# Patient Record
Sex: Female | Born: 1935 | Race: Black or African American | Hispanic: No | Smoking: Never smoker
Health system: Southern US, Community
[De-identification: ages and names within clinical notes are randomized; demographics above are authoritative.]

## PROBLEM LIST (undated history)

## (undated) DIAGNOSIS — I2510 Atherosclerotic heart disease of native coronary artery without angina pectoris: Secondary | ICD-10-CM

## (undated) DIAGNOSIS — I517 Cardiomegaly: Secondary | ICD-10-CM

## (undated) DIAGNOSIS — T4145XA Adverse effect of unspecified anesthetic, initial encounter: Secondary | ICD-10-CM

## (undated) DIAGNOSIS — T8859XA Other complications of anesthesia, initial encounter: Secondary | ICD-10-CM

## (undated) DIAGNOSIS — J45909 Unspecified asthma, uncomplicated: Secondary | ICD-10-CM

## (undated) DIAGNOSIS — R0902 Hypoxemia: Secondary | ICD-10-CM

## (undated) DIAGNOSIS — Z8679 Personal history of other diseases of the circulatory system: Secondary | ICD-10-CM

## (undated) DIAGNOSIS — R011 Cardiac murmur, unspecified: Secondary | ICD-10-CM

## (undated) DIAGNOSIS — M1990 Unspecified osteoarthritis, unspecified site: Secondary | ICD-10-CM

## (undated) DIAGNOSIS — D649 Anemia, unspecified: Secondary | ICD-10-CM

## (undated) DIAGNOSIS — I509 Heart failure, unspecified: Secondary | ICD-10-CM

## (undated) DIAGNOSIS — E039 Hypothyroidism, unspecified: Secondary | ICD-10-CM

## (undated) DIAGNOSIS — Z9289 Personal history of other medical treatment: Secondary | ICD-10-CM

## (undated) DIAGNOSIS — J811 Chronic pulmonary edema: Secondary | ICD-10-CM

## (undated) DIAGNOSIS — K219 Gastro-esophageal reflux disease without esophagitis: Secondary | ICD-10-CM

## (undated) DIAGNOSIS — R0600 Dyspnea, unspecified: Secondary | ICD-10-CM

## (undated) DIAGNOSIS — N189 Chronic kidney disease, unspecified: Secondary | ICD-10-CM

## (undated) DIAGNOSIS — I10 Essential (primary) hypertension: Secondary | ICD-10-CM

## (undated) DIAGNOSIS — C140 Malignant neoplasm of pharynx, unspecified: Secondary | ICD-10-CM

## (undated) DIAGNOSIS — R0602 Shortness of breath: Secondary | ICD-10-CM

## (undated) DIAGNOSIS — I272 Pulmonary hypertension, unspecified: Secondary | ICD-10-CM

## (undated) DIAGNOSIS — I351 Nonrheumatic aortic (valve) insufficiency: Secondary | ICD-10-CM

## (undated) DIAGNOSIS — I639 Cerebral infarction, unspecified: Secondary | ICD-10-CM

## (undated) DIAGNOSIS — J189 Pneumonia, unspecified organism: Secondary | ICD-10-CM

## (undated) DIAGNOSIS — R6510 Systemic inflammatory response syndrome (SIRS) of non-infectious origin without acute organ dysfunction: Secondary | ICD-10-CM

## (undated) HISTORY — DX: Atherosclerotic heart disease of native coronary artery without angina pectoris: I25.10

## (undated) HISTORY — DX: Personal history of other diseases of the circulatory system: Z86.79

## (undated) HISTORY — PX: JOINT REPLACEMENT: SHX530

## (undated) HISTORY — DX: Pulmonary hypertension, unspecified: I27.20

## (undated) HISTORY — DX: Chronic pulmonary edema: J81.1

## (undated) HISTORY — DX: Dyspnea, unspecified: R06.00

## (undated) HISTORY — DX: Shortness of breath: R06.02

## (undated) HISTORY — DX: Cardiomegaly: I51.7

## (undated) HISTORY — DX: Nonrheumatic aortic (valve) insufficiency: I35.1

## (undated) HISTORY — DX: Hypoxemia: R09.02

## (undated) HISTORY — PX: REPLACEMENT TOTAL KNEE: SUR1224

## (undated) HISTORY — DX: Essential (primary) hypertension: I10

## (undated) HISTORY — DX: Heart failure, unspecified: I50.9

## (undated) HISTORY — DX: Hypothyroidism, unspecified: E03.9

## (undated) HISTORY — DX: Cerebral infarction, unspecified: I63.9

---
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
## 1978-10-17 DIAGNOSIS — I639 Cerebral infarction, unspecified: Secondary | ICD-10-CM

## 1978-10-17 HISTORY — PX: KNEE ARTHROSCOPY: SHX127

## 1978-10-17 HISTORY — PX: HAMMER TOE SURGERY: SHX385

## 1978-10-17 HISTORY — DX: Cerebral infarction, unspecified: I63.9

## 1988-10-16 HISTORY — PX: CORONARY ANGIOPLASTY: SHX604

## 1996-02-16 HISTORY — PX: THYROIDECTOMY, PARTIAL: SHX18

## 1998-02-15 DIAGNOSIS — C140 Malignant neoplasm of pharynx, unspecified: Secondary | ICD-10-CM

## 1998-02-15 HISTORY — DX: Malignant neoplasm of pharynx, unspecified: C14.0

## 1998-05-15 ENCOUNTER — Other Ambulatory Visit: Admission: RE | Admit: 1998-05-15 | Discharge: 1998-05-15 | Payer: Self-pay | Admitting: Otolaryngology

## 1998-05-26 ENCOUNTER — Encounter: Admission: RE | Admit: 1998-05-26 | Discharge: 1998-08-24 | Payer: Self-pay | Admitting: Radiation Oncology

## 1998-05-26 ENCOUNTER — Encounter: Payer: Self-pay | Admitting: Radiation Oncology

## 1998-05-27 ENCOUNTER — Encounter (HOSPITAL_COMMUNITY): Admission: RE | Admit: 1998-05-27 | Discharge: 1998-08-25 | Payer: Self-pay | Admitting: Dentistry

## 1998-06-17 ENCOUNTER — Inpatient Hospital Stay (HOSPITAL_COMMUNITY): Admission: EM | Admit: 1998-06-17 | Discharge: 1998-06-20 | Payer: Self-pay | Admitting: Emergency Medicine

## 1998-06-17 ENCOUNTER — Encounter: Payer: Self-pay | Admitting: Emergency Medicine

## 1998-06-19 ENCOUNTER — Encounter (HOSPITAL_BASED_OUTPATIENT_CLINIC_OR_DEPARTMENT_OTHER): Payer: Self-pay | Admitting: Internal Medicine

## 1998-07-13 ENCOUNTER — Emergency Department (HOSPITAL_COMMUNITY): Admission: EM | Admit: 1998-07-13 | Discharge: 1998-07-13 | Payer: Self-pay | Admitting: Emergency Medicine

## 1998-07-13 ENCOUNTER — Encounter: Payer: Self-pay | Admitting: Emergency Medicine

## 1999-01-17 ENCOUNTER — Emergency Department (HOSPITAL_COMMUNITY): Admission: EM | Admit: 1999-01-17 | Discharge: 1999-01-17 | Payer: Self-pay | Admitting: Emergency Medicine

## 1999-01-17 ENCOUNTER — Encounter: Payer: Self-pay | Admitting: Emergency Medicine

## 1999-01-27 ENCOUNTER — Encounter: Admission: RE | Admit: 1999-01-27 | Discharge: 1999-04-27 | Payer: Self-pay | Admitting: Radiation Oncology

## 1999-05-26 ENCOUNTER — Encounter: Admission: RE | Admit: 1999-05-26 | Discharge: 1999-08-24 | Payer: Self-pay | Admitting: Radiation Oncology

## 1999-06-01 ENCOUNTER — Encounter: Payer: Self-pay | Admitting: Endocrinology

## 1999-06-01 ENCOUNTER — Encounter: Admission: RE | Admit: 1999-06-01 | Discharge: 1999-06-01 | Payer: Self-pay | Admitting: Endocrinology

## 2000-12-13 ENCOUNTER — Ambulatory Visit: Admission: RE | Admit: 2000-12-13 | Discharge: 2001-03-13 | Payer: Self-pay | Admitting: Radiation Oncology

## 2001-05-09 ENCOUNTER — Ambulatory Visit: Admission: RE | Admit: 2001-05-09 | Discharge: 2001-08-07 | Payer: Self-pay | Admitting: Radiation Oncology

## 2002-05-08 ENCOUNTER — Ambulatory Visit: Admission: RE | Admit: 2002-05-08 | Discharge: 2002-05-08 | Payer: Self-pay | Admitting: Radiation Oncology

## 2002-09-12 ENCOUNTER — Encounter: Admission: RE | Admit: 2002-09-12 | Discharge: 2002-09-12 | Payer: Self-pay | Admitting: Dentistry

## 2002-11-13 ENCOUNTER — Ambulatory Visit (HOSPITAL_COMMUNITY): Admission: RE | Admit: 2002-11-13 | Discharge: 2002-11-13 | Payer: Self-pay | Admitting: Radiation Oncology

## 2002-11-20 ENCOUNTER — Encounter: Payer: Self-pay | Admitting: Emergency Medicine

## 2002-11-20 ENCOUNTER — Emergency Department (HOSPITAL_COMMUNITY): Admission: EM | Admit: 2002-11-20 | Discharge: 2002-11-20 | Payer: Self-pay | Admitting: Emergency Medicine

## 2003-02-16 HISTORY — PX: CATARACT EXTRACTION W/ INTRAOCULAR LENS IMPLANT: SHX1309

## 2003-05-14 ENCOUNTER — Ambulatory Visit: Admission: RE | Admit: 2003-05-14 | Discharge: 2003-05-14 | Payer: Self-pay | Admitting: Radiation Oncology

## 2003-11-12 ENCOUNTER — Ambulatory Visit: Admission: RE | Admit: 2003-11-12 | Discharge: 2003-11-12 | Payer: Self-pay | Admitting: Radiation Oncology

## 2003-11-19 ENCOUNTER — Ambulatory Visit: Admission: RE | Admit: 2003-11-19 | Discharge: 2003-11-19 | Payer: Self-pay | Admitting: Radiation Oncology

## 2004-03-16 ENCOUNTER — Emergency Department (HOSPITAL_COMMUNITY): Admission: EM | Admit: 2004-03-16 | Discharge: 2004-03-16 | Payer: Self-pay | Admitting: Emergency Medicine

## 2004-11-18 ENCOUNTER — Encounter: Admission: RE | Admit: 2004-11-18 | Discharge: 2004-11-18 | Payer: Self-pay | Admitting: Family Medicine

## 2005-02-25 ENCOUNTER — Encounter: Admission: RE | Admit: 2005-02-25 | Discharge: 2005-02-25 | Payer: Self-pay | Admitting: *Deleted

## 2005-08-20 ENCOUNTER — Emergency Department (HOSPITAL_COMMUNITY): Admission: EM | Admit: 2005-08-20 | Discharge: 2005-08-20 | Payer: Self-pay | Admitting: Emergency Medicine

## 2005-09-05 ENCOUNTER — Emergency Department (HOSPITAL_COMMUNITY): Admission: EM | Admit: 2005-09-05 | Discharge: 2005-09-05 | Payer: Self-pay | Admitting: Emergency Medicine

## 2005-09-07 ENCOUNTER — Emergency Department (HOSPITAL_COMMUNITY): Admission: EM | Admit: 2005-09-07 | Discharge: 2005-09-07 | Payer: Self-pay | Admitting: Emergency Medicine

## 2005-09-10 ENCOUNTER — Ambulatory Visit (HOSPITAL_BASED_OUTPATIENT_CLINIC_OR_DEPARTMENT_OTHER): Admission: RE | Admit: 2005-09-10 | Discharge: 2005-09-10 | Payer: Self-pay | Admitting: Otolaryngology

## 2005-12-07 ENCOUNTER — Inpatient Hospital Stay (HOSPITAL_COMMUNITY): Admission: EM | Admit: 2005-12-07 | Discharge: 2005-12-09 | Payer: Self-pay | Admitting: Emergency Medicine

## 2005-12-07 ENCOUNTER — Encounter (INDEPENDENT_AMBULATORY_CARE_PROVIDER_SITE_OTHER): Payer: Self-pay | Admitting: Cardiology

## 2005-12-07 ENCOUNTER — Encounter: Payer: Self-pay | Admitting: Vascular Surgery

## 2006-09-14 ENCOUNTER — Observation Stay (HOSPITAL_COMMUNITY): Admission: EM | Admit: 2006-09-14 | Discharge: 2006-09-15 | Payer: Self-pay | Admitting: Emergency Medicine

## 2006-11-11 ENCOUNTER — Ambulatory Visit: Payer: Self-pay | Admitting: Pulmonary Disease

## 2006-11-16 DIAGNOSIS — Z8679 Personal history of other diseases of the circulatory system: Secondary | ICD-10-CM | POA: Insufficient documentation

## 2006-11-16 DIAGNOSIS — I10 Essential (primary) hypertension: Secondary | ICD-10-CM | POA: Insufficient documentation

## 2006-11-16 DIAGNOSIS — J309 Allergic rhinitis, unspecified: Secondary | ICD-10-CM | POA: Insufficient documentation

## 2006-11-16 DIAGNOSIS — I499 Cardiac arrhythmia, unspecified: Secondary | ICD-10-CM | POA: Insufficient documentation

## 2006-11-16 DIAGNOSIS — C801 Malignant (primary) neoplasm, unspecified: Secondary | ICD-10-CM

## 2006-12-02 ENCOUNTER — Ambulatory Visit: Payer: Self-pay | Admitting: Pulmonary Disease

## 2009-09-09 ENCOUNTER — Inpatient Hospital Stay (HOSPITAL_COMMUNITY): Admission: EM | Admit: 2009-09-09 | Discharge: 2009-09-12 | Payer: Self-pay | Admitting: Emergency Medicine

## 2009-09-11 ENCOUNTER — Encounter (INDEPENDENT_AMBULATORY_CARE_PROVIDER_SITE_OTHER): Payer: Self-pay | Admitting: Internal Medicine

## 2009-09-11 ENCOUNTER — Ambulatory Visit: Payer: Self-pay | Admitting: Internal Medicine

## 2009-09-15 ENCOUNTER — Ambulatory Visit: Payer: Self-pay | Admitting: Internal Medicine

## 2009-09-15 DIAGNOSIS — K219 Gastro-esophageal reflux disease without esophagitis: Secondary | ICD-10-CM

## 2009-09-15 DIAGNOSIS — K222 Esophageal obstruction: Secondary | ICD-10-CM

## 2009-09-15 DIAGNOSIS — I6350 Cerebral infarction due to unspecified occlusion or stenosis of unspecified cerebral artery: Secondary | ICD-10-CM | POA: Insufficient documentation

## 2009-09-15 DIAGNOSIS — E039 Hypothyroidism, unspecified: Secondary | ICD-10-CM | POA: Insufficient documentation

## 2009-09-15 DIAGNOSIS — I509 Heart failure, unspecified: Secondary | ICD-10-CM | POA: Insufficient documentation

## 2009-09-15 DIAGNOSIS — R1319 Other dysphagia: Secondary | ICD-10-CM

## 2009-09-15 DIAGNOSIS — I2510 Atherosclerotic heart disease of native coronary artery without angina pectoris: Secondary | ICD-10-CM | POA: Insufficient documentation

## 2009-09-15 DIAGNOSIS — K5730 Diverticulosis of large intestine without perforation or abscess without bleeding: Secondary | ICD-10-CM | POA: Insufficient documentation

## 2009-09-29 ENCOUNTER — Ambulatory Visit: Payer: Self-pay | Admitting: Cardiovascular Disease

## 2009-12-12 ENCOUNTER — Encounter (INDEPENDENT_AMBULATORY_CARE_PROVIDER_SITE_OTHER): Payer: Self-pay | Admitting: *Deleted

## 2009-12-12 ENCOUNTER — Ambulatory Visit: Payer: Self-pay | Admitting: Cardiovascular Disease

## 2010-01-29 ENCOUNTER — Emergency Department (HOSPITAL_COMMUNITY)
Admission: EM | Admit: 2010-01-29 | Discharge: 2010-01-29 | Payer: Self-pay | Source: Home / Self Care | Admitting: Emergency Medicine

## 2010-02-17 ENCOUNTER — Inpatient Hospital Stay (HOSPITAL_COMMUNITY)
Admission: EM | Admit: 2010-02-17 | Discharge: 2010-02-21 | Payer: Self-pay | Source: Home / Self Care | Attending: Internal Medicine | Admitting: Internal Medicine

## 2010-02-18 ENCOUNTER — Ambulatory Visit: Payer: Self-pay | Admitting: Cardiology

## 2010-02-18 LAB — BASIC METABOLIC PANEL
BUN: 10 mg/dL (ref 6–23)
BUN: 15 mg/dL (ref 6–23)
CO2: 35 mEq/L — ABNORMAL HIGH (ref 19–32)
CO2: 33 mEq/L — ABNORMAL HIGH (ref 19–32)
Calcium: 9.4 mg/dL (ref 8.4–10.5)
Calcium: 9.4 mg/dL (ref 8.4–10.5)
Chloride: 99 mEq/L (ref 96–112)
Chloride: 94 mEq/L — ABNORMAL LOW (ref 96–112)
Creatinine, Ser: 0.96 mg/dL (ref 0.4–1.2)
Creatinine, Ser: 1.29 mg/dL — ABNORMAL HIGH (ref 0.4–1.2)
GFR calc Af Amer: >60 mL/min (ref >60)
GFR calc Af Amer: 47 mL/min — ABNORMAL LOW (ref >60)
GFR calc non Af Amer: 54 mL/min — ABNORMAL LOW (ref >60)
GFR calc non Af Amer: 39 mL/min — ABNORMAL LOW (ref >60)
Glucose, Bld: 83 mg/dL (ref 70–99)
Glucose, Bld: 111 mg/dL — ABNORMAL HIGH (ref 70–99)
Potassium: 3.8 mEq/L (ref 3.5–5.1)
Potassium: 3.7 mEq/L (ref 3.5–5.1)
Sodium: 142 mEq/L (ref 135–145)
Sodium: 138 mEq/L (ref 135–145)

## 2010-02-18 LAB — CARDIAC PANEL(CRET KIN+CKTOT+MB+TROPI)
CK, MB: 1.4 ng/mL (ref 0.3–4.0)
CK, MB: 1.1 ng/mL (ref 0.3–4.0)
Relative Index: INVALID (ref 0.0–2.5)
Relative Index: INVALID (ref 0.0–2.5)
Total CK: 84 U/L (ref 7–177)
Total CK: 76 U/L (ref 7–177)
Troponin I: 0.04 ng/mL (ref 0.00–0.06)
Troponin I: 0.03 ng/mL (ref 0.00–0.06)

## 2010-02-18 LAB — PHOSPHORUS: Phosphorus: 3 mg/dL (ref 2.3–4.6)

## 2010-02-18 LAB — MAGNESIUM: Magnesium: 2 mg/dL (ref 1.5–2.5)

## 2010-02-19 LAB — BASIC METABOLIC PANEL
BUN: 16 mg/dL (ref 6–23)
CO2: 35 mEq/L — ABNORMAL HIGH (ref 19–32)
Calcium: 9.1 mg/dL (ref 8.4–10.5)
Chloride: 98 mEq/L (ref 96–112)
Creatinine, Ser: 1.42 mg/dL — ABNORMAL HIGH (ref 0.4–1.2)
GFR calc Af Amer: 42 mL/min — ABNORMAL LOW (ref >60)
GFR calc non Af Amer: 35 mL/min — ABNORMAL LOW (ref >60)
Glucose, Bld: 86 mg/dL (ref 70–99)
Potassium: 3.6 mEq/L (ref 3.5–5.1)
Sodium: 140 mEq/L (ref 135–145)

## 2010-02-19 LAB — BRAIN NATRIURETIC PEPTIDE: Pro B Natriuretic peptide (BNP): 541 pg/mL — ABNORMAL HIGH (ref 0.0–100.0)

## 2010-02-19 LAB — CBC
HCT: 27.5 % — ABNORMAL LOW (ref 36.0–46.0)
Hemoglobin: 8.6 g/dL — ABNORMAL LOW (ref 12.0–15.0)
MCH: 27.7 pg (ref 26.0–34.0)
MCHC: 31.3 g/dL (ref 30.0–36.0)
MCV: 88.7 fL (ref 78.0–100.0)
Platelets: 239 10*3/uL (ref 150–400)
RBC: 3.1 MIL/uL — ABNORMAL LOW (ref 3.87–5.11)
RDW: 14 % (ref 11.5–15.5)
WBC: 6.7 10*3/uL (ref 4.0–10.5)

## 2010-02-20 LAB — BASIC METABOLIC PANEL
BUN: 17 mg/dL (ref 6–23)
CO2: 35 mEq/L — ABNORMAL HIGH (ref 19–32)
Calcium: 9.4 mg/dL (ref 8.4–10.5)
Chloride: 97 mEq/L (ref 96–112)
Creatinine, Ser: 1.22 mg/dL — ABNORMAL HIGH (ref 0.4–1.2)
GFR calc Af Amer: 50 mL/min — ABNORMAL LOW (ref >60)
GFR calc non Af Amer: 41 mL/min — ABNORMAL LOW (ref >60)
Glucose, Bld: 113 mg/dL — ABNORMAL HIGH (ref 70–99)
Potassium: 3.8 mEq/L (ref 3.5–5.1)
Sodium: 141 mEq/L (ref 135–145)

## 2010-03-02 LAB — BASIC METABOLIC PANEL
BUN: 21 mg/dL (ref 6–23)
CO2: 32 mEq/L (ref 19–32)
Calcium: 8.9 mg/dL (ref 8.4–10.5)
Chloride: 97 mEq/L (ref 96–112)
Creatinine, Ser: 1.2 mg/dL (ref 0.4–1.2)
GFR calc Af Amer: 51 mL/min — ABNORMAL LOW (ref >60)
GFR calc non Af Amer: 42 mL/min — ABNORMAL LOW (ref >60)
Glucose, Bld: 74 mg/dL (ref 70–99)
Potassium: 3.6 mEq/L (ref 3.5–5.1)
Sodium: 136 mEq/L (ref 135–145)

## 2010-03-17 ENCOUNTER — Ambulatory Visit: Payer: Self-pay | Admitting: Cardiovascular Disease

## 2010-03-19 NOTE — Miscellaneous (Signed)
Summary: dx correction  Clinical Lists Changes  Problems: Changed problem from CONGESTIVE HEART FAILURE (ICD-428.00) to CONGESTIVE HEART FAILURE (ICD-428.0) changed the incorrect dx code to correct dx code Genella Mech  December 12, 2009 1:47 PM

## 2010-03-19 NOTE — Assessment & Plan Note (Signed)
Summary: post. hosp/ poss. dil.   History of Present Illness Visit Type: new patient  Primary GI MD: Yancey Flemings MD Primary Provider: Lupe Carney, MD  Requesting Provider: na Chief Complaint: Dysphagia, GERD, belching, loss of appetite, nausea, change in bowel habits, and diarrhea  History of Present Illness:   77 Y.O FEMALE WHO IS NEW TO GI. SHE WAS SEEN IN CONSULTATION WHILE HOSPITALIZED LAST WEEK AT [facility]. SHE WAS ADMITTED WITH DYSPNEA AND TREATED FOR CHF,PNEUMONIA, AND HAS NEW DX OF PULMONARY HTN.. SHE HAS A REMOTE HX OF NASOPHARNGEAL CANCER 12 YEARS AGO FOR WHICH SHE HAD RADIATION TO HER FACE AND NECK. SHE HAS HAD DYSPHAGIA SXS FOR SEVERAL YEARS,PROGRESSIVE OVER THE PAST COUPLE YEARS. SXS ARE NOTICEABLE WITH PILLS  AND MEATS PARTICULARLY. NO DIFFICULTY WITH LIQUIDS UNLESS SHE DRINKS LIQUDS TO TRY TO PUSH SOME SOLIDS DOWN-WILL GURGLE BACK UP. INFREQUENT HEARTBURN INDIGESTION. NO C/O ABDOMINAL PAIN,APPETITE OK.  SHE HAS DAILY SXS,NOT EVERY MEAL. SHE HAD A BARIUM SWALLOW DONE 7/28 WHICH SHOWED A DISTAL ESOPHAGEAL STRICTURE,BARIUM TABLET DID NOT SPONTANEOUSLY PASS.  SHE WAS ANTICOAGULATED DURING HER STAY AND ON O2 SO ELECTED NOT TO PURSUE DILATION LAST WEEK. SHE WAS DISCHARGED 09/12/09. SHE IS STILL ON 2L/O2 ,AND FINISHING HER ANTIBIOTICS. NOT TAKING CLINDAMYCIN  REGULARLY AS IT GETS STUCK IN ESOPHAGUS.   GI Review of Systems    Reports acid reflux and  dysphagia with solids.      Denies abdominal pain, belching, bloating, chest pain, dysphagia with liquids, heartburn, loss of appetite, nausea, vomiting, vomiting blood, and  weight loss.        Denies anal fissure, black tarry stools, change in bowel habit, constipation, diarrhea, diverticulosis, fecal incontinence, heme positive stool, hemorrhoids, irritable bowel syndrome, jaundice, light color stool, liver problems, rectal bleeding, and  rectal pain.    Current Medications (verified): 1)  Synthroid 175 Mcg Tabs (Levothyroxine Sodium) ....  One Tablet By Mouth Once Daily 2)  Temazepam 30 Mg  Caps (Temazepam) .... Take 1 Capsule By Mouth Once A Day 3)  Avelox 400 Mg Tabs (Moxifloxacin Hcl) .... One Tablet By Mouth Once Daily For Five Days 4)  Aspirin 81 Mg Chew (Aspirin) .... One By Mouth Once Daily 5)  Clindamycin Hcl 300 Mg Caps (Clindamycin Hcl) .... One Tablet By Mouth Every Eight Hours For Five Days 6)  Furosemide 40 Mg Tabs (Furosemide) .... One Tablet By Mouth Once Daily 7)  Lisinopril 5 Mg Tabs (Lisinopril) .... One Tablet By Mouth Once Daily For One Month 8)  Aleve 220 Mg Tabs (Naproxen Sodium) .... Two Tablets By Mouth Once Daily in The Morning 9)  Afrin Nasal Spray 0.05 % Soln (Oxymetazoline Hcl) .... Two Times A Day As Needed 10)  Metoprolol Tartrate 25 Mg Tabs (Metoprolol Tartrate) .... 1/2 Tablet By Mouth Two Times A Day 11)  Alprazolam 0.5 Mg Tabs (Alprazolam) .... 1/2 To 1 Tablet By Mouth Three Times A Day 12)  Nifedical Xl 60 Mg Xr24h-Tab (Nifedipine) .... One Tablet By Mouth Once Daily 13)  Multivitamins   Tabs (Multiple Vitamin) .... One Tablet By Mouth Once Daily  Allergies (verified): 1)  ! Percocet 2)  * Lovenox 3)  Percocet 4)  * Anesthesia 5)  * Red Dye  Past History:  Past Medical History: Allergic rhinitis Hypertension Cerebrovascular accident, hx of CAD Anemia Congestive Heart Failure/PULM HTN Colon Polyps Diverticulosis Esophageal Stricture DX 7/11 Thyroid Disease  Pneumonia Seizures HX OF NASOPHARYNGEAL CANCER 2000- S/P RADIATION  Past Surgical History: Angioplasty/stent Knee Surgery  Foot Surgery   Family History: Family History of Colon Cancer:Brother, Maternal Nephew  Family History of Heart Disease: Mother   Social History: Retired Widowed 10 Childern  Patient has never smoked.  Alcohol Use - no Illicit Drug Use - no Drug Use:  no  Review of Systems       The patient complains of anxiety-new, arthritis/joint pain, cough, fainting, fatigue, headaches-new, hearing  problems, heart rhythm changes, itching, shortness of breath, skin rash, sleeping problems, swelling of feet/legs, urination - excessive, urine leakage, and voice change.  The patient denies allergy/sinus, anemia, back pain, blood in urine, breast changes/lumps, change in vision, confusion, coughing up blood, depression-new, fever, heart murmur, menstrual pain, muscle pains/cramps, night sweats, nosebleeds, pregnancy symptoms, sore throat, swollen lymph glands, thirst - excessive, urination changes/pain, and vision changes.         ROS OTHERWISE AS IN HPI  Vital Signs:  Patient profile:   77 year old female Height:      62 inches Weight:      143 pounds BMI:     26.25 BSA:     1.66 Pulse rate:   80 / minute Pulse rhythm:   regular BP sitting:   132 / 80  (left arm) Cuff size:   regular  Vitals Entered By: Ok Anis CMA (September 15, 2009 2:29 PM)  Physical Exam  General:  Well developed, well nourished, no acute distress.,ELDERLY Head:  Normocephalic and atraumatic. Eyes:  PERRLA, no icterus. Neck:  Supple; no masses or thyromegaly. Lungs:  Clear throughout to auscultation. Heart:  Regular rate and rhythm; no murmurs, rubs,  or bruits.heart murmur systolic:.   Abdomen:  SOFT, NONTENDER, NO MASS OR HSM,BS+ Rectal:  NOT DONE Extremities:  No clubbing, cyanosis, edema or deformities noted. Neurologic:  Alert and  oriented x4;  grossly normal neurologically. Psych:  Alert and cooperative. Normal mood and affect.   Impression & Recommendations:  Problem # 1:  ESOPHAGEAL STRICTURE (ICD-530.3) Assessment New 77 Y.O FEMALE  WITH SOLID FOOD DYSPHAGIA, AND NEWLY DX  DISTAL ESOPHAGEAL STRICTURE.   SHE WILL NEED EGD WITH SAVARY DILATION OF ESOPHAGUS WITH DR. PERRY-HOWEVER SHE WAS JUST DISCHARGED FROM THE HOSPITAL 09/12/09 AND IS RECOVERING FROM PNEUMONIA AND CHF (STILL ON 02).WILL REASSESS IN 3-4 WEEKS BY PHONE AND IF IMPROVED AT THAT TIME THEN SCHEDULE FOR PROCEDURE.   ADD TRIAL OF  ACIPHEX 20 MG DAILY FOR REFLUX SXS,MAY HELP IF ANY COMPONENT OF ESOPHAGITIS CONTRIBUTING TO HER SXS. SAMPLES TODAY.  Problem # 2:  CVA-STROKE (ICD-434.91) Assessment: Comment Only  Problem # 3:  CONGESTIVE HEART FAILURE (ICD-428.00) Assessment: Comment Only  Problem # 4:  CORONARY ARTERY DISEASE (ICD-414.00) Assessment: Comment Only  Problem # 5:  DIVERTICULOSIS-COLON (ICD-562.10) Assessment: Comment Only  Problem # 6:  CARCINOMA (ICD-199.1) Assessment: Comment Only HX OF NASOPHARYNGEAL CANCER-2000'-  S/P RADIATION  Patient Instructions: 1)  We will call you when we have the Endoscopy scheduled at  the hospital. 2)  Take 1 tab of Aciphex in the morning. We will call you when we have more samples.  3)  Copy sent to : Dr. Lupe Carney 4)  The medication list was reviewed and reconciled.  All changed / newly prescribed medications were explained.  A complete medication list was provided to the patient / caregiver.

## 2010-04-27 LAB — DIFFERENTIAL
Basophils Absolute: 0 10*3/uL (ref 0.0–0.1)
Basophils Relative: 1 % (ref 0–1)
Eosinophils Absolute: 0.2 10*3/uL (ref 0.0–0.7)
Eosinophils Relative: 3 % (ref 0–5)
Lymphocytes Relative: 12 % (ref 12–46)
Lymphs Abs: 0.8 10*3/uL (ref 0.7–4.0)
Monocytes Absolute: 0.4 10*3/uL (ref 0.1–1.0)
Monocytes Relative: 7 % (ref 3–12)
Neutro Abs: 5.1 10*3/uL (ref 1.7–7.7)
Neutrophils Relative %: 78 % — ABNORMAL HIGH (ref 43–77)

## 2010-04-27 LAB — CBC
HCT: 29.5 % — ABNORMAL LOW (ref 36.0–46.0)
Hemoglobin: 9 g/dL — ABNORMAL LOW (ref 12.0–15.0)
MCH: 27.7 pg (ref 26.0–34.0)
MCHC: 30.5 g/dL (ref 30.0–36.0)
MCV: 90.8 fL (ref 78.0–100.0)
Platelets: 249 10*3/uL (ref 150–400)
Platelets: 264 10*3/uL (ref 150–400)
RBC: 3.25 MIL/uL — ABNORMAL LOW (ref 3.87–5.11)
RBC: 3.66 MIL/uL — ABNORMAL LOW (ref 3.87–5.11)
RDW: 13.7 % (ref 11.5–15.5)
RDW: 13.8 % (ref 11.5–15.5)
WBC: 6.6 10*3/uL (ref 4.0–10.5)
WBC: 6.7 10*3/uL (ref 4.0–10.5)

## 2010-04-27 LAB — URINALYSIS, ROUTINE W REFLEX MICROSCOPIC
Bilirubin Urine: NEGATIVE
Glucose, UA: NEGATIVE mg/dL
Hgb urine dipstick: NEGATIVE
Hgb urine dipstick: NEGATIVE
Ketones, ur: NEGATIVE mg/dL
Nitrite: NEGATIVE
Nitrite: NEGATIVE
Protein, ur: NEGATIVE mg/dL
Specific Gravity, Urine: 1.008 (ref 1.005–1.030)
Specific Gravity, Urine: 1.01 (ref 1.005–1.030)
Urobilinogen, UA: 0.2 mg/dL (ref 0.0–1.0)
Urobilinogen, UA: 0.2 mg/dL (ref 0.0–1.0)
pH: 7 (ref 5.0–8.0)

## 2010-04-27 LAB — COMPREHENSIVE METABOLIC PANEL
ALT: 17 U/L (ref 0–35)
AST: 24 U/L (ref 0–37)
AST: 25 U/L (ref 0–37)
Albumin: 3.5 g/dL (ref 3.5–5.2)
Albumin: 3.6 g/dL (ref 3.5–5.2)
Alkaline Phosphatase: 86 U/L (ref 39–117)
Alkaline Phosphatase: 91 U/L (ref 39–117)
BUN: 12 mg/dL (ref 6–23)
CO2: 28 mEq/L (ref 19–32)
Calcium: 9.5 mg/dL (ref 8.4–10.5)
Chloride: 103 mEq/L (ref 96–112)
Chloride: 104 mEq/L (ref 96–112)
Creatinine, Ser: 0.9 mg/dL (ref 0.4–1.2)
GFR calc Af Amer: 54 mL/min — ABNORMAL LOW (ref >60)
GFR calc Af Amer: >60 mL/min (ref >60)
GFR calc non Af Amer: 59 mL/min — ABNORMAL LOW (ref >60)
Glucose, Bld: 105 mg/dL — ABNORMAL HIGH (ref 70–99)
Potassium: 3.9 mEq/L (ref 3.5–5.1)
Potassium: 4.1 mEq/L (ref 3.5–5.1)
Sodium: 140 mEq/L (ref 135–145)
Total Bilirubin: 0.3 mg/dL (ref 0.3–1.2)
Total Bilirubin: 0.6 mg/dL (ref 0.3–1.2)
Total Protein: 7.8 g/dL (ref 6.0–8.3)
Total Protein: 7.8 g/dL (ref 6.0–8.3)

## 2010-04-27 LAB — TYPE AND SCREEN

## 2010-04-27 LAB — POCT CARDIAC MARKERS
CKMB, poc: 1.5 ng/mL (ref 1.0–8.0)
Myoglobin, poc: 130 ng/mL (ref 12–200)
Troponin i, poc: <0.05 ng/mL (ref 0.00–0.09)

## 2010-04-27 LAB — MRSA PCR SCREENING: MRSA by PCR: POSITIVE — AB

## 2010-04-27 LAB — TROPONIN I: Troponin I: 0.03 ng/mL (ref 0.00–0.06)

## 2010-04-27 LAB — CK TOTAL AND CKMB (NOT AT ARMC)
CK, MB: 1.5 ng/mL (ref 0.3–4.0)
Relative Index: INVALID (ref 0.0–2.5)
Total CK: 92 U/L (ref 7–177)

## 2010-04-27 LAB — PROTIME-INR
INR: 0.97 (ref 0.00–1.49)
Prothrombin Time: 13.1 seconds (ref 11.6–15.2)

## 2010-04-27 LAB — BRAIN NATRIURETIC PEPTIDE: Pro B Natriuretic peptide (BNP): 1110 pg/mL — ABNORMAL HIGH (ref 0.0–100.0)

## 2010-05-02 LAB — BASIC METABOLIC PANEL
CO2: 31 mEq/L (ref 19–32)
Calcium: 9 mg/dL (ref 8.4–10.5)
Chloride: 103 mEq/L (ref 96–112)
Creatinine, Ser: 1.12 mg/dL (ref 0.4–1.2)
Glucose, Bld: 99 mg/dL (ref 70–99)

## 2010-05-02 LAB — URINE CULTURE: Culture: NO GROWTH

## 2010-05-02 LAB — COMPREHENSIVE METABOLIC PANEL
ALT: 10 U/L (ref 0–35)
ALT: 11 U/L (ref 0–35)
Alkaline Phosphatase: 67 U/L (ref 39–117)
Alkaline Phosphatase: 73 U/L (ref 39–117)
BUN: 11 mg/dL (ref 6–23)
BUN: 13 mg/dL (ref 6–23)
CO2: 26 mEq/L (ref 19–32)
CO2: 30 mEq/L (ref 19–32)
Chloride: 100 mEq/L (ref 96–112)
Chloride: 108 mEq/L (ref 96–112)
GFR calc non Af Amer: 47 mL/min — ABNORMAL LOW (ref >60)
GFR calc non Af Amer: 54 mL/min — ABNORMAL LOW (ref >60)
Glucose, Bld: 78 mg/dL (ref 70–99)
Glucose, Bld: 92 mg/dL (ref 70–99)
Potassium: 3.4 mEq/L — ABNORMAL LOW (ref 3.5–5.1)
Potassium: 3.9 mEq/L (ref 3.5–5.1)
Sodium: 141 mEq/L (ref 135–145)
Total Bilirubin: 0.5 mg/dL (ref 0.3–1.2)
Total Bilirubin: 0.6 mg/dL (ref 0.3–1.2)
Total Protein: 7 g/dL (ref 6.0–8.3)

## 2010-05-02 LAB — CARDIAC PANEL(CRET KIN+CKTOT+MB+TROPI)
CK, MB: 2.1 ng/mL (ref 0.3–4.0)
Relative Index: 2.3 (ref 0.0–2.5)
Relative Index: INVALID (ref 0.0–2.5)
Relative Index: INVALID (ref 0.0–2.5)
Troponin I: 0.1 ng/mL — ABNORMAL HIGH (ref 0.00–0.06)
Troponin I: 0.13 ng/mL — ABNORMAL HIGH (ref 0.00–0.06)
Troponin I: 0.14 ng/mL — ABNORMAL HIGH (ref 0.00–0.06)

## 2010-05-02 LAB — CBC
HCT: 30.6 % — ABNORMAL LOW (ref 36.0–46.0)
HCT: 31.1 % — ABNORMAL LOW (ref 36.0–46.0)
Hemoglobin: 10.1 g/dL — ABNORMAL LOW (ref 12.0–15.0)
Hemoglobin: 10.3 g/dL — ABNORMAL LOW (ref 12.0–15.0)
MCV: 91.5 fL (ref 78.0–100.0)
MCV: 91.7 fL (ref 78.0–100.0)
RDW: 14.4 % (ref 11.5–15.5)
RDW: 14.7 % (ref 11.5–15.5)
WBC: 5.7 10*3/uL (ref 4.0–10.5)
WBC: 6.1 10*3/uL (ref 4.0–10.5)

## 2010-05-02 LAB — URINALYSIS, ROUTINE W REFLEX MICROSCOPIC
Bilirubin Urine: NEGATIVE
Ketones, ur: NEGATIVE mg/dL
Leukocytes, UA: NEGATIVE
Nitrite: NEGATIVE
Specific Gravity, Urine: 1.012 (ref 1.005–1.030)
Urobilinogen, UA: 0.2 mg/dL (ref 0.0–1.0)
pH: 5.5 (ref 5.0–8.0)

## 2010-05-02 LAB — POCT CARDIAC MARKERS
CKMB, poc: 1.2 ng/mL (ref 1.0–8.0)
Myoglobin, poc: 127 ng/mL (ref 12–200)

## 2010-05-02 LAB — BLOOD GAS, ARTERIAL
Acid-Base Excess: 5.3 mmol/L — ABNORMAL HIGH (ref 0.0–2.0)
Drawn by: 299251
O2 Content: 3 L/min
O2 Saturation: 99.4 %
TCO2: 30.7 mmol/L (ref 0–100)
pO2, Arterial: 92.8 mmHg (ref 80.0–100.0)

## 2010-05-02 LAB — LIPID PANEL
HDL: 69 mg/dL (ref >39)
Total CHOL/HDL Ratio: 2.2 RATIO
VLDL: 12 mg/dL (ref 0–40)

## 2010-05-02 LAB — DIFFERENTIAL
Basophils Absolute: 0 10*3/uL (ref 0.0–0.1)
Basophils Relative: 0 % (ref 0–1)
Eosinophils Absolute: 0 10*3/uL (ref 0.0–0.7)
Monocytes Relative: 9 % (ref 3–12)
Neutro Abs: 4.6 10*3/uL (ref 1.7–7.7)
Neutrophils Relative %: 81 % — ABNORMAL HIGH (ref 43–77)

## 2010-05-02 LAB — BRAIN NATRIURETIC PEPTIDE
Pro B Natriuretic peptide (BNP): 1807 pg/mL — ABNORMAL HIGH (ref 0.0–100.0)
Pro B Natriuretic peptide (BNP): 716 pg/mL — ABNORMAL HIGH (ref 0.0–100.0)

## 2010-05-02 LAB — D-DIMER, QUANTITATIVE: D-Dimer, Quant: 1.22 ug/mL-FEU — ABNORMAL HIGH (ref 0.00–0.48)

## 2010-05-02 LAB — URINE MICROSCOPIC-ADD ON

## 2010-05-02 LAB — C-REACTIVE PROTEIN: CRP: 14.7 mg/dL — ABNORMAL HIGH (ref <0.6)

## 2010-06-05 ENCOUNTER — Encounter: Payer: Self-pay | Admitting: Cardiovascular Disease

## 2010-06-30 NOTE — Discharge Summary (Signed)
[name], [name]                ACCOUNT NO.:  [id number]   MEDICAL RECORD NO.:  [id number]          PATIENT TYPE:  INP   LOCATION:  4742                         FACILITY:  [facility]   PHYSICIAN:  [name], MDDATE OF BIRTH:  [birth year]   DATE OF ADMISSION:  09/14/2006  DATE OF DISCHARGE:  09/15/2006                               DISCHARGE SUMMARY   The primary care [name] is Dr. Lupe [name].   DISCHARGE DIAGNOSES INCLUDES:  1. Dizziness secondary to significant bradycardia.  2. Bradycardia.  3. History of hypertension.   DISCHARGE MEDICATIONS INCLUDE:  Imdur 30 mg p.o. once daily, Cardizem,  Restoril. (Please hold atenolol for now).   CONSULTATIONS DONE:  None, nil.   BRIEF HISTORY AND HOSPITAL COURSE:  Please refer to the H&P done on September 14, 2006.  The patient is an 77 year old female with past medical  history significant for hypertension, cancer of the oropharynx, CVA,  status post knee replacement.  The patient presented to the hospital  with dizziness.  On presentation, the EKG done revealed significant  sinus bradycardia.  The patient's heart rate was in the 30s.  On further  questioning, it was found that the patient was on Cardizem and atenolol.  The patient missed a dose of usual medications and took a doubled dose  on next occasion.   The patient was admitted to telemetry floor.  Atenolol was held.  The  patient's heart rate has gone up to 60s.  The patient was started on  Imdur 30 mg p.o. once daily.  The blood pressures were controlled.  Dizziness has also resolved.  The patient has done well and is back to  her baseline.  She will be discharged home on Imdur 30 mg p.o. once  daily as well as above meds.  Atenolol will be on hold until the patient  is seen by the primary care [name] in a week.   DISCHARGE PLANS:  1. Discharge the patient on above meds.  2. Hold atenolol for now.  3. Follow up with the primary care [name], Dr. Lupe [name], in  a      week.  4. Cardiac diet.  5. Activity as tolerated.      Kelly Chapel, MD  Electronically Signed     SIO/MEDQ  D:  09/15/2006  T:  09/15/2006  Job:  [id number]   cc:   L. [name], M.D.

## 2010-06-30 NOTE — Assessment & Plan Note (Signed)
[facility]                             PULMONARY OFFICE NOTE   SAIA, DEROSSETT                       MRN:          [id number]  DATE:12/02/2006                            DOB:          December 13, 1915    SUBJECTIVE:  [name] comes in for follow up of her upper airway  congestion and cough. At her last visit, this was felt mostly due to  more upper airway dysfunction related to post-nasal drip and reflux  disease. The patient was started on Benadryl at bedtime, as well as  b.i.d. Prilosec. She comes in today where the cough has significantly  improved. It has not totally resolved, but it is much better. Her only  complaint today is swelling under her right mandible near the bottom of  her ear. It is not overly painful.   PHYSICAL EXAMINATION:  GENERAL:  She is a well developed white female in  no acute distress.  VITAL SIGNS:  Blood pressure 184/120 in the right arm, pulse elevated at  124 probably from walking in the waiting room, but it is down to 96  after she has rested once I came into the room. O2 saturation on room  air is 97%. There is a knot in her right submandibular area at the angle  of the jaw.   IMPRESSION:  1. Cough secondary to upper airway dysfunction related to post-nasal      drip and reflux. It is much improved since being on medications.  2. Probable sialolithiasis. The patient has a history of stones in the      past that were removed by her dentist. I have her asked to try      eating very sour things such as lemon balls, pickles, etcetera to      see if contraction of the salivary gland will extrude the stone. If      it does not, she will need to see ENT for further treatment. She      has seen Dr. Narda Bonds in the past.   PLAN:  1. Continue on b.i.d. Prilosec and Benadryl nightly for the next 2      weeks. If she continues to do well, I would change the Benadryl to      p.r.n. and decrease the Prilosec to 1 daily.  2. The patient will follow up on a p.r.n. basis.     Barbaraann Share, MD,FCCP  Electronically Signed    [facility]/MedQ  DD: 12/02/2006  DT: 12/04/2006  Job #: [id number]   cc:   L. Lupe Carney, M.D.

## 2010-06-30 NOTE — Assessment & Plan Note (Signed)
[facility]                             PULMONARY OFFICE NOTE   [name], [name]                       MRN:          [id number]  DATE:11/11/2006                            DOB:          1915/04/07    REFERRING PHYSICIAN:  self   HISTORY OF PRESENT ILLNESS:  The patient is a very pleasant 77 year old  black female who I have been asked to see for dyspnea and cough.  The  patient states that she has been having a lot of coughing and wheezing  at night as well as some hoarseness.  The cough is dry and hacking in  nature and it is clearly worse upon lying down.  She has even heard  audible wheezing and thinks she may have asthma.  The patient does have  significant postnasal drip as well as reflux symptoms.  She also  complains of some dysphagia with her medications.  She has had chronic  dyspnea on exertion but it has been much worse of late.  She has also  had difficulties with dizziness and bradycardia and is having medication  adjustments.  It is unclear whether she had a cardiac workup of any  kind.   PAST MEDICAL HISTORY:  1. Hypertension.  2. History of angina with coronary disease and percutaneous      intervention in 1997.  3. History of heart rhythm problems of unknown type.  4. History of stroke.  5. History of nasopharyngeal cancer with unknown type of therapy.  6. History of allergic rhinitis.  7. Status post 2 knee replacements.  8. History of hypothyroidism for which she is on replacement.   CURRENT MEDICATIONS:  1. Synthroid 0.05 mg daily.  2. Bystolic 5 mg daily.  3. Temazepam 30 mg q.h.s.  4. Hydrochlorothiazide 25 mg daily.  5. Tums p.r.n.   The patient has intolerance to:  1. PERCOCET.  2. RED DYES.  3. VARIOUS ANESTHETICS.  4. ASPIRIN.  5. LOVENOX.   SOCIAL HISTORY:  She is widowed and has children.  She has never smoked.   FAMILY HISTORY:  Remarkable for her mother having asthma.  She also had  a brother with  stomach cancer.   REVIEW OF SYSTEMS:  As per history of present illness.  Also see the  patient intake form documented in the chart.   PHYSICAL EXAMINATION:  GENERAL:  She is an overweight female in no acute  distress.  VITAL SIGNS:  Blood pressure 144/106, pulse 110, O2 saturation on room  air is 97%.  HEENT:  Pupils equal, round, reactive to light and accommodation.  Extraocular muscles are intact.  Nares are patent without discharge.  Oropharynx is clear.  NECK:  Supple without JVD or lymphadenopathy.  There is no palpable  thyromegaly.  CHEST:  Actually totally clear to auscultation with upper airway noise.  HEART:  Reveals a regular rate and rhythm.  ABDOMEN:  Soft, nontender with good bowel sounds.  GENITAL/RECTAL/BREASTS:  Not done, not indicated.  LOWER EXTREMITIES:  Show trace edema.  Pulses are intact distally.  NEUROLOGIC:  Alert and oriented with  no obvious motor deficits.   LABORATORY DATA:  Spirometry was done today in the office and it shows  no air flow obstruction by FEV1, FVC, or FEV1%.  The flow volume loop  did have subtle coving which may be associated with very minimal air  flow obstruction.   IMPRESSION:  Cough and congestion as well as upper airway wheezing that  I suspect is due to upper airway dysfunction.  The patient is having  post nasal drip and does have reflux symptoms.  I really think most of  her symptoms are due to upper airway sources rather than lower.  She has  no significant airflow obstruction by spirometry to suggest asthma.  At  this point in time, I would like to treat her aggressively for her upper  airway symptoms and see how things go.  The patient is agreeable to this  approach.   PLAN:  1. Benadryl 50 mg q.h.s.  2. Protonix 40 mg b.i.d.  3. The patient will follow up in 3 weeks or sooner if there are      problems.     Barbaraann Share, MD,FCCP  Electronically Signed    [facility]/MedQ  DD: 11/11/2006  DT: 11/11/2006  Job #:  [id number]   cc:   L. Lupe Carney, M.D.

## 2010-07-03 NOTE — Op Note (Signed)
NAMEJERMYA, [name]                ACCOUNT NO.:  [id number]   MEDICAL RECORD NO.:  [id number]          PATIENT TYPE:  AMB   LOCATION:  DSC                          FACILITY:  [facility]   PHYSICIAN:  Christopher E. Ezzard Standing, M.D.DATE OF BIRTH:  1915-02-26   DATE OF PROCEDURE:  09/10/2005  DATE OF DISCHARGE:                                 OPERATIVE REPORT   PREOPERATIVE DIAGNOSIS:  Naso pharyngeal bleeding.   POSTOPERATIVE DIAGNOSIS:  Naso pharyngeal bleeding.   PROCEDURE PERFORMED:  Nasal endoscopy with cauterization of naso pharyngeal  bleeding.   SURGEON:  Kristine Garbe. Ezzard Standing, M.D.   ANESTHESIA:  General endotracheal.   COMPLICATIONS:  None.   BRIEF CLINICAL NOTE:  The patient is an 77 year old female who has had a  history of naso pharyngeal carcinomas, status post treatment with radiation  therapy.  She has done well with no evidence of recurrent disease.  More  recently she has had episodes of recurrent bleeding from the back of her  nose; and, on exam in the office, appears to have some prominent vessels in  the naso pharynx that have been bleeding.  She has also had significant  elevation of her blood pressure with high systolic pressures which I think  are contributing to the recurrent bleeding.  She has been to the emergency  room a couple of times because of the bleeding.  The bleeding in the past  has stopped spontaneously.  On evaluation in the office I could identify  some clots in the back of the nose in the naso pharynx area, but no obvious  mass or abnormality noted.  Because of recurrent bleeding, she is taken to  the operating room for nasal endoscopy and cauterization of naso pharyngeal  vessels.   DESCRIPTION OF PROCEDURE:  After adequate endotracheal anesthesia, the nose  was prepped with Afrin-soaked pledgets for decongestant.  Then, using the 0  endoscope, nasal endoscopy was performed.  The nasal cavity was clear.  She  did have a large spur and slight  deviation of the septum to the right.  There was a little bit of bleeding from the spur area on passing the  endoscope and this area was cauterized.  In the naso pharynx, there was no  mass and no significant abnormality or mucosal abnormality noted.  No  evidence of recurrent carcinoma.  There were several prominent vessels,  especially on the right side of the naso pharynx, and these were cauterized  with the suction cautery.  This completed the procedure.  The patient was  awakened from anesthesia and transferred to the recovery room  postoperatively doing well.   DISPOSITION:  The patient was discharged home later this morning on Tylenol  and Darvocet p.r.n. pain.  Recommended follow-up with her medical physician  concerning adequate control of her hypertension.           ______________________________  Kristine Garbe Ezzard Standing, M.D.     CEN/MEDQ  D:  09/10/2005  T:  09/11/2005  Job:  [id number]   cc:   L. Lupe Carney, M.D.  Fax: [phone]

## 2010-07-03 NOTE — H&P (Signed)
[name], [name]                ACCOUNT NO.:  [id number]   MEDICAL RECORD NO.:  [id number]          PATIENT TYPE:  INP   LOCATION:  6523                         FACILITY:  [facility]   PHYSICIAN:  Melissa L. Ladona Ridgel, MD  DATE OF BIRTH:  [birth year]   DATE OF ADMISSION:  12/07/2005  DATE OF DISCHARGE:                                HISTORY & PHYSICAL   CHIEF COMPLAINT:  Syncope.   PRIMARY CARE PHYSICIAN:  Dr. Lupe Carney.   CARDIOLOGIST:  Dr. Peter Swaziland.   NEUROLOGIST:  Dr. Avie Echevaria.   HISTORY OF PRESENT ILLNESS:  The patient is an 77 year old African-American  female who is status post eye surgery at [facility] earlier in the day of  admission. According to the patient's daughter, her blood pressure was quite  elevated earlier in the day prior to surgery and therefore anesthesia was  required to give her medication during the procedure. The patient tolerated  the procedure well and was discharged to home. The patient returned to home  and only complained of mild neck tiredness. The patient states that her  granddaughter rubbed her neck and then her daughter proceeded to put in her  prednisone and Vigamox drops. When the patient leaned backwards in order for  her daughter to place the prednisone drops, she started to [facility], her head  dropped and she started to make funny sounds in her throat and mouth. She  slumped over and the family continued to try to stimulate her while calling  911. The patient's family moved her to the floor. She subsequently lost  continence of urine. Overall the patient's daughter states she felt cool to  touch but continued to breath shallowly and make some sounds. The patient  awoke several minutes later and had no recall for the event. The patient did  not recall any chest pain or shortness of breath. She did recall some  dizziness and spinning but she has had that sensation most of her life. She  had earlier in the evening described this feeling of  swooning while she  was walking, however, this resolved.   REVIEW OF SYSTEMS:  She denies fever, chills, nausea, vomiting. She had some  decreased appetite status post radiation a few years ago. She has dry mouth  and she has decreased hearing in her left ear. She often gets dizzy spells  and she had a mild cough recently with clear sputum.   PAST MEDICAL HISTORY:  She had a stroke in 1993. 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 She had eustachian cautery, DJD of her neck in 2007,  angioplasty in 1997, parathyroid surgery as well as the shingles in 2004.  She also had a heart murmur since she was in her teens.   PAST SURGICAL HISTORY:  In 1984 she had her left knee replaced, in '06 she  had her right knee replaced. She had a partial thyroidectomy and a partial  parathyroidectomy. She had a hammer toe repair.   SOCIAL HISTORY:  The patient had 16  pregnancies and 10 children. She does  not smoke, she does not drink. She acted as a Engineer, structural.   FAMILY HISTORY:  Mom deceased at the age of 70 with a stroke. Dad is  deceased from a stroke.   PHYSICAL EXAMINATION:  VITAL SIGNS:  Temperature 96.9, blood pressure  initially on arrival to the emergency room was 213/99, pulse was 68,  respirations 22, saturation 94%.  GENERAL:  She is a well-developed, well-nourished, African-American female  in no acute distress.  HEENT:  She is normocephalic, atraumatic with a right eye patch in place.  Her pupil in the left is equal, round and reactive, fundi appeared to be  intact on the left. Her right eye was not examined.  CHEST:  Clear to auscultation with mild end-expiratory wheeze which is  intermittent.  CARDIOVASCULAR:  Regular rate and rhythm, positive S1, S2. No S3, S4. No  murmurs, rubs or gallops could be appreciated.  ABDOMEN:  Soft, nontender, nondistended with positive bowel sounds.  EXTREMITIES:  Show no  cyanosis, clubbing or edema.  NEUROLOGIC:  She is awake, alert, oriented. Cranial nerves II-XII are  intact. Power was 5/5, DTRs were 2+, plantars were downgoing.   LABORATORY DATA:  White count was 6.2, hemoglobin 13.6, hematocrit 41.0,  platelets are 218. Her sodium was 136, potassium 3.4, chloride 97, CO2 30,  BUN was 20, creatinine was 1.1. Her glucose was 95, her BNP was 301. Point  of care enzymes were negative x1. Ethanol was negative, her LFTs were within  normal limits and her urinalysis was negative.   She has allergies to [facility] and DARVOCET.   MEDICATIONS:  Her medications were reviewed and are composed of:   1. Vigamox 0.5% solution 1 drop to the right eye 4 times daily.  2. Econopred 1 drop of 1% solution to the right eye 4 times daily.  3. Atenolol 50 mg p.o. daily.  4. Multivitamin.  5. Advair 250/50 inhaled b.i.d.  6. Clonidine 0.1 mg p.o. b.i.d.   ASSESSMENT/PLAN:  This is an 77 year old African-American female status post  lens replacement at [facility] in the right eye. She had hypertension surrounding  her procedure and went home and had a syncopal episode which sounded more  like seizure activity with associated loss of bladder continence.   1. Cardiovascular, hypertensive urgency:  Ultimately over the course of      the emergency room visit, the patient's blood pressure came down into      the 150s systolic. She was not given any urgent treatment for her blood      pressure secondary to its improvement. The question becomes whether      this precipitated the seizure at home. Will continue to attain slow      blood pressure control using p.r.n. labetalol and nitro if needed. She      will placed under seizure precautions. Will continue her Clonidine and      atenolol. Will put her in for serial cardiac markers and a carotid      ultrasound as well as an EEG.  2. Neurology:  Possible seizure activity. I would recommend that my     colleagues consult neurology in  the a.m. She has seen Dr. Sandria Manly in the      past. Will ask for an EEG. Will control her blood pressure and do an      ultrasound and 2-D echo as stated.  3. GI:  She has no current complaints or issues.  4.  GU:  Will check a urinalysis, culture and sensitivity which was done in      the emergency room and was negative.  5. Eye surgery:  I did speak with the [facility] on-call fellow who states that      there is no contraindication to CT or MRI. We will continue her drops      and speak with [facility] in the morning if necessary.  6. Anticoagulation:  At this time will use SCDs if she is in need of      anticoagulation because of a stroke and we need to speak with the      physicians at [facility] with regard to how this will impact on her current      eye surgery.  7. Endocrine:  She uses an unknown dose of Synthroid which will need to be      checked with her daughter in the a.m. and restarted.  8. DVT prophylaxis:  For now will use SCDs.   ADDENDUM:  Please note that I did speak with the on-call physician as stated  and she can have an MRI from an ophthalmology perspective. She did however  have reconstruction of her jaw after radiation for her nasopharyngeal  carcinoma and therefore it may be contraindicated because of the post that  she has, this will need to be investigated.      Melissa L. Ladona Ridgel, MD  Electronically Signed     MLT/MEDQ  D:  12/08/2005  T:  12/08/2005  Job:  [id number]   cc:   L. Lupe Carney, M.D.  Peter M. Swaziland, M.D.  Genene Churn. Love, M.D.

## 2010-07-03 NOTE — Procedures (Signed)
EEG NUMBER:  [id number].   REQUESTING PHYSICIAN:  Dr. Donnalee Curry.   CURRENT HISTORY:  This is a routine EEG done without photic stimulation or  hyperventilation.  The patient is described as awake.  This is an 77-year-  old woman history of syncopal episode after cataract surgery.   DESCRIPTION:  EEG is performed for evaluation of possible seizure.   DESCRIPTION:  The dominant rhythm in this tracing is a moderate amplitude  alpha rhythm of 10 Hz which predominates posteriorly, appears without  abnormal asymmetry and attenuates with eye opening and closing.  Low  amplitude fast activity is seen frontally and centrally and appears without  abnormal asymmetry.  No focal slowing is noted.  No epileptiform discharges  are seen.  The patient remained in the awake state throughout the recording.  Photic stimulation and hyperventilation were not performed.  Single channel  devoted EKG revealed sinus rhythm throughout with a rate of approximately 72  beats per minute.   CONCLUSION:  Normal study in the awake state.      Michael L. Thad Ranger, M.D.  Electronically Signed     ZOX:WRUE  D:  12/07/2005 22:19:40  T:  12/08/2005 22:22:34  Job #:  [id number]

## 2010-07-29 ENCOUNTER — Encounter: Payer: Self-pay | Admitting: *Deleted

## 2010-07-30 ENCOUNTER — Encounter: Payer: Self-pay | Admitting: *Deleted

## 2010-08-04 ENCOUNTER — Ambulatory Visit (INDEPENDENT_AMBULATORY_CARE_PROVIDER_SITE_OTHER): Payer: Medicare Other | Admitting: Cardiovascular Disease

## 2010-08-04 ENCOUNTER — Encounter: Payer: Self-pay | Admitting: Cardiovascular Disease

## 2010-08-04 VITALS — BP 172/84 | HR 68 | Ht 62.0 in | Wt 137.6 lb

## 2010-08-04 DIAGNOSIS — I10 Essential (primary) hypertension: Secondary | ICD-10-CM

## 2010-08-04 NOTE — Progress Notes (Signed)
[name] Date of Birth  Sep 14, 1915 [facility] [address]     [address] [place], [place]  [address] [phone]  Fax  [phone]  History of Present Illness:  Pt is doing well.  Has not been taking Hydralizine 3 times a day prescribed - only twice a day.  Her blood pressures been chronically elevated. She has multiple intolerances to multiple medications and it has been difficult to keep her on any standard medical regimen.  Current Outpatient Prescriptions on File Prior to Visit  Medication Sig Dispense Refill  . acetaminophen (TYLENOL) 500 MG tablet Take 500 mg by mouth as needed.        . ALPRAZolam (XANAX) 0.5 MG tablet Take 0.25 mg by mouth at bedtime.       Marland Kitchen atenolol (TENORMIN) 50 MG tablet Take 50 mg by mouth daily.        . calcium carbonate (TUMS - DOSED IN MG ELEMENTAL CALCIUM) 500 MG chewable tablet Chew 1 tablet by mouth 3 (three) times daily.        . furosemide (LASIX) 40 MG tablet Take 20 mg by mouth daily.       . hydrALAZINE (APRESOLINE) 25 MG tablet Take 25 mg by mouth 3 (three) times daily.        . isosorbide dinitrate (ISORDIL) 10 MG tablet Take 10 mg by mouth 2 (two) times daily.       Marland Kitchen levothyroxine (SYNTHROID, LEVOTHROID) 75 MCG tablet Take 75 mcg by mouth daily.        Marland Kitchen omeprazole (PRILOSEC) 20 MG capsule Take 20 mg by mouth daily.        Marland Kitchen oxymetazoline (AFRIN) 0.05 % nasal spray Place 2 sprays into the nose as needed.       . temazepam (RESTORIL) 30 MG capsule Take 30 mg by mouth at bedtime.        Marland Kitchen DISCONTD: aspirin 81 MG tablet Take 81 mg by mouth daily.        Marland Kitchen DISCONTD: mupirocin (BACTROBAN) 2 % cream Apply 1 application topically 3 (three) times daily.        Marland Kitchen DISCONTD: STARCH-MALTO DEXTRIN (THICK-IT) POWD Take by mouth 3 (three) times daily.          Allergies  Allergen Reactions  . Enoxaparin Sodium   . Oxycodone-Acetaminophen     Past Medical History  Diagnosis Date  . Hyperlipidemia     . Hypertension   . Chronic kidney disease   . Hypothyroidism   . Cardiomegaly   . CHF (congestive heart failure)     Recent hospitalization for CHF & pneumonia -- EF is 40-45%  ( 03/16/10 by Dr. Peter Swaziland )    . Shortness of breath   . Dyspnea   . Stroke     history of   . History of rheumatic fever   . Head and neck cancer     status post radiation therapy  . Coronary artery disease     EF of 40-45% with history of angioplasty  . Pulmonary hypertension     with PA pressure estimated at 53 mmHg  . Pulmonary edema   . Hypoxia   Aortic insufficiency - moderate - severe   Past Surgical History  Procedure Date  . Tonsillectomy   . Thyroidectomy, partial   . Hammer toe surgery   . Refractive surgery     both eyes  . Coronary angioplasty  with angioplasty    History  Smoking status  . Never Smoker   Smokeless tobacco  . Never Used    History  Alcohol Use No    Family History  Problem Relation Age of Onset  . Stroke Father   . Ulcers Father   . Asthma Mother   . Cancer Brother   . Cancer Brother   . Cancer Brother   . Dementia Sister   . Congenital heart disease Sister   . Hypertension Son   . Hypertension Son   . Hypertension Son   . Hypertension Son   . Hypertension Son   . Hypertension Son   . Hypertension Son   . Hypertension Daughter   . Hypertension Daughter   . Hypertension Daughter     Reviw of Systems:  Reviewed in the HPI.  All other systems are negative.  Physical Exam: BP 172/84  Pulse 68  Ht 5\' 2"  (1.575 m)  Wt 137 lb 9.6 oz (62.415 kg)  BMI 25.17 kg/m2 The patient is alert and oriented x 3.  The mood and affect are normal.   Skin: warm and dry.  Color is normal.    HEENT:   the sclera are nonicteric.  The mucous membranes are moist.  The carotids are 2+ without bruits.  There is no thyromegaly.  There is no JVD.    Lungs: clear.  The chest wall is non tender.    Heart: regular rate with a normal S1 and S2.  There is a 3/6  systolic murmur.  There is soft diastolic The PMI is not displaced.     Abdomin: good bowel sounds.  There is no guarding or rebound.  There is no hepatosplenomegaly or tenderness.  There are no masses.   Extremities:  no clubbing, cyanosis, or edema.  The legs are without rashes.  The distal pulses are 1+  Neuro:  Cranial nerves II - XII are intact.  Motor and sensory functions are intact.    The gait is normal.  Assessment / Plan:

## 2010-08-04 NOTE — Assessment & Plan Note (Signed)
We will increase her hydralazine and isosorbide to the  prescribed dose that was given to her in the hospital. I've encouraged her to watch her salt. At age 77 I think she's overall doing fairly well.

## 2010-09-30 ENCOUNTER — Emergency Department (HOSPITAL_COMMUNITY)
Admission: EM | Admit: 2010-09-30 | Discharge: 2010-10-01 | Disposition: A | Discharge status: Home / Self Care | Payer: Medicare Other | Attending: Emergency Medicine | Admitting: Emergency Medicine

## 2010-09-30 DIAGNOSIS — Z79899 Other long term (current) drug therapy: Secondary | ICD-10-CM | POA: Insufficient documentation

## 2010-09-30 DIAGNOSIS — I10 Essential (primary) hypertension: Secondary | ICD-10-CM | POA: Insufficient documentation

## 2010-09-30 DIAGNOSIS — W1811XA Fall from or off toilet without subsequent striking against object, initial encounter: Secondary | ICD-10-CM | POA: Insufficient documentation

## 2010-09-30 DIAGNOSIS — Y92009 Unspecified place in unspecified non-institutional (private) residence as the place of occurrence of the external cause: Secondary | ICD-10-CM | POA: Insufficient documentation

## 2010-09-30 DIAGNOSIS — M25559 Pain in unspecified hip: Secondary | ICD-10-CM | POA: Insufficient documentation

## 2010-09-30 DIAGNOSIS — IMO0002 Reserved for concepts with insufficient information to code with codable children: Secondary | ICD-10-CM | POA: Insufficient documentation

## 2010-09-30 DIAGNOSIS — S0003XA Contusion of scalp, initial encounter: Secondary | ICD-10-CM | POA: Insufficient documentation

## 2010-09-30 DIAGNOSIS — G8929 Other chronic pain: Secondary | ICD-10-CM | POA: Insufficient documentation

## 2010-09-30 DIAGNOSIS — Z8673 Personal history of transient ischemic attack (TIA), and cerebral infarction without residual deficits: Secondary | ICD-10-CM | POA: Insufficient documentation

## 2010-09-30 DIAGNOSIS — I2510 Atherosclerotic heart disease of native coronary artery without angina pectoris: Secondary | ICD-10-CM | POA: Insufficient documentation

## 2010-09-30 DIAGNOSIS — M542 Cervicalgia: Secondary | ICD-10-CM | POA: Insufficient documentation

## 2010-09-30 DIAGNOSIS — Z85819 Personal history of malignant neoplasm of unspecified site of lip, oral cavity, and pharynx: Secondary | ICD-10-CM | POA: Insufficient documentation

## 2010-09-30 DIAGNOSIS — S1093XA Contusion of unspecified part of neck, initial encounter: Secondary | ICD-10-CM | POA: Insufficient documentation

## 2010-09-30 DIAGNOSIS — M1990 Unspecified osteoarthritis, unspecified site: Secondary | ICD-10-CM | POA: Insufficient documentation

## 2010-10-01 ENCOUNTER — Emergency Department (HOSPITAL_COMMUNITY): Payer: Medicare Other

## 2010-11-28 ENCOUNTER — Emergency Department (HOSPITAL_COMMUNITY)
Admission: EM | Admit: 2010-11-28 | Discharge: 2010-11-28 | Disposition: A | Discharge status: Home / Self Care | Payer: Medicare Other | Attending: Emergency Medicine | Admitting: Emergency Medicine

## 2010-11-28 DIAGNOSIS — R040 Epistaxis: Secondary | ICD-10-CM | POA: Insufficient documentation

## 2010-11-28 DIAGNOSIS — R05 Cough: Secondary | ICD-10-CM | POA: Insufficient documentation

## 2010-11-28 DIAGNOSIS — M1990 Unspecified osteoarthritis, unspecified site: Secondary | ICD-10-CM | POA: Insufficient documentation

## 2010-11-28 DIAGNOSIS — Z8673 Personal history of transient ischemic attack (TIA), and cerebral infarction without residual deficits: Secondary | ICD-10-CM | POA: Insufficient documentation

## 2010-11-28 DIAGNOSIS — I2510 Atherosclerotic heart disease of native coronary artery without angina pectoris: Secondary | ICD-10-CM | POA: Insufficient documentation

## 2010-11-28 DIAGNOSIS — Z9889 Other specified postprocedural states: Secondary | ICD-10-CM | POA: Insufficient documentation

## 2010-11-28 DIAGNOSIS — Z85819 Personal history of malignant neoplasm of unspecified site of lip, oral cavity, and pharynx: Secondary | ICD-10-CM | POA: Insufficient documentation

## 2010-11-28 DIAGNOSIS — Z79899 Other long term (current) drug therapy: Secondary | ICD-10-CM | POA: Insufficient documentation

## 2010-11-28 DIAGNOSIS — R234 Changes in skin texture: Secondary | ICD-10-CM | POA: Insufficient documentation

## 2010-11-28 DIAGNOSIS — R51 Headache: Secondary | ICD-10-CM | POA: Insufficient documentation

## 2010-11-28 DIAGNOSIS — R059 Cough, unspecified: Secondary | ICD-10-CM | POA: Insufficient documentation

## 2010-11-28 DIAGNOSIS — I10 Essential (primary) hypertension: Secondary | ICD-10-CM | POA: Insufficient documentation

## 2010-11-28 DIAGNOSIS — D649 Anemia, unspecified: Secondary | ICD-10-CM | POA: Insufficient documentation

## 2010-11-28 LAB — CBC
MCH: 29.9 pg (ref 26.0–34.0)
MCHC: 32.2 g/dL (ref 30.0–36.0)
Platelets: 169 10*3/uL (ref 150–400)
RDW: 14.9 % (ref 11.5–15.5)

## 2010-11-28 LAB — DIFFERENTIAL
Basophils Absolute: 0 10*3/uL (ref 0.0–0.1)
Basophils Relative: 0 % (ref 0–1)
Eosinophils Absolute: 0.1 10*3/uL (ref 0.0–0.7)
Monocytes Absolute: 0.4 10*3/uL (ref 0.1–1.0)
Monocytes Relative: 7 % (ref 3–12)

## 2010-11-28 LAB — BASIC METABOLIC PANEL
BUN: 34 mg/dL — ABNORMAL HIGH (ref 6–23)
Calcium: 9.7 mg/dL (ref 8.4–10.5)
Creatinine, Ser: 1.38 mg/dL — ABNORMAL HIGH (ref 0.50–1.10)
GFR calc Af Amer: 37 mL/min — ABNORMAL LOW (ref >90)
GFR calc non Af Amer: 32 mL/min — ABNORMAL LOW (ref >90)

## 2010-11-28 LAB — PROTIME-INR: Prothrombin Time: 13.2 seconds (ref 11.6–15.2)

## 2010-11-30 LAB — I-STAT 8, (EC8 V) (CONVERTED LAB)
BUN: 27 — ABNORMAL HIGH
Bicarbonate: 28.9 — ABNORMAL HIGH
Glucose, Bld: 116 — ABNORMAL HIGH
Hemoglobin: 14.6
Potassium: 4.1
Sodium: 137

## 2010-11-30 LAB — BASIC METABOLIC PANEL
GFR calc non Af Amer: 44 — ABNORMAL LOW
Potassium: 3.8
Sodium: 138

## 2010-11-30 LAB — DIFFERENTIAL
Basophils Absolute: 0
Eosinophils Relative: 5
Lymphocytes Relative: 21
Neutro Abs: 2.8
Neutrophils Relative %: 65

## 2010-11-30 LAB — POCT CARDIAC MARKERS
Operator id: 277751
Troponin i, poc: <0.05

## 2010-11-30 LAB — CBC
HCT: 39.6
Platelets: 197
RDW: 14.1 — ABNORMAL HIGH

## 2010-12-09 NOTE — Consult Note (Signed)
  NAMECHARIE, PINKUS                ACCOUNT NO.:  [id number]  MEDICAL RECORD NO.:  [id number]  LOCATION:  [facility]                         FACILITY:  [facility]  PHYSICIAN:  Kristine Garbe. Ezzard Standing, M.D.DATE OF BIRTH:  Nov 26, 1915  DATE OF CONSULTATION:  11/28/2010 DATE OF DISCHARGE:                                CONSULTATION   REASON FOR CONSULTATION:  The patient with hemoptysis.  BRIEF HISTORY:  [name] is a 77 year old female with previous history of nasopharyngeal carcinoma status post radiation therapy 7 years ago.  She has had recent bouts of hemoptysis.  At one point, she was admitted to [facility] by little over a month ago with hemoptysis and hemoglobin dropping down to between 6 and 8 according to daughter, and she received 2 units of packed red blood cells.  They really never identified the site or origin of hemoptysis.  When she had some bleeding, she generally coughs blood up from her throat, but sometimes blow some out of her nose.  Question whether the etiology of the hemoptysis is the posterior nasal passage way versus hypopharynx or upper esophagus.  She developed some hemoptysis earlier this morning and presented to the emergency room.  While in the emergency room, the bleeding stopped.  Hemoglobin was checked and was 10.5.  Presented to the ER to evaluate for etiology of hemoptysis as she has had this previously seen in my office and never really could not determine the site or origin of the hemoptysis because it generally stops on its own. On exam in the ER, nasal exam revealed a little bit dry blood in the anterior nasal packs on the left side, but no active bleeding noted. Nasal endoscopy was performed bilaterally and nasal endoscopy revealed posterior nasal passageway was clear.  There was no blood clot and no active bleeding noted.  Nasopharynx on either side was clear with no active bleeding throughout the evaluation.  On fiberoptic laryngoscopy, she had a little bit  of bright red mucousy blood around the arytenoids and piriform area.  After swallowing some water, this cleared and no active bleeding noted.  Epiglottis and AE folds, false and true cords were clear.  Piriform sinuses were clear after cleaning the right red mucousy blood with swallowing.  Again, cannot identify any site or origin of this bleeding.  Oral exam was normal.  Normal-appearing tonsil area, palate, and tongue.  IMPRESSION:  Hemoptysis, questionable site of origin, question whether this is related to upper cervical esophagus or upper esophageal sphincter area versus posterior nasal cavity, again cannot identify a definite site or origin of this bleeding.  She has had no further bleeding on evaluation in the emergency room for 30 minutes.  Reviewed with the daughter concerning what to observe time to time whether this bleeding coming from the posterior nasal cavity versus hypopharynx area. We will follow up in my office on a p.r.n. basis if any further hemoptysis.          ______________________________ Kristine Garbe Ezzard Standing, M.D.     CEN/MEDQ  D:  11/28/2010  T:  11/28/2010  Job:  [id number]  Electronically Signed by Dillard Cannon M.D. on 12/09/2010 02:03:22 PM

## 2010-12-29 ENCOUNTER — Encounter (HOSPITAL_COMMUNITY)
Admission: RE | Disposition: A | Discharge status: Other Acute Inpatient Hospital | Payer: Self-pay | Source: Ambulatory Visit | Attending: Gastroenterology

## 2010-12-29 ENCOUNTER — Emergency Department (HOSPITAL_COMMUNITY)
Admission: EM | Admit: 2010-12-29 | Discharge: 2010-12-29 | Disposition: A | Discharge status: Home / Self Care | Payer: Medicare Other | Source: Home / Self Care | Attending: Emergency Medicine | Admitting: Emergency Medicine

## 2010-12-29 ENCOUNTER — Ambulatory Visit (HOSPITAL_COMMUNITY)
Admission: RE | Admit: 2010-12-29 | Discharge: 2010-12-29 | Payer: Medicare Other | Source: Ambulatory Visit | Attending: Gastroenterology | Admitting: Gastroenterology

## 2010-12-29 ENCOUNTER — Encounter (HOSPITAL_COMMUNITY): Payer: Self-pay | Admitting: *Deleted

## 2010-12-29 ENCOUNTER — Encounter (HOSPITAL_COMMUNITY): Payer: Self-pay | Admitting: Emergency Medicine

## 2010-12-29 DIAGNOSIS — Z5309 Procedure and treatment not carried out because of other contraindication: Secondary | ICD-10-CM | POA: Insufficient documentation

## 2010-12-29 DIAGNOSIS — K922 Gastrointestinal hemorrhage, unspecified: Secondary | ICD-10-CM | POA: Insufficient documentation

## 2010-12-29 DIAGNOSIS — E039 Hypothyroidism, unspecified: Secondary | ICD-10-CM | POA: Insufficient documentation

## 2010-12-29 DIAGNOSIS — I10 Essential (primary) hypertension: Secondary | ICD-10-CM | POA: Insufficient documentation

## 2010-12-29 DIAGNOSIS — E785 Hyperlipidemia, unspecified: Secondary | ICD-10-CM | POA: Insufficient documentation

## 2010-12-29 DIAGNOSIS — R42 Dizziness and giddiness: Secondary | ICD-10-CM | POA: Insufficient documentation

## 2010-12-29 DIAGNOSIS — I509 Heart failure, unspecified: Secondary | ICD-10-CM | POA: Insufficient documentation

## 2010-12-29 HISTORY — DX: Pneumonia, unspecified organism: J18.9

## 2010-12-29 HISTORY — DX: Anemia, unspecified: D64.9

## 2010-12-29 HISTORY — DX: Unspecified osteoarthritis, unspecified site: M19.90

## 2010-12-29 SURGERY — Surgical Case

## 2010-12-29 NOTE — ED Provider Notes (Signed)
Medical screening examination/treatment/procedure(s) were performed by non-physician practitioner and as supervising physician I was immediately available for consultation/collaboration.   Nelia Shi, MD 12/29/10 [phone]

## 2010-12-29 NOTE — ED Provider Notes (Signed)
History     CSN: [id number] Arrival date & time: 12/29/2010  2:22 PM   First MD Initiated Contact with Patient 12/29/10 1430      Chief Complaint  Patient presents with  . Hypertension    (Consider location/radiation/quality/duration/timing/severity/associated sxs/prior treatment) HPI  She is sent to emergency room from endoscopy lab where she was scheduled for an outpatient upper endoscopy but in preop was found to have a blood pressure of 236/125 and was sent to emergency department for further evaluation. Patient has a daughter at bedside and states that she has felt per her normal except for the fact that she was sneezing last night and therefore took a Benadryl but denies Sudafed or decongestant use stating that she remained n.p.o. as instructed but took all of her blood pressure medicine as told that she was allowed to this morning except for her Lasix which she skipped. Patient states "I did not want to have to pee during the procedure and therefore I do not take my Lasix." Again patient denies fevers, chills, headache, dizziness, chest pain, shortness of breath, abdominal pain, nausea, vomiting, diarrhea, visual changes. Patient states I feel fine.  Dr. Kathy Breach called down to ER and states that he would like to have the patient's blood pressure stabilized for a few days before reconsideration of endoscopy and for patient to followup with her primary care doctor for recheck of blood pressure in the next few days and then endoscopy can be rescheduled per PCP.  Past Medical History  Diagnosis Date  . Hyperlipidemia   . Hypertension   . Chronic kidney disease   . Hypothyroidism   . Cardiomegaly   . CHF (congestive heart failure)     Recent hospitalization for CHF & pneumonia -- EF is 40-45%  ( 03/16/10 by Dr. Peter Swaziland )    . Shortness of breath   . Dyspnea   . Stroke     history of   . History of rheumatic fever   . Head and neck cancer     status post radiation therapy  .  Coronary artery disease     EF of 40-45% with history of angioplasty  . Pulmonary hypertension     with PA pressure estimated at 53 mmHg  . Pulmonary edema   . Hypoxia   . Aortic insufficiency     moderate-severe  . Arthritis   . Pneumonia     hx of  . Anemia     Past Surgical History  Procedure Date  . Tonsillectomy   . Thyroidectomy, partial   . Hammer toe surgery   . Refractive surgery     both eyes  . Coronary angioplasty     with angioplasty  . Joint replacement     bilat    Family History  Problem Relation Age of Onset  . Stroke Father   . Ulcers Father   . Asthma Mother   . Cancer Brother   . Cancer Brother   . Cancer Brother   . Dementia Sister   . Congenital heart disease Sister   . Hypertension Son   . Hypertension Son   . Hypertension Son   . Hypertension Son   . Hypertension Son   . Hypertension Son   . Hypertension Son   . Hypertension Daughter   . Hypertension Daughter   . Hypertension Daughter     History  Substance Use Topics  . Smoking status: Never Smoker   . Smokeless tobacco: Never Used  .  Alcohol Use: No    OB History    Grav Para Term Preterm Abortions TAB SAB Ect Mult Living                  Review of Systems  All other systems reviewed and are negative.    Allergies  Enoxaparin sodium; Hydrocodone-acetaminophen; and Oxycodone-acetaminophen  Home Medications   Current Outpatient Rx  Name Route Sig Dispense Refill  . ACETAMINOPHEN 500 MG PO TABS Oral Take 500 mg by mouth as needed.      . ALPRAZOLAM 0.5 MG PO TABS Oral Take 0.25 mg by mouth at bedtime.     Marland Kitchen VITAMIN C PO Oral Take by mouth.      . ATENOLOL 50 MG PO TABS Oral Take 50 mg by mouth daily.      Marland Kitchen CALCIUM CARBONATE ANTACID 500 MG PO CHEW Oral Chew 1 tablet by mouth 3 (three) times daily.      . FUROSEMIDE 40 MG PO TABS Oral Take 20 mg by mouth daily.     Marland Kitchen HYDRALAZINE HCL 25 MG PO TABS Oral Take 25 mg by mouth 3 (three) times daily.      . IRON PO Oral  Take by mouth daily.      . ISOSORBIDE DINITRATE 10 MG PO TABS Oral Take 10 mg by mouth 2 (two) times daily.     Marland Kitchen LEVOTHYROXINE SODIUM 75 MCG PO TABS Oral Take 75 mcg by mouth daily.      Marland Kitchen OMEPRAZOLE 20 MG PO CPDR Oral Take 20 mg by mouth daily.      Marland Kitchen OXYMETAZOLINE HCL 0.05 % NA SOLN Nasal Place 2 sprays into the nose as needed.     Marland Kitchen TEMAZEPAM 30 MG PO CAPS Oral Take 30 mg by mouth at bedtime.        BP 171/90  Pulse 101  Temp(Src) 98.2 F (36.8 C) (Oral)  Resp 17  SpO2 98%  Physical Exam  Vitals reviewed. Constitutional: She is oriented to person, place, and time. She appears well-developed and well-nourished. No distress.  HENT:  Head: Normocephalic and atraumatic.  Right Ear: External ear normal.  Left Ear: External ear normal.  Nose: Nose normal.  Mouth/Throat: No oropharyngeal exudate.       Mild erythema of posterior pharynx and tonsils no tonsillar exudate or enlargement. Patent airway. Swallowing secretions well  Eyes: Conjunctivae and EOM are normal. Pupils are equal, round, and reactive to light.  Neck: Normal range of motion. Neck supple. No JVD present.  Cardiovascular: Normal rate, regular rhythm, normal heart sounds and intact distal pulses.  Exam reveals no gallop and no friction rub.   No murmur heard. Pulmonary/Chest: Effort normal and breath sounds normal. No respiratory distress. She has no wheezes. She has no rales. She exhibits no tenderness.  Abdominal: Soft. She exhibits no distension and no mass. There is no tenderness. There is no rebound and no guarding.  Musculoskeletal: She exhibits no edema and no tenderness.  Lymphadenopathy:    She has no cervical adenopathy.  Neurological: She is alert and oriented to person, place, and time. She has normal reflexes.  Skin: Skin is warm and dry. No rash noted. She is not diaphoretic.  Psychiatric: She has a normal mood and affect.    ED Course  Procedures (including critical care time)  Patient is lying in  bed comfortably without complaint.  Labs Reviewed - No data to display No results found.   1. Hypertension  MDM  Is afebrile, alert and oriented, no acute distress, lying in bed without complaint, with blood pressure on multiple reads in ER approximately 172/93. Patient and daughter agreeable to following up with primary care doctor for recheck of blood pressure and for reconsideration and endoscopy. Question whether or not Benadryl and skipping Lasix and give patient a temporary hypertension with current blood pressure warranting no further work for a patient with no complaint.        Jenness Corner, [place] 12/29/10 [phone]

## 2010-12-29 NOTE — ED Notes (Signed)
Pt here from endo for hypertension; pt to have procedure done and found to be hypertensive; pt denies complaint

## 2010-12-29 NOTE — Progress Notes (Signed)
Pt in hypertensive crisis. Pt transferred to ED per Dr Ewing Schlein.

## 2010-12-30 NOTE — H&P (Signed)
See scanned note--pt presented to endo and was found to have increased bp weak and dizzy and was sent to er for further eval

## 2011-08-24 ENCOUNTER — Encounter: Payer: Self-pay | Admitting: Cardiovascular Disease

## 2011-08-24 ENCOUNTER — Ambulatory Visit (INDEPENDENT_AMBULATORY_CARE_PROVIDER_SITE_OTHER): Payer: Medicare Other | Admitting: Cardiovascular Disease

## 2011-08-24 VITALS — BP 146/88 | HR 86 | Ht 61.0 in | Wt 138.8 lb

## 2011-08-24 DIAGNOSIS — I509 Heart failure, unspecified: Secondary | ICD-10-CM

## 2011-08-24 DIAGNOSIS — R0602 Shortness of breath: Secondary | ICD-10-CM

## 2011-08-24 DIAGNOSIS — R7989 Other specified abnormal findings of blood chemistry: Secondary | ICD-10-CM

## 2011-08-24 DIAGNOSIS — R799 Abnormal finding of blood chemistry, unspecified: Secondary | ICD-10-CM

## 2011-08-24 LAB — BASIC METABOLIC PANEL
CO2: 32 mEq/L (ref 19–32)
Chloride: 101 mEq/L (ref 96–112)
Potassium: 4.7 mEq/L (ref 3.5–5.1)
Sodium: 141 mEq/L (ref 135–145)

## 2011-08-24 MED ORDER — FUROSEMIDE 40 MG PO TABS: 40.0000 mg | ORAL_TABLET | Freq: Every day | ORAL | Status: DC

## 2011-08-24 MED ORDER — POTASSIUM CHLORIDE CRYS ER 10 MEQ PO TBCR: 10.0000 meq | EXTENDED_RELEASE_TABLET | Freq: Every day | ORAL | Status: DC

## 2011-08-24 NOTE — Assessment & Plan Note (Addendum)
Mrs. Dhaliwal presents with some increased shortness of breath. She also recently had a chest x-ray that reveals mild congestive heart failure. She has been eating lots of salt. She likes the cream of chicken soup.  We will increase her Lasix to 40 mg a day. We'll add potassium chloride 10 mEq a day. I've asked her to cut back on her salt intake.  We'll get labs today including a basic metabolic profile and a BNP because of her shortness of breath.  She has a history of mild congestive heart failure and also has moderate to severe aortic insufficiency and some degree of aortic stenosis. We will repeat her echocardiogram.  She has home oxygen that she received in the  hospital. She also has the diagnosis of pulmonary hypertension. We will be able to  further evaluate her pulmonary hypertension with the echocardiogram. I would like her to continue her home oxygen for the time being. We will probably need to have the home health agency check an O2 saturation during the visit.

## 2011-08-24 NOTE — Progress Notes (Signed)
[name] Date of Birth  [birth year]       [facility]    [place] [address]  [address] [place], [place]  [address]   [place], [place]  [address] [phone]     [phone]   Fax  [phone]    Fax [phone]  Problem List: 1. Hypertension 2. CHF 3. Hypothyroidism 4. Aortic insufficiency 5. Pulmonary Hypertension  History of Present Illness:  [name] is a 78 year old female with a history of congestive heart failure, aortic stenosis, hypothyroidism, and hypertension. She presents today after being diagnosed with mild congestive heart failure after she went to her general medical doctors office.  She has been eating lots of cream of  chicken soup. She has problems with her esophagus and has bleeding of her esophagus that she eats food that is too rough. She finds that the chicken soup goes on fairly well. She's also had some mild shortness of breath recently. She also describes some back pain and some left arm discomfort.  Her daughter brought her blood pressure log.  A lot of her readings are fairly elevated although some are in normal range.  Current Outpatient Prescriptions on File Prior to Visit  Medication Sig Dispense Refill  . acetaminophen (TYLENOL) 500 MG tablet Take 500 mg by mouth as needed. Pain/fever      . calcium carbonate (TUMS - DOSED IN MG ELEMENTAL CALCIUM) 500 MG chewable tablet Chew 1 tablet by mouth 3 (three) times daily.        . carvedilol (COREG) 6.25 MG tablet Take 12.5 mg by mouth 2 (two) times daily with a meal.       . furosemide (LASIX) 40 MG tablet Take 20 mg by mouth daily.       . hydrALAZINE (APRESOLINE) 25 MG tablet Take 25 mg by mouth 3 (three) times daily.        . isosorbide dinitrate (ISORDIL) 10 MG tablet Take 10 mg by mouth 2 (two) times daily.       Marland Kitchen levothyroxine (SYNTHROID, LEVOTHROID) 75 MCG tablet Take 75 mcg by mouth daily.        . Melatonin 3 MG TABS Take 2.5 mg by  mouth at bedtime.       . pantoprazole (PROTONIX) 40 MG tablet Take 40 mg by mouth daily.      . temazepam (RESTORIL) 30 MG capsule Take 30 mg by mouth at bedtime.        . IRON PO Take by mouth daily.       Marland Kitchen omeprazole (PRILOSEC) 20 MG capsule Take 20 mg by mouth daily.        Marland Kitchen oxymetazoline (AFRIN) 0.05 % nasal spray Place 2 sprays into the nose at bedtime.         Allergies  Allergen Reactions  . Enoxaparin Sodium   . Hydrocodone-Acetaminophen     syncope  . Oxycodone-Acetaminophen     Past Medical History  Diagnosis Date  . Hyperlipidemia   . Hypertension   . Chronic kidney disease   . Hypothyroidism   . Cardiomegaly   . CHF (congestive heart failure)     Recent hospitalization for CHF & pneumonia -- EF is 40-45%  ( 03/16/10 by Dr. Peter [place] )    . Shortness of breath   . Dyspnea   . Stroke     history of   . History of rheumatic fever   . Head and  neck cancer     status post radiation therapy  . Coronary artery disease     EF of 40-45% with history of angioplasty  . Pulmonary hypertension     with PA pressure estimated at 53 mmHg  . Pulmonary edema   . Hypoxia   . Aortic insufficiency     moderate-severe  . Arthritis   . Pneumonia     hx of  . Anemia     Past Surgical History  Procedure Date  . Tonsillectomy   . Thyroidectomy, partial   . Hammer toe surgery   . Refractive surgery     both eyes  . Coronary angioplasty     with angioplasty  . Joint replacement     bilat    History  Smoking status  . Never Smoker   Smokeless tobacco  . Never Used    History  Alcohol Use No    Family History  Problem Relation Age of Onset  . Stroke Father   . Ulcers Father   . Asthma Mother   . Cancer Brother   . Cancer Brother   . Cancer Brother   . Dementia Sister   . Congenital heart disease Sister   . Hypertension Son   . Hypertension Son   . Hypertension Son   . Hypertension Son   . Hypertension Son   . Hypertension Son   . Hypertension  Son   . Hypertension Daughter   . Hypertension Daughter   . Hypertension Daughter     Reviw of Systems:  Reviewed in the HPI.  All other systems are negative.  Physical Exam: Blood pressure 146/88, pulse 86, height 5\' 1"  (1.549 m), weight 138 lb 12.8 oz (62.959 kg). General: Well developed, well nourished, in no acute distress.  Head: Normocephalic, atraumatic, sclera non-icteric, mucus membranes are moist,   Neck: Supple. Carotids are 2 + without bruits. No JVD  Lungs: few wheezes and rhonchi bilaterally  Heart: regular rate.  normal  S1 S2. 2-3 / 6 systolic murmur.  There is a 1-2/6 diastolic murmur. Abdomen: Soft, non-tender, non-distended with normal bowel sounds. No hepatomegaly. No rebound/guarding. No masses.  Msk:  Strength and tone are normal  Extremities: No clubbing or cyanosis. No edema.  Distal pedal pulses are 2+ and equal bilaterally.  Neuro: Alert and oriented X 3. Moves all extremities spontaneously.  Psych:  Responds to questions appropriately with a normal affect.  ECG:  Assessment / Plan:

## 2011-08-24 NOTE — Patient Instructions (Signed)
Your physician recommends that you return for lab work in: today bnp bmet  Your physician has requested that you have an echocardiogram. Echocardiography is a painless test that uses sound waves to create images of your heart. It provides your doctor with information about the size and shape of your heart and how well your heart's chambers and valves are working. This procedure takes approximately one hour. There are no restrictions for this procedure.   Your physician has recommended you make the following change in your medication:   INCREASE LASIX FROM 20 MG TO 40 MG A DAY START POTASSIUM/ KDUR 10 MEQ DAILY WITH LASIX   Your physician recommends that you schedule a follow-up appointment in: 3 MONTHS WITH LAB DRAW BMET

## 2011-08-26 ENCOUNTER — Ambulatory Visit (HOSPITAL_COMMUNITY): Payer: Medicare Other | Attending: Internal Medicine

## 2011-08-26 DIAGNOSIS — I129 Hypertensive chronic kidney disease with stage 1 through stage 4 chronic kidney disease, or unspecified chronic kidney disease: Secondary | ICD-10-CM | POA: Insufficient documentation

## 2011-08-26 DIAGNOSIS — E785 Hyperlipidemia, unspecified: Secondary | ICD-10-CM | POA: Insufficient documentation

## 2011-08-26 DIAGNOSIS — I2510 Atherosclerotic heart disease of native coronary artery without angina pectoris: Secondary | ICD-10-CM | POA: Insufficient documentation

## 2011-08-26 DIAGNOSIS — R609 Edema, unspecified: Secondary | ICD-10-CM | POA: Insufficient documentation

## 2011-08-26 DIAGNOSIS — I059 Rheumatic mitral valve disease, unspecified: Secondary | ICD-10-CM

## 2011-08-26 DIAGNOSIS — I359 Nonrheumatic aortic valve disorder, unspecified: Secondary | ICD-10-CM | POA: Insufficient documentation

## 2011-08-26 DIAGNOSIS — R7989 Other specified abnormal findings of blood chemistry: Secondary | ICD-10-CM

## 2011-08-26 DIAGNOSIS — N189 Chronic kidney disease, unspecified: Secondary | ICD-10-CM | POA: Insufficient documentation

## 2011-08-26 DIAGNOSIS — I517 Cardiomegaly: Secondary | ICD-10-CM | POA: Insufficient documentation

## 2011-08-26 DIAGNOSIS — I00 Rheumatic fever without heart involvement: Secondary | ICD-10-CM | POA: Insufficient documentation

## 2011-08-26 DIAGNOSIS — Z8673 Personal history of transient ischemic attack (TIA), and cerebral infarction without residual deficits: Secondary | ICD-10-CM | POA: Insufficient documentation

## 2011-08-26 DIAGNOSIS — R0602 Shortness of breath: Secondary | ICD-10-CM

## 2011-08-26 DIAGNOSIS — R0989 Other specified symptoms and signs involving the circulatory and respiratory systems: Secondary | ICD-10-CM | POA: Insufficient documentation

## 2011-08-26 DIAGNOSIS — R0609 Other forms of dyspnea: Secondary | ICD-10-CM

## 2011-08-26 DIAGNOSIS — I509 Heart failure, unspecified: Secondary | ICD-10-CM

## 2011-08-26 DIAGNOSIS — I079 Rheumatic tricuspid valve disease, unspecified: Secondary | ICD-10-CM | POA: Insufficient documentation

## 2011-08-26 NOTE — Progress Notes (Signed)
Echocardiogram performed.  

## 2011-09-19 ENCOUNTER — Emergency Department (HOSPITAL_COMMUNITY): Payer: Medicare Other

## 2011-09-19 ENCOUNTER — Encounter (HOSPITAL_COMMUNITY): Payer: Self-pay | Admitting: Emergency Medicine

## 2011-09-19 ENCOUNTER — Inpatient Hospital Stay (HOSPITAL_COMMUNITY)
Admission: EM | Admit: 2011-09-19 | Discharge: 2011-09-24 | DRG: 194 | Disposition: A | Discharge status: Home / Self Care | Payer: Medicare Other | Attending: Internal Medicine | Admitting: Internal Medicine

## 2011-09-19 DIAGNOSIS — I2510 Atherosclerotic heart disease of native coronary artery without angina pectoris: Secondary | ICD-10-CM | POA: Diagnosis present

## 2011-09-19 DIAGNOSIS — J209 Acute bronchitis, unspecified: Secondary | ICD-10-CM | POA: Diagnosis present

## 2011-09-19 DIAGNOSIS — R079 Chest pain, unspecified: Secondary | ICD-10-CM

## 2011-09-19 DIAGNOSIS — Z7982 Long term (current) use of aspirin: Secondary | ICD-10-CM

## 2011-09-19 DIAGNOSIS — Z8249 Family history of ischemic heart disease and other diseases of the circulatory system: Secondary | ICD-10-CM

## 2011-09-19 DIAGNOSIS — Z79899 Other long term (current) drug therapy: Secondary | ICD-10-CM

## 2011-09-19 DIAGNOSIS — Z823 Family history of stroke: Secondary | ICD-10-CM

## 2011-09-19 DIAGNOSIS — J189 Pneumonia, unspecified organism: Principal | ICD-10-CM | POA: Diagnosis present

## 2011-09-19 DIAGNOSIS — R1319 Other dysphagia: Secondary | ICD-10-CM

## 2011-09-19 DIAGNOSIS — I5042 Chronic combined systolic (congestive) and diastolic (congestive) heart failure: Secondary | ICD-10-CM | POA: Diagnosis present

## 2011-09-19 DIAGNOSIS — K219 Gastro-esophageal reflux disease without esophagitis: Secondary | ICD-10-CM

## 2011-09-19 DIAGNOSIS — I2789 Other specified pulmonary heart diseases: Secondary | ICD-10-CM | POA: Diagnosis present

## 2011-09-19 DIAGNOSIS — I499 Cardiac arrhythmia, unspecified: Secondary | ICD-10-CM

## 2011-09-19 DIAGNOSIS — E785 Hyperlipidemia, unspecified: Secondary | ICD-10-CM | POA: Diagnosis present

## 2011-09-19 DIAGNOSIS — I509 Heart failure, unspecified: Secondary | ICD-10-CM | POA: Diagnosis present

## 2011-09-19 DIAGNOSIS — Z9861 Coronary angioplasty status: Secondary | ICD-10-CM

## 2011-09-19 DIAGNOSIS — E039 Hypothyroidism, unspecified: Secondary | ICD-10-CM | POA: Diagnosis present

## 2011-09-19 DIAGNOSIS — Z8679 Personal history of other diseases of the circulatory system: Secondary | ICD-10-CM

## 2011-09-19 DIAGNOSIS — J40 Bronchitis, not specified as acute or chronic: Secondary | ICD-10-CM

## 2011-09-19 DIAGNOSIS — Z85819 Personal history of malignant neoplasm of unspecified site of lip, oral cavity, and pharynx: Secondary | ICD-10-CM

## 2011-09-19 DIAGNOSIS — Z825 Family history of asthma and other chronic lower respiratory diseases: Secondary | ICD-10-CM

## 2011-09-19 DIAGNOSIS — R269 Unspecified abnormalities of gait and mobility: Secondary | ICD-10-CM | POA: Diagnosis present

## 2011-09-19 DIAGNOSIS — Z888 Allergy status to other drugs, medicaments and biological substances status: Secondary | ICD-10-CM

## 2011-09-19 DIAGNOSIS — N179 Acute kidney failure, unspecified: Secondary | ICD-10-CM | POA: Diagnosis present

## 2011-09-19 DIAGNOSIS — J309 Allergic rhinitis, unspecified: Secondary | ICD-10-CM

## 2011-09-19 DIAGNOSIS — I10 Essential (primary) hypertension: Secondary | ICD-10-CM

## 2011-09-19 DIAGNOSIS — K222 Esophageal obstruction: Secondary | ICD-10-CM

## 2011-09-19 DIAGNOSIS — I6350 Cerebral infarction due to unspecified occlusion or stenosis of unspecified cerebral artery: Secondary | ICD-10-CM

## 2011-09-19 DIAGNOSIS — I129 Hypertensive chronic kidney disease with stage 1 through stage 4 chronic kidney disease, or unspecified chronic kidney disease: Secondary | ICD-10-CM | POA: Diagnosis present

## 2011-09-19 DIAGNOSIS — Z8673 Personal history of transient ischemic attack (TIA), and cerebral infarction without residual deficits: Secondary | ICD-10-CM

## 2011-09-19 DIAGNOSIS — R1310 Dysphagia, unspecified: Secondary | ICD-10-CM | POA: Diagnosis present

## 2011-09-19 DIAGNOSIS — Z923 Personal history of irradiation: Secondary | ICD-10-CM

## 2011-09-19 DIAGNOSIS — K5730 Diverticulosis of large intestine without perforation or abscess without bleeding: Secondary | ICD-10-CM

## 2011-09-19 DIAGNOSIS — N189 Chronic kidney disease, unspecified: Secondary | ICD-10-CM | POA: Diagnosis present

## 2011-09-19 DIAGNOSIS — C801 Malignant (primary) neoplasm, unspecified: Secondary | ICD-10-CM

## 2011-09-19 LAB — URINALYSIS, ROUTINE W REFLEX MICROSCOPIC
Bilirubin Urine: NEGATIVE
Ketones, ur: NEGATIVE mg/dL
Leukocytes, UA: NEGATIVE
Nitrite: NEGATIVE
Protein, ur: NEGATIVE mg/dL
Urobilinogen, UA: 0.2 mg/dL (ref 0.0–1.0)

## 2011-09-19 LAB — CBC
MCH: 28.4 pg (ref 26.0–34.0)
MCV: 91.3 fL (ref 78.0–100.0)
Platelets: 150 10*3/uL (ref 150–400)
RBC: 4.01 MIL/uL (ref 3.87–5.11)
RDW: 14.4 % (ref 11.5–15.5)
WBC: 4.9 10*3/uL (ref 4.0–10.5)

## 2011-09-19 LAB — BASIC METABOLIC PANEL
BUN: 66 mg/dL — ABNORMAL HIGH (ref 6–23)
CO2: 31 mEq/L (ref 19–32)
Calcium: 10 mg/dL (ref 8.4–10.5)
Creatinine, Ser: 1.91 mg/dL — ABNORMAL HIGH (ref 0.50–1.10)
GFR calc non Af Amer: 21 mL/min — ABNORMAL LOW (ref >90)
Glucose, Bld: 79 mg/dL (ref 70–99)

## 2011-09-19 LAB — POCT I-STAT TROPONIN I

## 2011-09-19 MED ORDER — SODIUM CHLORIDE 0.9 % IV SOLN
20.0000 mL | INTRAVENOUS | Status: DC
  Administered 2011-09-19: 20 mL via INTRAVENOUS

## 2011-09-19 MED ORDER — HYDRALAZINE HCL 25 MG PO TABS
25.0000 mg | ORAL_TABLET | Freq: Once | ORAL | Status: AC
  Administered 2011-09-19: 25 mg via ORAL
  Filled 2011-09-19: qty 1

## 2011-09-19 MED ORDER — NITROGLYCERIN 0.4 MG SL SUBL
0.4000 mg | SUBLINGUAL_TABLET | SUBLINGUAL | Status: DC | PRN
  Administered 2011-09-19: 0.4 mg via SUBLINGUAL
  Filled 2011-09-19: qty 25

## 2011-09-19 MED ORDER — ASPIRIN 325 MG PO TABS
325.0000 mg | ORAL_TABLET | ORAL | Status: DC
  Filled 2011-09-19: qty 1

## 2011-09-19 MED ORDER — ISOSORBIDE DINITRATE 10 MG PO TABS
10.0000 mg | ORAL_TABLET | Freq: Two times a day (BID) | ORAL | Status: DC
  Administered 2011-09-19: 10 mg via ORAL
  Filled 2011-09-19 (×2): qty 1

## 2011-09-19 MED ORDER — ASPIRIN 81 MG PO CHEW
324.0000 mg | CHEWABLE_TABLET | Freq: Once | ORAL | Status: DC
  Filled 2011-09-19: qty 4

## 2011-09-19 NOTE — ED Notes (Signed)
Per family pt c/o chest pain and back pain on and off for 4 days. Pt BP in triage 202/76.

## 2011-09-19 NOTE — ED Provider Notes (Signed)
History     CSN: [id number]  Arrival date & time 09/19/11  1719   First MD Initiated Contact with Patient 09/19/11 1737      Chief Complaint  Patient presents with  . Chest Pain  . Back Pain    (Consider location/radiation/quality/duration/timing/severity/associated sxs/prior treatment) Patient is a 78 y.o. female presenting with chest pain and back pain. The history is provided by the patient, a caregiver and a relative.  Chest Pain The chest pain began 3 - 5 days ago. Duration of episode(s) is 5 minutes.    Back Pain  Associated symptoms include chest pain.   78 y/o famale INAD accompanied by her daughter c/o productive cough, chills,  and chest pain thoracic back pain worsening over the last 4 days. Back pain and SOB starting on  7/22. Chest pain is intermttent last for several minutes, non-exertional , left sided x4 days. Denies nausea and vomiting.   Stent placement in 1989  Past Medical History  Diagnosis Date  . Hyperlipidemia   . Hypertension   . Chronic kidney disease   . Hypothyroidism   . Cardiomegaly   . CHF (congestive heart failure)     Recent hospitalization for CHF & pneumonia -- EF is 40-45%  ( 03/16/10 by Dr. Peter [place] )    . Shortness of breath   . Dyspnea   . Stroke     history of   . History of rheumatic fever   . Head and neck cancer     status post radiation therapy  . Coronary artery disease     EF of 40-45% with history of angioplasty  . Pulmonary hypertension     with PA pressure estimated at 53 mmHg  . Pulmonary edema   . Hypoxia   . Aortic insufficiency     moderate-severe  . Arthritis   . Pneumonia     hx of  . Anemia     Past Surgical History  Procedure Date  . Tonsillectomy   . Thyroidectomy, partial   . Hammer toe surgery   . Refractive surgery     both eyes  . Coronary angioplasty     with angioplasty  . Joint replacement     bilat  . Knee surgery     Family History  Problem Relation Age of Onset  . Stroke  Father   . Ulcers Father   . Asthma Mother   . Cancer Brother   . Cancer Brother   . Cancer Brother   . Dementia Sister   . Congenital heart disease Sister   . Hypertension Son   . Hypertension Son   . Hypertension Son   . Hypertension Son   . Hypertension Son   . Hypertension Son   . Hypertension Son   . Hypertension Daughter   . Hypertension Daughter   . Hypertension Daughter     History  Substance Use Topics  . Smoking status: Never Smoker   . Smokeless tobacco: Never Used  . Alcohol Use: No    OB History    Grav Para Term Preterm Abortions TAB SAB Ect Mult Living                  Review of Systems  Cardiovascular: Positive for chest pain.  Musculoskeletal: Positive for back pain.    Allergies  Enoxaparin sodium; Hydrocodone-acetaminophen; and Oxycodone-acetaminophen  Home Medications   Current Outpatient Rx  Name Route Sig Dispense Refill  . ACETAMINOPHEN 500 MG PO TABS Oral  Take 500 mg by mouth as needed. Pain/fever    . CALCIUM CARBONATE ANTACID 500 MG PO CHEW Oral Chew 1 tablet by mouth 3 (three) times daily.      Marland Kitchen CARVEDILOL 6.25 MG PO TABS Oral Take 12.5 mg by mouth 2 (two) times daily with a meal.     . FUROSEMIDE 40 MG PO TABS Oral Take 1 tablet (40 mg total) by mouth daily. 30 tablet 5    Increased dose  . HYDRALAZINE HCL 25 MG PO TABS Oral Take 25 mg by mouth 3 (three) times daily.      . IRON PO Oral Take 1 tablet by mouth daily.     . ISOSORBIDE DINITRATE 10 MG PO TABS Oral Take 10 mg by mouth 2 (two) times daily.     Marland Kitchen LEVOTHYROXINE SODIUM 75 MCG PO TABS Oral Take 75 mcg by mouth daily.      Marland Kitchen MELATONIN 3 MG PO TABS Oral Take 2.5 mg by mouth at bedtime.     Marland Kitchen MUPIROCIN CALCIUM 2 % EX CREA Topical Apply 1 application topically 3 (three) times daily as needed.    Marland Kitchen OMEPRAZOLE 20 MG PO CPDR Oral Take 20 mg by mouth daily.      Marland Kitchen OXYMETAZOLINE HCL 0.05 % NA SOLN Nasal Place 2 sprays into the nose at bedtime.     Marland Kitchen PANTOPRAZOLE SODIUM 40 MG PO  TBEC Oral Take 40 mg by mouth daily.    Marland Kitchen POTASSIUM CHLORIDE CRYS ER 10 MEQ PO TBCR Oral Take 1 tablet (10 mEq total) by mouth daily. 30 tablet 5  . TEMAZEPAM 30 MG PO CAPS Oral Take 30 mg by mouth at bedtime.        BP 199/76  Pulse 82  Temp 98.5 F (36.9 C)  Resp 17  SpO2 98%  Physical Exam  Constitutional: She is oriented to person, place, and time. She appears well-developed and well-nourished. No distress.  HENT:  Head: Normocephalic and atraumatic.  Mouth/Throat: Oropharynx is clear and moist.  Eyes: Conjunctivae and EOM are normal. Pupils are equal, round, and reactive to light.  Neck: Normal range of motion. Neck supple. No JVD present.  Cardiovascular: Normal rate, regular rhythm and intact distal pulses.   Murmur heard.      Pulses 2+ x4, Systolic murmer  Pulmonary/Chest: Effort normal. No stridor. No respiratory distress. She has wheezes. She has no rales. She exhibits no tenderness.       Expiratory wheezes with upper airway rhonchi  Abdominal: Soft. She exhibits no distension and no mass. There is no tenderness. There is no rebound and no guarding.  Musculoskeletal: She exhibits no edema.  Neurological: She is alert and oriented to person, place, and time.  Skin: Skin is warm and dry. She is not diaphoretic.  Psychiatric: She has a normal mood and affect.    ED Course  Procedures (including critical care time)  Labs Reviewed  CBC - Abnormal; Notable for the following:    Hemoglobin 11.4 (*)     All other components within normal limits  URINALYSIS, ROUTINE W REFLEX MICROSCOPIC - Abnormal; Notable for the following:    APPearance CLOUDY (*)     All other components within normal limits  BASIC METABOLIC PANEL - Abnormal; Notable for the following:    BUN 66 (*)     Creatinine, Ser 1.91 (*)     GFR calc non Af Amer 21 (*)     GFR calc Af Amer 25 (*)  All other components within normal limits  D-DIMER, QUANTITATIVE - Abnormal; Notable for the following:     D-Dimer, Quant 1.70 (*)     All other components within normal limits  POCT I-STAT TROPONIN I  POCT I-STAT TROPONIN I   Chest Portable 1 View  09/19/2011  *RADIOLOGY REPORT*  Clinical Data: Chest pain.  Back pain.  PORTABLE CHEST - 1 VIEW  Comparison: 08/18/2011.  Findings: Cardiomegaly and pulmonary vascular congestion is present.  Bilateral basilar atelectasis and / or scarring is present.  No areas of focal consolidation.  Tortuous thoracic aorta.  Patient rotated to the right.  Chronic right rotator cuff tear. Monitoring leads are projected over the chest.  IMPRESSION: Chronic changes of the chest.  Cardiomegaly and pulmonary vascular congestion.  Original Report Authenticated By: Andreas Newport, M.D.     1. Chest pain   2. Bronchitis   3. Congestive heart failure, unspecified   4. Coronary atherosclerosis of unspecified type of vessel, native or graft       MDM  78 y/o female with chronically elevated BP c/o of thoracic back and chest pain with cough.   Date: 09/19/2011  Rate: 75  Rhythm: normal sinus rhythm  QRS Axis: normal  Intervals: normal  ST/T Wave abnormalities: nonspecific ST changes  Conduction Disutrbances:none  Narrative Interpretation:   Old EKG Reviewed: unchanged  EKG and troponin within normal limits. Chest x-ray also shows no infiltrates or pulmonary edema.   D-dimer is elevated like to order a VQ scan as her creatinine is above the level for a CTA. Patient should be admitted for observation based on age and multiple comorbidities           Wynetta Emery, PA-C 09/20/11 [phone]

## 2011-09-20 ENCOUNTER — Encounter (HOSPITAL_COMMUNITY): Payer: Self-pay | Admitting: Internal Medicine

## 2011-09-20 ENCOUNTER — Inpatient Hospital Stay (HOSPITAL_COMMUNITY): Payer: Medicare Other

## 2011-09-20 DIAGNOSIS — R079 Chest pain, unspecified: Secondary | ICD-10-CM | POA: Diagnosis present

## 2011-09-20 DIAGNOSIS — N179 Acute kidney failure, unspecified: Secondary | ICD-10-CM | POA: Diagnosis present

## 2011-09-20 DIAGNOSIS — I509 Heart failure, unspecified: Secondary | ICD-10-CM

## 2011-09-20 DIAGNOSIS — I2510 Atherosclerotic heart disease of native coronary artery without angina pectoris: Secondary | ICD-10-CM

## 2011-09-20 DIAGNOSIS — J40 Bronchitis, not specified as acute or chronic: Secondary | ICD-10-CM

## 2011-09-20 LAB — CBC WITH DIFFERENTIAL/PLATELET
Basophils Relative: 0 % (ref 0–1)
Eosinophils Absolute: 0.2 10*3/uL (ref 0.0–0.7)
HCT: 34.3 % — ABNORMAL LOW (ref 36.0–46.0)
Hemoglobin: 10.7 g/dL — ABNORMAL LOW (ref 12.0–15.0)
MCH: 28.6 pg (ref 26.0–34.0)
MCHC: 31.2 g/dL (ref 30.0–36.0)
MCV: 91.7 fL (ref 78.0–100.0)
Monocytes Absolute: 0.6 10*3/uL (ref 0.1–1.0)
Monocytes Relative: 14 % — ABNORMAL HIGH (ref 3–12)

## 2011-09-20 LAB — CARDIAC PANEL(CRET KIN+CKTOT+MB+TROPI)
CK, MB: 1.8 ng/mL (ref 0.3–4.0)
CK, MB: 1.5 ng/mL (ref 0.3–4.0)
Relative Index: INVALID (ref 0.0–2.5)
Total CK: 55 U/L (ref 7–177)
Total CK: 56 U/L (ref 7–177)
Total CK: 57 U/L (ref 7–177)

## 2011-09-20 LAB — COMPREHENSIVE METABOLIC PANEL
ALT: 10 U/L (ref 0–35)
AST: 19 U/L (ref 0–37)
CO2: 28 mEq/L (ref 19–32)
Calcium: 9.4 mg/dL (ref 8.4–10.5)
Sodium: 140 mEq/L (ref 135–145)
Total Protein: 7.1 g/dL (ref 6.0–8.3)

## 2011-09-20 MED ORDER — MOXIFLOXACIN HCL 400 MG PO TABS
400.0000 mg | ORAL_TABLET | Freq: Every day | ORAL | Status: DC
  Administered 2011-09-20 – 2011-09-23 (×4): 400 mg via ORAL
  Filled 2011-09-20 (×5): qty 1

## 2011-09-20 MED ORDER — SODIUM CHLORIDE 0.9 % IJ SOLN
3.0000 mL | Freq: Two times a day (BID) | INTRAMUSCULAR | Status: DC
  Administered 2011-09-20 – 2011-09-24 (×3): 3 mL via INTRAVENOUS

## 2011-09-20 MED ORDER — XENON XE 133 GAS
20.0000 | GAS_FOR_INHALATION | Freq: Once | RESPIRATORY_TRACT | Status: AC | PRN
  Administered 2011-09-20: 20 via RESPIRATORY_TRACT

## 2011-09-20 MED ORDER — ASPIRIN EC 325 MG PO TBEC
325.0000 mg | DELAYED_RELEASE_TABLET | Freq: Every day | ORAL | Status: DC
  Filled 2011-09-20: qty 1

## 2011-09-20 MED ORDER — ENSURE COMPLETE PO LIQD
237.0000 mL | Freq: Two times a day (BID) | ORAL | Status: DC
  Administered 2011-09-20 – 2011-09-24 (×7): 237 mL via ORAL

## 2011-09-20 MED ORDER — MOXIFLOXACIN HCL IN NACL 400 MG/250ML IV SOLN
400.0000 mg | INTRAVENOUS | Status: DC
  Administered 2011-09-20: 400 mg via INTRAVENOUS
  Filled 2011-09-20 (×2): qty 250

## 2011-09-20 MED ORDER — SODIUM CHLORIDE 0.9 % IJ SOLN
3.0000 mL | Freq: Two times a day (BID) | INTRAMUSCULAR | Status: DC
  Administered 2011-09-20 – 2011-09-24 (×5): 3 mL via INTRAVENOUS

## 2011-09-20 MED ORDER — POTASSIUM CHLORIDE CRYS ER 10 MEQ PO TBCR
10.0000 meq | EXTENDED_RELEASE_TABLET | Freq: Every day | ORAL | Status: DC
  Administered 2011-09-20 – 2011-09-24 (×5): 10 meq via ORAL
  Filled 2011-09-20 (×5): qty 1

## 2011-09-20 MED ORDER — ONDANSETRON HCL 4 MG PO TABS: 4.0000 mg | ORAL_TABLET | Freq: Four times a day (QID) | ORAL | Status: DC | PRN

## 2011-09-20 MED ORDER — CARVEDILOL 12.5 MG PO TABS
12.5000 mg | ORAL_TABLET | Freq: Two times a day (BID) | ORAL | Status: DC
  Administered 2011-09-20 – 2011-09-24 (×9): 12.5 mg via ORAL
  Filled 2011-09-20 (×11): qty 1

## 2011-09-20 MED ORDER — TEMAZEPAM 15 MG PO CAPS
30.0000 mg | ORAL_CAPSULE | Freq: Every evening | ORAL | Status: DC | PRN
  Administered 2011-09-20 – 2011-09-23 (×4): 30 mg via ORAL
  Filled 2011-09-20: qty 1
  Filled 2011-09-20 (×3): qty 2
  Filled 2011-09-20: qty 1

## 2011-09-20 MED ORDER — TECHNETIUM TO 99M ALBUMIN AGGREGATED
3.0000 | Freq: Once | INTRAVENOUS | Status: AC | PRN
  Administered 2011-09-20: 3 via INTRAVENOUS

## 2011-09-20 MED ORDER — ONDANSETRON HCL 4 MG/2ML IJ SOLN: 4.0000 mg | Freq: Four times a day (QID) | INTRAMUSCULAR | Status: DC | PRN

## 2011-09-20 MED ORDER — ACETAMINOPHEN 650 MG RE SUPP: 650.0000 mg | Freq: Four times a day (QID) | RECTAL | Status: DC | PRN

## 2011-09-20 MED ORDER — FUROSEMIDE 40 MG PO TABS
40.0000 mg | ORAL_TABLET | Freq: Every day | ORAL | Status: DC
  Administered 2011-09-20 – 2011-09-21 (×2): 40 mg via ORAL
  Filled 2011-09-20 (×2): qty 1

## 2011-09-20 MED ORDER — ZOLPIDEM TARTRATE 5 MG PO TABS: 5.0000 mg | ORAL_TABLET | Freq: Every day | ORAL | Status: DC

## 2011-09-20 MED ORDER — LEVOTHYROXINE SODIUM 75 MCG PO TABS
75.0000 ug | ORAL_TABLET | Freq: Every day | ORAL | Status: DC
  Administered 2011-09-20 – 2011-09-24 (×5): 75 ug via ORAL
  Filled 2011-09-20 (×6): qty 1

## 2011-09-20 MED ORDER — ISOSORBIDE DINITRATE 10 MG PO TABS
10.0000 mg | ORAL_TABLET | Freq: Two times a day (BID) | ORAL | Status: DC
  Administered 2011-09-20 – 2011-09-24 (×9): 10 mg via ORAL
  Filled 2011-09-20 (×10): qty 1

## 2011-09-20 MED ORDER — PANTOPRAZOLE SODIUM 40 MG PO TBEC
40.0000 mg | DELAYED_RELEASE_TABLET | Freq: Every day | ORAL | Status: DC
  Administered 2011-09-20 – 2011-09-24 (×5): 40 mg via ORAL
  Filled 2011-09-20 (×5): qty 1

## 2011-09-20 MED ORDER — HYDRALAZINE HCL 25 MG PO TABS
25.0000 mg | ORAL_TABLET | Freq: Two times a day (BID) | ORAL | Status: DC
  Administered 2011-09-20 – 2011-09-24 (×8): 25 mg via ORAL
  Filled 2011-09-20 (×10): qty 1

## 2011-09-20 MED ORDER — MELATONIN 3 MG PO TABS: 6.0000 mg | ORAL_TABLET | Freq: Every day | ORAL | Status: DC

## 2011-09-20 MED ORDER — ACETAMINOPHEN 325 MG PO TABS: 650.0000 mg | ORAL_TABLET | Freq: Four times a day (QID) | ORAL | Status: DC | PRN

## 2011-09-20 NOTE — Progress Notes (Signed)
INITIAL ADULT NUTRITION ASSESSMENT Date: 09/20/2011   Time: 11:03 AM Reason for Assessment: Nutrition Risk   INTERVENTION: 1. Ensure Complete po BID, each supplement provides 350 kcal and 13 grams of protein. 2. Downgrade to dysphagia 3, add food preferences  3. Pt may benefit from a swallow evaluation  4. RD will continue to follow    ASSESSMENT: Female 78 y.o.  Dx: Chest pain  Hx:  Past Medical History  Diagnosis Date  . Hyperlipidemia   . Hypertension   . Chronic kidney disease   . Hypothyroidism   . Cardiomegaly   . CHF (congestive heart failure)     Recent hospitalization for CHF & pneumonia -- EF is 40-45%  ( 03/16/10 by Dr. Peter [place] )    . Shortness of breath   . Dyspnea   . Stroke     history of   . History of rheumatic fever   . Head and neck cancer     status post radiation therapy  . Coronary artery disease     EF of 40-45% with history of angioplasty  . Pulmonary hypertension     with PA pressure estimated at 53 mmHg  . Pulmonary edema   . Hypoxia   . Aortic insufficiency     moderate-severe  . Arthritis   . Pneumonia     hx of  . Anemia     Related Meds:     . aspirin  324 mg Oral Once  . carvedilol  12.5 mg Oral BID WC  . furosemide  40 mg Oral Daily  . hydrALAZINE  25 mg Oral Once  . hydrALAZINE  25 mg Oral BID  . isosorbide dinitrate  10 mg Oral BID  . levothyroxine  75 mcg Oral Q0600  . moxifloxacin  400 mg Intravenous Q24H  . pantoprazole  40 mg Oral Q1200  . potassium chloride  10 mEq Oral Daily  . sodium chloride  3 mL Intravenous Q12H  . sodium chloride  3 mL Intravenous Q12H  . zolpidem  5 mg Oral QHS  . DISCONTD: aspirin EC  325 mg Oral Daily  . DISCONTD: aspirin  325 mg Oral STAT  . DISCONTD: isosorbide dinitrate  10 mg Oral BID  . DISCONTD: Melatonin  6 mg Oral QHS     Ht: 5' (152.4 cm)  Wt: 128 lb 12 oz (58.4 kg) (bed scale)  Ideal Wt: 45.5 kg  % Ideal Wt: 128%  Usual Wt:  Wt Readings from Last 10 Encounters:    09/20/11 128 lb 12 oz (58.4 kg)  08/24/11 138 lb 12.8 oz (62.959 kg)  12/29/10 140 lb (63.504 kg)  12/29/10 140 lb (63.504 kg)  08/04/10 137 lb 9.6 oz (62.415 kg)  03/16/10 137 lb (62.143 kg)  09/15/09 143 lb (64.864 kg)    % Usual Wt: 93%  Body mass index is 25.14 kg/(m^2). 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   Labs:  CMP     Component Value Date/Time   NA 140 09/20/2011 0637   K 4.1 09/20/2011 0637   CL 99 09/20/2011 0637   CO2 28 09/20/2011 0637   GLUCOSE 109* 09/20/2011 0637   BUN 72* 09/20/2011 0637   CREATININE 2.05* 09/20/2011 0637   CALCIUM 9.4 09/20/2011 0637   PROT 7.1 09/20/2011 0637   ALBUMIN 3.2* 09/20/2011 0637   AST 19 09/20/2011 0637   ALT  10 09/20/2011 0637   ALKPHOS 68 09/20/2011 0637   BILITOT 0.2* 09/20/2011 0637   GFRNONAA 20* 09/20/2011 0637   GFRAA 23* 09/20/2011 1478     Intake/Output Summary (Last 24 hours) at 09/20/11 1127 Last data filed at 09/20/11 2956  Gross per 24 hour  Intake    600 ml  Output    600 ml  Net      0 ml    Diet Order: Cardiac  Supplements/Tube Feeding: none  IVF:    DISCONTD: sodium chloride Last Rate: 20 mL (09/19/11 1816)    Estimated Nutritional Needs:   Kcal: 1200-1400 Protein:  60-70 gm  Fluid:  >/= 1.5 L   Pt reports that she has problems swallowing tough foods, does not eat tough meats or any beef products.  Per weight hx pt has had 10 lb weight loss over about 1 month (7.2%), severe weight loss. Pt reports that she drinks Ensure daily.   NUTRITION DIAGNOSIS: -Swallowing difficulty (NI-1.1).  Status: Ongoing  RELATED TO: hx of cancer with radiation therapy   AS EVIDENCE BY: pt report of difficulty with tough meats and weight loss   MONITORING/EVALUATION(Goals): Goal: PO intake of meals and supplements to meet >90% of estimated nutrition needs   Monitor: PO intake, weight, labs, I/O's  EDUCATION  NEEDS: -No education needs identified at this time   DOCUMENTATION CODES Per approved criteria  -Not Applicable    Clarene Duke RD, LDN Pager [phone] After Hours pager [phone]

## 2011-09-20 NOTE — H&P (Signed)
[name] is an 78 y.o. female.   Patient was seen and examined on September 20, 2011 at 3:10 AM. PCP - Dr. Lupe Carney. Chief Complaint: Chest pain and back pain. HPI: 78 year old female with history of CAD status post angioplasty, CHF last EF measured July 2013 was 45-50%, hypertension, pulmonary hypertension, chronic kidney disease, history of nasopharyngeal carcinoma status post radiation more than 7 years ago has been experiencing chest pain and right-sided upper back pain over the last 2 weeks. The pain happens suddenly and lasts a few seconds. It is stabbing type increased on deep inspiration. Patient also noticed increased productive cough over the last few days. Denies any fever chills or nausea vomiting or abdominal pain. Her pain became more increasing in duration and decided to come to the ER. EKG and cardiac enzymes are negative. Patient is afebrile. Chest x-ray shows congestion. D-dimer is mildly elevated. Patient has been admitted for further workup including getting a VQ scan given the patient has increased creatinine and unable to do CT chest angiogram protocol. Presently chest pain-free.   Past Medical History  Diagnosis Date  . Hyperlipidemia   . Hypertension   . Chronic kidney disease   . Hypothyroidism   . Cardiomegaly   . CHF (congestive heart failure)     Recent hospitalization for CHF & pneumonia -- EF is 40-45%  ( 03/16/10 by Dr. Peter [place] )    . Shortness of breath   . Dyspnea   . Stroke     history of   . History of rheumatic fever   . Head and neck cancer     status post radiation therapy  . Coronary artery disease     EF of 40-45% with history of angioplasty  . Pulmonary hypertension     with PA pressure estimated at 53 mmHg  . Pulmonary edema   . Hypoxia   . Aortic insufficiency     moderate-severe  . Arthritis   . Pneumonia     hx of  . Anemia     Past Surgical History  Procedure Date  . Tonsillectomy   . Thyroidectomy, partial   . Hammer  toe surgery   . Refractive surgery     both eyes  . Coronary angioplasty     with angioplasty  . Joint replacement     bilat  . Knee surgery     Family History  Problem Relation Age of Onset  . Stroke Father   . Ulcers Father   . Asthma Mother   . Cancer Brother   . Cancer Brother   . Cancer Brother   . Dementia Sister   . Congenital heart disease Sister   . Hypertension Son   . Hypertension Son   . Hypertension Son   . Hypertension Son   . Hypertension Son   . Hypertension Son   . Hypertension Son   . Hypertension Daughter   . Hypertension Daughter   . Hypertension Daughter    Social History:  reports that she has never smoked. She has never used smokeless tobacco. She reports that she does not drink alcohol or use illicit drugs.  Allergies:  Allergies  Allergen Reactions  . Enoxaparin Sodium     Unknown  . Hydrocodone-Acetaminophen     syncope  . Oxycodone-Acetaminophen     Unknown    Medications Prior to Admission  Medication Sig Dispense Refill  . acetaminophen (TYLENOL) 500 MG tablet Take 500 mg by mouth as needed. Pain/fever      .  calcium carbonate (TUMS - DOSED IN MG ELEMENTAL CALCIUM) 500 MG chewable tablet Chew 1 tablet by mouth 2 (two) times daily.       . carvedilol (COREG) 6.25 MG tablet Take 12.5 mg by mouth 2 (two) times daily with a meal.       . furosemide (LASIX) 40 MG tablet Take 1 tablet (40 mg total) by mouth daily.  30 tablet  5  . hydrALAZINE (APRESOLINE) 25 MG tablet Take 25 mg by mouth 2 (two) times daily.       . isosorbide dinitrate (ISORDIL) 10 MG tablet Take 10 mg by mouth 2 (two) times daily.       Marland Kitchen levothyroxine (SYNTHROID, LEVOTHROID) 75 MCG tablet Take 75 mcg by mouth daily.        . Melatonin 3 MG TABS Take 6 mg by mouth at bedtime.       Marland Kitchen oxymetazoline (AFRIN) 0.05 % nasal spray Place 2 sprays into the nose at bedtime.       . pantoprazole (PROTONIX) 40 MG tablet Take 40 mg by mouth daily.      . potassium chloride  (K-DUR,KLOR-CON) 10 MEQ tablet Take 1 tablet (10 mEq total) by mouth daily.  30 tablet  5  . temazepam (RESTORIL) 30 MG capsule Take 30 mg by mouth at bedtime.          Results for orders placed during the hospital encounter of 09/19/11 (from the past 48 hour(s))  CBC     Status: Abnormal   Collection Time   09/19/11  5:53 PM      Component Value Range Comment   WBC 4.9  4.0 - 10.5 K/uL    RBC 4.01  3.87 - 5.11 MIL/uL    Hemoglobin 11.4 (*) 12.0 - 15.0 g/dL    HCT 40.9  81.1 - 91.4 %    MCV 91.3  78.0 - 100.0 fL    MCH 28.4  26.0 - 34.0 pg    MCHC 31.1  30.0 - 36.0 g/dL    RDW 78.2  95.6 - 21.3 %    Platelets 150  150 - 400 K/uL   POCT I-STAT TROPONIN I     Status: Normal   Collection Time   09/19/11  6:19 PM      Component Value Range Comment   Troponin i, poc 0.01  0.00 - 0.08 ng/mL    Comment 3            URINALYSIS, ROUTINE W REFLEX MICROSCOPIC     Status: Abnormal   Collection Time   09/19/11  7:13 PM      Component Value Range Comment   Color, Urine YELLOW  YELLOW    APPearance CLOUDY (*) CLEAR    Specific Gravity, Urine 1.010  1.005 - 1.030    pH 6.0  5.0 - 8.0    Glucose, UA NEGATIVE  NEGATIVE mg/dL    Hgb urine dipstick NEGATIVE  NEGATIVE    Bilirubin Urine NEGATIVE  NEGATIVE    Ketones, ur NEGATIVE  NEGATIVE mg/dL    Protein, ur NEGATIVE  NEGATIVE mg/dL    Urobilinogen, UA 0.2  0.0 - 1.0 mg/dL    Nitrite NEGATIVE  NEGATIVE    Leukocytes, UA NEGATIVE  NEGATIVE MICROSCOPIC NOT DONE ON URINES WITH NEGATIVE PROTEIN, BLOOD, LEUKOCYTES, NITRITE, OR GLUCOSE <1000 mg/dL.  BASIC METABOLIC PANEL     Status: Abnormal   Collection Time   09/19/11  9:34 PM  Component Value Range Comment   Sodium 141  135 - 145 mEq/L    Potassium 3.9  3.5 - 5.1 mEq/L    Chloride 99  96 - 112 mEq/L    CO2 31  19 - 32 mEq/L    Glucose, Bld 79  70 - 99 mg/dL    BUN 66 (*) 6 - 23 mg/dL    Creatinine, Ser 6.64 (*) 0.50 - 1.10 mg/dL    Calcium 40.3  8.4 - 10.5 mg/dL    GFR calc non Af Amer 21  (*) >90 mL/min    GFR calc Af Amer 25 (*) >90 mL/min   POCT I-STAT TROPONIN I     Status: Normal   Collection Time   09/19/11  9:40 PM      Component Value Range Comment   Troponin i, poc 0.01  0.00 - 0.08 ng/mL    Comment 3            D-DIMER, QUANTITATIVE     Status: Abnormal   Collection Time   09/19/11 10:12 PM      Component Value Range Comment   D-Dimer, Quant 1.70 (*) 0.00 - 0.48 ug/mL-FEU    Chest Portable 1 View  09/19/2011  *RADIOLOGY REPORT*  Clinical Data: Chest pain.  Back pain.  PORTABLE CHEST - 1 VIEW  Comparison: 08/18/2011.  Findings: Cardiomegaly and pulmonary vascular congestion is present.  Bilateral basilar atelectasis and / or scarring is present.  No areas of focal consolidation.  Tortuous thoracic aorta.  Patient rotated to the right.  Chronic right rotator cuff tear. Monitoring leads are projected over the chest.  IMPRESSION: Chronic changes of the chest.  Cardiomegaly and pulmonary vascular congestion.  Original Report Authenticated By: Andreas Newport, M.D.    Review of Systems  Constitutional: Negative.   HENT: Negative.   Eyes: Negative.   Respiratory: Positive for cough, sputum production and shortness of breath.   Cardiovascular: Positive for chest pain.  Gastrointestinal: Negative.   Genitourinary: Negative.   Musculoskeletal:       Right upper back pain.  Skin: Negative.   Neurological: Negative.   Endo/Heme/Allergies: Negative.   Psychiatric/Behavioral: Negative.     Blood pressure 160/59, pulse 88, temperature 99.2 F (37.3 C), temperature source Oral, resp. rate 18, height 5' (1.524 m), weight 58.4 kg (128 lb 12 oz), SpO2 100.00%. Physical Exam  Constitutional: She is oriented to person, place, and time. She appears well-developed and well-nourished. No distress.  HENT:  Head: Normocephalic and atraumatic.  Eyes: Conjunctivae are normal. Pupils are equal, round, and reactive to light. Right eye exhibits no discharge. Left eye exhibits no  discharge. No scleral icterus.  Neck: Normal range of motion. Neck supple.  Cardiovascular: Normal rate and regular rhythm.   Respiratory: Effort normal and breath sounds normal. No respiratory distress. She has no wheezes. She has no rales.  GI: Soft. Bowel sounds are normal. She exhibits no distension. There is no tenderness.  Musculoskeletal: Normal range of motion. She exhibits no edema and no tenderness.  Neurological: She is alert and oriented to person, place, and time.       Moves all extremities.  Skin: Skin is warm and dry. She is not diaphoretic.  Psychiatric: Her behavior is normal.     Assessment/Plan #1. Chest pain with history of CAD status post angioplasty  - chest pain looks atypical. Cycle cardiac markers. May be related to possible pneumonic process as patient has productive cough and pain is more located  in the right upper back. Have to rule out PE for which VQ scan has been ordered.  #2. Possible pneumonia versus bronchitis - patient has been started on Avelox. Patient does have significant productive cough even while I was examining. Not in acute distress. #3. Acute renal failure on chronic kidney disease - closely follow intake output and metabolic panel. For now I am continuing patient's regular dose of Lasix. #4. History of CHF last EF measured last month was 45 and 50% - continue present medications. #5. Hypertension - continue present medications. #6. History of nasal pharyngeal carcinoma status post radiation.  CODE STATUS - full code  KAKRAKANDY,ARSHAD N. 09/20/2011, 3:46 AM

## 2011-09-20 NOTE — Progress Notes (Deleted)
Pt A/O x 3, pt independent, pt had 5 beat run of V-tach, pt checked on, no c/o pain, pt  Asymptomatic, pt has had runs of V-tach in the past MD's are aware, no s/s of distress, will continue to monitor Worthy Flank, RN

## 2011-09-20 NOTE — Evaluation (Signed)
Physical Therapy Evaluation Patient Details Name: [name] MRN: [id number] DOB: Aug 22, 1915 Today's Date: 09/20/2011 Time: 7829-5621 PT Time Calculation (min): 35 min  PT Assessment / Plan / Recommendation Clinical Impression  pt presents with Bronchitis and CP.  pt very pleasant and agreeable to OOB despite fatigue from being in ER last night.  pt has good family support and would benefit from HHPT at D/C.      PT Assessment  Patient needs continued PT services    Follow Up Recommendations  Home health PT;Supervision/Assistance - 24 hour    Barriers to Discharge None      Equipment Recommendations  Defer to next venue    Recommendations for Other Services     Frequency Min 3X/week    Precautions / Restrictions Precautions Precautions: Fall Restrictions Weight Bearing Restrictions: No   Pertinent Vitals/Pain Denies pain.        Mobility  Bed Mobility Bed Mobility: Supine to Sit;Sitting - Scoot to Edge of Bed;Sit to Supine Supine to Sit: 5: Supervision;With rails Sitting - Scoot to Edge of Bed: 5: Supervision Sit to Supine: 5: Supervision Details for Bed Mobility Assistance: pt moves slowly, but able to complete without A.   Transfers Transfers: Sit to Stand;Stand to Sit Sit to Stand: 4: Min assist;With upper extremity assist;From bed;From chair/3-in-1 Stand to Sit: 4: Min guard;With upper extremity assist;To chair/3-in-1;To bed Details for Transfer Assistance: pt moves slowly and needs cues to use UEs.   Ambulation/Gait Ambulation/Gait Assistance: 4: Min guard Ambulation Distance (Feet): 20 Feet Assistive device: Rolling walker Ambulation/Gait Assistance Details: cues to stay closer to RW, upright posture Gait Pattern: Step-through pattern;Decreased stride length Stairs: No Wheelchair Mobility Wheelchair Mobility: No    Exercises     PT Diagnosis: Difficulty walking  PT Problem List: Decreased activity tolerance;Decreased balance;Decreased  mobility;Decreased knowledge of use of DME PT Treatment Interventions: DME instruction;Gait training;Functional mobility training;Therapeutic exercise;Therapeutic activities;Balance training;Patient/family education   PT Goals Acute Rehab PT Goals PT Goal Formulation: With patient Time For Goal Achievement: 10/04/11 Potential to Achieve Goals: Good Pt will go Supine/Side to Sit: with modified independence PT Goal: Supine/Side to Sit - Progress: Goal set today Pt will go Sit to Supine/Side: with modified independence PT Goal: Sit to Supine/Side - Progress: Goal set today Pt will go Sit to Stand: with modified independence PT Goal: Sit to Stand - Progress: Goal set today Pt will go Stand to Sit: with modified independence PT Goal: Stand to Sit - Progress: Goal set today Pt will Ambulate: >150 feet;with modified independence;with rolling walker PT Goal: Ambulate - Progress: Goal set today  Visit Information  Last PT Received On: 09/20/11 Assistance Needed: +1    Subjective Data  Subjective: I know that its now my time yet.   Patient Stated Goal: Home   Prior Functioning  Home Living Lives With: Alone Available Help at Discharge: Family (Can be 24/7 if needed.  ) Type of Home: House Home Access: Ramped entrance Home Layout: One level Bathroom Shower/Tub: Health visitor: Standard Bathroom Accessibility: Yes How Accessible: Accessible via walker Home Adaptive Equipment: Bedside commode/3-in-1;Hand-held shower hose;Walker - rolling;Shower chair with back;Wheelchair - manual;Grab bars in shower (Oxygen) Prior Function Level of Independence: Needs assistance Needs Assistance: Light Housekeeping;Gait Light Housekeeping: Maximal Gait Assistance: uses RW.   Able to Take Stairs?: No Driving: No Vocation: Retired Musician: No difficulties    Cognition  Overall Cognitive Status: Appears within functional limits for tasks  assessed/performed Arousal/Alertness: Awake/alert Orientation Level: Oriented  X4 / Intact Behavior During Session: [facility] for tasks performed    Extremity/Trunk Assessment Right Lower Extremity Assessment RLE ROM/Strength/Tone: WFL for tasks assessed RLE Sensation: WFL - Light Touch Left Lower Extremity Assessment LLE ROM/Strength/Tone: WFL for tasks assessed LLE Sensation: WFL - Light Touch Trunk Assessment Trunk Assessment: Kyphotic   Balance Balance Balance Assessed: No  End of Session PT - End of Session Equipment Utilized During Treatment: Gait belt Activity Tolerance: Patient tolerated treatment well Patient left: in bed;with call bell/phone within reach Nurse Communication: Mobility status  GP     Sunny Schlein,  [phone] 09/20/2011, 3:38 PM

## 2011-09-20 NOTE — Progress Notes (Signed)
Patient ID: ANTONELLA UPSON, female   DOB: [birth year], 78 y.o.   MRN: [id number]  Please see detailed H&P note completed earlier today upon admission by Dr. Toniann Fail.  This is an addendum to H&P in an attempt to document pt's clinical progress since admission.  I have seen and examined the pt at bedside and discussed the current diagnosis and plan/management to continue with pt and family members at bedside. Pt is clinical and hemodynamically stable. Denies chest pain or shortness of breath at this time. No abdominal or urinary concerns.  Principal Problem:  *Chest pain - unclear etiology at this time and ? Related to PNA vs CHF - cardiac enzymes x 2 sets negative and no acute events on telemetry noted so far - will continue to monitor pt on telemetry and will monitor vitals per floor protocol - V/Q scan negative for PE  Active Problems:  Bronchitis - clinical symptoms, low grade fever noted in the hospital and productive cough suggestive of an infectious etiology - I agree with continuing Avelox   Acute on chronic renal failure - in the setting of chronic diuretic use - watch closely and readjust the dose of Lasix as indicated - BMP in AM   CORONARY ARTERY DISEASE - continue Aspirin, coreg, isosorbide as per home medication regimen   CONGESTIVE HEART FAILURE - recent 2 D ECHO 08/2011 consistent with combined diastolic and systolic CHF and pt is on Lasix at home - will continue Lasix but monitor BMP daily while diuresing - strict I's and O's, daily weights  DVT prophylaxis: SCD Code: Full  Debbora Presto, MD  [facility] Pager [phone]  If 7PM-7AM, please contact night-coverage [email or web address] Password [email or web address]

## 2011-09-21 DIAGNOSIS — I10 Essential (primary) hypertension: Secondary | ICD-10-CM

## 2011-09-21 DIAGNOSIS — N179 Acute kidney failure, unspecified: Secondary | ICD-10-CM

## 2011-09-21 LAB — CBC
HCT: 32 % — ABNORMAL LOW (ref 36.0–46.0)
MCHC: 31.9 g/dL (ref 30.0–36.0)
MCV: 90.9 fL (ref 78.0–100.0)
RDW: 14.8 % (ref 11.5–15.5)

## 2011-09-21 LAB — BASIC METABOLIC PANEL
BUN: 75 mg/dL — ABNORMAL HIGH (ref 6–23)
Chloride: 100 mEq/L (ref 96–112)
Creatinine, Ser: 2.24 mg/dL — ABNORMAL HIGH (ref 0.50–1.10)
GFR calc Af Amer: 20 mL/min — ABNORMAL LOW (ref >90)
GFR calc non Af Amer: 18 mL/min — ABNORMAL LOW (ref >90)

## 2011-09-21 NOTE — Progress Notes (Signed)
Physical Therapy Treatment Patient Details Name: [name] MRN: [id number] DOB: 07-28-1915 Today's Date: 09/21/2011 Time: 4098-1191 PT Time Calculation (min): 28 min  PT Assessment / Plan / Recommendation Comments on Treatment Session  Pt. progressing well and should continue to improve her mobility with ongoing acute then HHPT.  Pt. is highly motivated and determined to continue on, wanting to get back home to make yeast bread.  Family plans 24 hour assist upon DC.    Follow Up Recommendations  Home health PT;Supervision/Assistance - 24 hour    Barriers to Discharge        Equipment Recommendations  None recommended by PT    Recommendations for Other Services    Frequency Min 3X/week   Plan Discharge plan remains appropriate    Precautions / Restrictions Precautions Precautions: Fall Restrictions Weight Bearing Restrictions: No   Pertinent Vitals/Pain No distress, no pain    Mobility  Bed Mobility Bed Mobility: Sitting - Scoot to Edge of Bed;Supine to Sit Supine to Sit: 5: Supervision;With rails Sitting - Scoot to Edge of Bed: 5: Supervision Details for Bed Mobility Assistance: increased time needed but pt. can complete without assist Transfers Transfers: Sit to Stand;Stand to Sit Sit to Stand: 4: Min assist;With upper extremity assist;From bed Stand to Sit: 4: Min guard;With upper extremity assist;To chair/3-in-1 Details for Transfer Assistance: pt moves slowly and needs cues to use UEs.   Ambulation/Gait Ambulation/Gait Assistance: 4: Min guard Ambulation Distance (Feet): 100 Feet Assistive device: Rolling walker Ambulation/Gait Assistance Details: Pt. with slow pace and decreased stride.  Needed min assist for stability and safety.  No overt LOB noted.   Gait Pattern: Step-through pattern;Decreased stride length Stairs: No Wheelchair Mobility Wheelchair Mobility: No    Exercises     PT Diagnosis:    PT Problem List:   PT Treatment Interventions:     PT  Goals Acute Rehab PT Goals PT Goal: Supine/Side to Sit - Progress: Progressing toward goal PT Goal: Sit to Stand - Progress: Progressing toward goal PT Goal: Stand to Sit - Progress: Progressing toward goal PT Goal: Ambulate - Progress: Progressing toward goal  Visit Information  Last PT Received On: 09/21/11 Assistance Needed: +1    Subjective Data  Subjective: I feel like being lazy today   Cognition  Overall Cognitive Status: Appears within functional limits for tasks assessed/performed Arousal/Alertness: Awake/alert Orientation Level: Oriented X4 / Intact Behavior During Session: [facility] for tasks performed    Balance     End of Session PT - End of Session Equipment Utilized During Treatment: Gait belt Activity Tolerance: Patient tolerated treatment well Patient left: in chair;with call bell/phone within reach;with family/visitor present Nurse Communication: Mobility status   GP     Ferman Hamming 09/21/2011, 11:50 AM Weldon Picking PT Acute Rehab Services [phone] [place] [phone]

## 2011-09-21 NOTE — Progress Notes (Signed)
Subjective: Patient states that she's feeling well and has no complaints. Patient anxious to be discharged home however explain to patient that she's had a worsening of her renal function during this hospitalization. Patient notes that she's had an increase in her Lasix between admissions. A review of her 2-D echocardiogram shows that the patient has pulmonary hypertension as well as tricuspid regurgitation. Objective: Filed Vitals:   09/21/11 0500 09/21/11 0656 09/21/11 1049 09/21/11 1416  BP: 100/52 126/47 92/50 110/62  Pulse: 73 70  74  Temp:  98 F (36.7 C)  98.5 F (36.9 C)  TempSrc:  Oral  Oral  Resp:  18  16  Height:      Weight:  58.06 kg (128 lb)    SpO2:  93%  96%   Weight change: -0.34 kg (-12 oz)  Intake/Output Summary (Last 24 hours) at 09/21/11 1726 Last data filed at 09/21/11 1708  Gross per 24 hour  Intake   1180 ml  Output    925 ml  Net    255 ml    General: Alert, awake, oriented x3, in no acute distress. Patient's daughter is a bedside HEENT: /AT PEERL, EOMI Neck: Trachea midline,  no masses, no thyromegal,y no JVD, no carotid bruit OROPHARYNX:  Moist, No exudate/ erythema/lesions.  Heart: Regular rate and rhythm, without murmurs, rubs, gallops, PMI non-displaced, no heaves or thrills on palpation.  Lungs: Clear to auscultation.  Abdomen: Soft, nontender, nondistended, positive bowel sounds, no masses no hepatosplenomegaly noted..  Neuro: No focal neurological deficits noted cranial nerves II through XII grossly intact. DTRs 2+ bilaterally upper and lower extremities. Strength functional in bilateral upper and lower extremities. Musculoskeletal: No warm swelling or erythema around joints, no spinal tenderness noted.   Lab Results:  [facility] 09/21/11 0545 09/20/11 0637  NA 141 140  K 4.0 4.1  CL 100 99  CO2 32 28  GLUCOSE 101* 109*  BUN 75* 72*  CREATININE 2.24* 2.05*  CALCIUM 9.2 9.4  MG -- --  PHOS -- --    Basename 09/20/11 0637  AST 19    ALT 10  ALKPHOS 68  BILITOT 0.2*  PROT 7.1  ALBUMIN 3.2*   No results found for this basename: LIPASE:2,AMYLASE:2 in the last 72 hours  Basename 09/21/11 0545 09/20/11 0637  WBC 5.4 4.1  NEUTROABS -- 2.8  HGB 10.2* 10.7*  HCT 32.0* 34.3*  MCV 90.9 91.7  PLT 124* 132*    Basename 09/20/11 1853 09/20/11 0951 09/20/11 0637  CKTOTAL 57 56 55  CKMB 1.5 1.7 1.8  CKMBINDEX -- -- --  TROPONINI <0.30 <0.30 <0.30   No components found with this basename: POCBNP:3  Basename 09/19/11 2212  DDIMER 1.70*   No results found for this basename: HGBA1C:2 in the last 72 hours No results found for this basename: CHOL:2,HDL:2,LDLCALC:2,TRIG:2,CHOLHDL:2,LDLDIRECT:2 in the last 72 hours  Basename 09/20/11 0637  TSH 0.755  T4TOTAL --  T3FREE --  THYROIDAB --   No results found for this basename: VITAMINB12:2,FOLATE:2,FERRITIN:2,TIBC:2,IRON:2,RETICCTPCT:2 in the last 72 hours  Micro Results: Recent Results (from the past 240 hour(s))  MRSA PCR SCREENING     Status: Normal   Collection Time   09/20/11  3:12 AM      Component Value Range Status Comment   MRSA by PCR NEGATIVE  NEGATIVE Final     Studies/Results: Nm Pulmonary Per & Vent  09/20/2011  *RADIOLOGY REPORT*  Clinical Data: Back and chest pain, elevated D-dimer, cough, chronic renal disease.  NM PULMONARY  VENTILATION AND PERFUSION SCAN  Radiopharmaceutical: CURIE MAA TECHNETIUM TO 63M ALBUMIN AGGREGATED, CURIE xenon xe 133 gas 20 milli Curie XENON XE 133 GAS  Comparison: Chest radiograph 09/19/2011 showing cardiomegaly and mild vascular congestion, mild elevation of left diaphragmatic leaflet.  Findings:  Ventilation imaging   shows homogeneous distribution throughout both lungs with moderate bibasilar retention on the washout images.  Perfusion imaging   shows a   heterogeneous distribution throughout the lungs with no discrete segmental or subsegmental perfusion defects.  IMPRESSION 1.  Low likelihood ratio for  pulmonary embolism.  Original Report Authenticated By: Osa Craver, M.D.   Chest Portable 1 View  09/19/2011  *RADIOLOGY REPORT*  Clinical Data: Chest pain.  Back pain.  PORTABLE CHEST - 1 VIEW  Comparison: 08/18/2011.  Findings: Cardiomegaly and pulmonary vascular congestion is present.  Bilateral basilar atelectasis and / or scarring is present.  No areas of focal consolidation.  Tortuous thoracic aorta.  Patient rotated to the right.  Chronic right rotator cuff tear. Monitoring leads are projected over the chest.  IMPRESSION: Chronic changes of the chest.  Cardiomegaly and pulmonary vascular congestion.  Original Report Authenticated By: Andreas Newport, M.D.    Medications: I have reviewed the patient's current medications. Scheduled Meds:   . aspirin  324 mg Oral Once  . carvedilol  12.5 mg Oral BID WC  . feeding supplement  237 mL Oral BID BM  . hydrALAZINE  25 mg Oral BID  . isosorbide dinitrate  10 mg Oral BID  . levothyroxine  75 mcg Oral Q0600  . moxifloxacin  400 mg Oral Q2000  . pantoprazole  40 mg Oral Q1200  . potassium chloride  10 mEq Oral Daily  . sodium chloride  3 mL Intravenous Q12H  . sodium chloride  3 mL Intravenous Q12H  . DISCONTD: furosemide  40 mg Oral Daily  . DISCONTD: moxifloxacin  400 mg Intravenous Q24H   Continuous Infusions:  PRN Meds:.acetaminophen, acetaminophen, nitroGLYCERIN, ondansetron (ZOFRAN) IV, ondansetron, temazepam Assessment/Plan: Patient Active Hospital Problem List: Pulmonary HTN (09/15/2009)   Assessment: Patient is on chronic oxygen at home. We'll continue. She appears to be at baseline for her respiratory function    CONGESTIVE HEART FAILURE (09/15/2009)   Assessment: Patient has chronic systolic and diastolic heart failure. She appears to be clinically compensated however she does have a worsening of her renal function which is likely secondary to her Lasix. I will hold her Lasix for now and speak with her primary cardiologist  Dr. Elease Hashimoto about the trade-off between renal function and respiratory status.     Bronchitis (09/20/2011)   Assessment: Patient presently being treated for acute bronchitis and is on day #2 of antibiotics.     Acute on chronic renal failure (09/20/2011)   Assessment: I suspect this is secondary to her diuretics in the setting of pulmonary hypertension and congestive heart failure. As noted above I will speak with her cardiologist about happy medium for this patient with regard to her renal function versus respiratory function.       LOS: 2 days

## 2011-09-22 DIAGNOSIS — E039 Hypothyroidism, unspecified: Secondary | ICD-10-CM

## 2011-09-22 LAB — BASIC METABOLIC PANEL
BUN: 86 mg/dL — ABNORMAL HIGH (ref 6–23)
CO2: 32 mEq/L (ref 19–32)
Creatinine, Ser: 2.56 mg/dL — ABNORMAL HIGH (ref 0.50–1.10)
GFR calc Af Amer: 17 mL/min — ABNORMAL LOW (ref >90)
GFR calc non Af Amer: 15 mL/min — ABNORMAL LOW (ref >90)

## 2011-09-22 MED ORDER — SODIUM CHLORIDE 0.9 % IV SOLN
INTRAVENOUS | Status: DC
  Administered 2011-09-22 – 2011-09-23 (×2): via INTRAVENOUS

## 2011-09-22 NOTE — Progress Notes (Signed)
Physical Therapy Treatment Patient Details Name: [name] MRN: [id number] DOB: 12-22-1915 Today's Date: 09/22/2011 Time: 4098-1191 PT Time Calculation (min): 16 min  PT Assessment / Plan / Recommendation Comments on Treatment Session  Pt. continues to progress forward in her activity tolerance of ambulation. She remains motivated and daughter is supportive and attentive to her mom.  She should do well at home with family. Will benefit from HHPT follow up.    Follow Up Recommendations  Home health PT;Supervision/Assistance - 24 hour    Barriers to Discharge        Equipment Recommendations  None recommended by PT    Recommendations for Other Services    Frequency Min 3X/week   Plan Discharge plan remains appropriate    Precautions / Restrictions Precautions Precautions: Fall Restrictions Weight Bearing Restrictions: No   Pertinent Vitals/Pain No pain, no distress    Mobility  Bed Mobility Bed Mobility: Supine to Sit;Sitting - Scoot to Edge of Bed Supine to Sit: 5: Supervision;With rails Sitting - Scoot to Edge of Bed: 5: Supervision Details for Bed Mobility Assistance: pt. managing well with rails Transfers Transfers: Sit to Stand;Stand to Sit Sit to Stand: 4: Min assist;With upper extremity assist;From bed Stand to Sit: 4: Min guard;With upper extremity assist;To chair/3-in-1 Details for Transfer Assistance: pt. needs occasional postural cue as she has kyphotic posture.  Needs min to min guard assist for safety and stability Ambulation/Gait Ambulation/Gait Assistance: 4: Min guard Ambulation Distance (Feet): 150 Feet Assistive device: Rolling walker Ambulation/Gait Assistance Details: Pt. has been able to improve on her activity tolerance during gait with each session.  She is needing min to min guard assist for safety and stability, but manages the RW on her own power.   Gait Pattern: Step-through pattern;Decreased stride length Wheelchair Mobility Wheelchair  Mobility: No    Exercises     PT Diagnosis:    PT Problem List:   PT Treatment Interventions:     PT Goals Acute Rehab PT Goals PT Goal: Supine/Side to Sit - Progress: Progressing toward goal PT Goal: Sit to Stand - Progress: Progressing toward goal PT Goal: Stand to Sit - Progress: Progressing toward goal PT Goal: Ambulate - Progress: Progressing toward goal  Visit Information  Last PT Received On: 09/22/11 Assistance Needed: +1    Subjective Data  Subjective: No, I don't feel lazy today   Cognition  Overall Cognitive Status: Appears within functional limits for tasks assessed/performed Arousal/Alertness: Awake/alert Orientation Level: Oriented X4 / Intact Behavior During Session: [facility] for tasks performed    Balance     End of Session PT - End of Session Equipment Utilized During Treatment: Gait belt Activity Tolerance: Patient tolerated treatment well Patient left: in chair;with call bell/phone within reach;with family/visitor present Nurse Communication: Mobility status   GP     Ferman Hamming 09/22/2011, 11:46 AM Weldon Picking PT Acute Rehab Services [phone] [place] [phone]

## 2011-09-22 NOTE — ED Provider Notes (Signed)
Medical screening examination/treatment/procedure(s) were conducted as a shared visit with non-physician practitioner(s) and myself.  I personally evaluated the patient during the encounter   Alta Shober, MD 09/22/11 2245 

## 2011-09-22 NOTE — Progress Notes (Signed)
Utilization Review Completed.  

## 2011-09-22 NOTE — Progress Notes (Addendum)
Subjective: Patient states that she's feeling well and has no complaints.   Interval History: The patient's renal function continues to decrease with an increasing creatinine. Patient also has an increase in her BUN. She has a BUN/creatinine ratio of greater than 30.  Objective: Filed Vitals:   09/21/11 1416 09/21/11 2108 09/22/11 0622 09/22/11 1000  BP: 110/62 128/41 152/67 157/71  Pulse: 74 76 74   Temp: 98.5 F (36.9 C) 98.3 F (36.8 C) 97.6 F (36.4 C)   TempSrc: Oral Oral Oral   Resp: 16 16 16    Height:      Weight:   58.832 kg (129 lb 11.2 oz)   SpO2: 96% 100% 100%    Weight change: 0.771 kg (1 lb 11.2 oz)  Intake/Output Summary (Last 24 hours) at 09/22/11 1410 Last data filed at 09/22/11 1404  Gross per 24 hour  Intake    990 ml  Output    351 ml  Net    639 ml    General: Alert, awake, oriented x3, in no acute distress. Sitting up in chair today. HEENT: [place]/AT PEERL, EOMI Heart: Regular rate and rhythm, without murmurs, rubs, gallops.  Lungs: Clear to auscultation.  Abdomen: Soft, nontender, nondistended, positive bowel sounds.  Neuro: No focal neurological deficits noted.  Lab Results:  Basename 09/22/11 0500 09/21/11 0545  NA 140 141  K 4.8 4.0  CL 98 100  CO2 32 32  GLUCOSE 94 101*  BUN 86* 75*  CREATININE 2.56* 2.24*  CALCIUM 9.0 9.2  MG -- --  PHOS -- --    Basename 09/20/11 0637  AST 19  ALT 10  ALKPHOS 68  BILITOT 0.2*  PROT 7.1  ALBUMIN 3.2*   No results found for this basename: LIPASE:2,AMYLASE:2 in the last 72 hours  Basename 09/21/11 0545 09/20/11 0637  WBC 5.4 4.1  NEUTROABS -- 2.8  HGB 10.2* 10.7*  HCT 32.0* 34.3*  MCV 90.9 91.7  PLT 124* 132*    Basename 09/20/11 1853 09/20/11 0951 09/20/11 0637  CKTOTAL 57 56 55  CKMB 1.5 1.7 1.8  CKMBINDEX -- -- --  TROPONINI <0.30 <0.30 <0.30   No components found with this basename: POCBNP:3  Basename 09/19/11 2212  DDIMER 1.70*   No results found for this basename: HGBA1C:2 in  the last 72 hours No results found for this basename: CHOL:2,HDL:2,LDLCALC:2,TRIG:2,CHOLHDL:2,LDLDIRECT:2 in the last 72 hours  Basename 09/20/11 0637  TSH 0.755  T4TOTAL --  T3FREE --  THYROIDAB --   No results found for this basename: VITAMINB12:2,FOLATE:2,FERRITIN:2,TIBC:2,IRON:2,RETICCTPCT:2 in the last 72 hours  Micro Results: Recent Results (from the past 240 hour(s))  MRSA PCR SCREENING     Status: Normal   Collection Time   09/20/11  3:12 AM      Component Value Range Status Comment   MRSA by PCR NEGATIVE  NEGATIVE Final     Studies/Results: Nm Pulmonary Per & Vent  09/20/2011  *RADIOLOGY REPORT*  Clinical Data: Back and chest pain, elevated D-dimer, cough, chronic renal disease.  NM PULMONARY VENTILATION AND PERFUSION SCAN  Radiopharmaceutical: CURIE MAA TECHNETIUM TO 66M ALBUMIN AGGREGATED, CURIE xenon xe 133 gas 20 milli Curie XENON XE 133 GAS  Comparison: Chest radiograph 09/19/2011 showing cardiomegaly and mild vascular congestion, mild elevation of left diaphragmatic leaflet.  Findings:  Ventilation imaging   shows homogeneous distribution throughout both lungs with moderate bibasilar retention on the washout images.  Perfusion imaging   shows a   heterogeneous distribution throughout the lungs with  no discrete segmental or subsegmental perfusion defects.  IMPRESSION 1.  Low likelihood ratio for pulmonary embolism.  Original Report Authenticated By: Osa Craver, M.D.   Chest Portable 1 View  09/19/2011  *RADIOLOGY REPORT*  Clinical Data: Chest pain.  Back pain.  PORTABLE CHEST - 1 VIEW  Comparison: 08/18/2011.  Findings: Cardiomegaly and pulmonary vascular congestion is present.  Bilateral basilar atelectasis and / or scarring is present.  No areas of focal consolidation.  Tortuous thoracic aorta.  Patient rotated to the right.  Chronic right rotator cuff tear. Monitoring leads are projected over the chest.  IMPRESSION: Chronic changes of the chest.   Cardiomegaly and pulmonary vascular congestion.  Original Report Authenticated By: Andreas Newport, M.D.    Medications: I have reviewed the patient's current medications. Scheduled Meds:    . aspirin  324 mg Oral Once  . carvedilol  12.5 mg Oral BID WC  . feeding supplement  237 mL Oral BID BM  . hydrALAZINE  25 mg Oral BID  . isosorbide dinitrate  10 mg Oral BID  . levothyroxine  75 mcg Oral Q0600  . moxifloxacin  400 mg Oral Q2000  . pantoprazole  40 mg Oral Q1200  . potassium chloride  10 mEq Oral Daily  . sodium chloride  3 mL Intravenous Q12H  . sodium chloride  3 mL Intravenous Q12H  . DISCONTD: furosemide  40 mg Oral Daily   Continuous Infusions:    . sodium chloride     PRN Meds:.acetaminophen, acetaminophen, nitroGLYCERIN, ondansetron (ZOFRAN) IV, ondansetron, temazepam Assessment/Plan: Patient Active Hospital Problem List: Acute on chronic renal failure (09/20/2011)   Assessment: I suspect this is secondary to her diuretics in the setting of pulmonary hypertension and congestive heart failure. I will continue to hold her diuretics and add gentle hydration at this point. I reviewed the patient's medications and cannot see any offending agents that would contribute to her worsening renal function. If the patient has no appreciable improvement despite fluids I will obtain a renal consult.  Bronchitis (09/20/2011)   Assessment: Patient presently being treated for acute bronchitis and is on day #3 of antibiotics. Continue antibiotics   Pulmonary HTN (09/15/2009)   Assessment: Patient is on chronic oxygen at home. We'll continue. She appears to be at baseline for her respiratory function    CONGESTIVE HEART FAILURE (09/15/2009)   Assessment: Patient has chronic systolic and diastolic heart failure. She appears to be clinically compensated however she does have a worsening of her renal function which is likely secondary to her Lasix.  Patient's weight today is 58.8 kg which is well  below her dry weight of 62.95 kg which is noted at her cardiologist's office on 08/24/2011.           LOS: 3 days

## 2011-09-22 NOTE — Progress Notes (Signed)
Pt's arms assessed for IV stick unable to find any.  IV team paged.  Amanda Pea, Charity fundraiser.

## 2011-09-23 LAB — CBC WITH DIFFERENTIAL/PLATELET
Basophils Absolute: 0 10*3/uL (ref 0.0–0.1)
Eosinophils Relative: 6 % — ABNORMAL HIGH (ref 0–5)
HCT: 30.4 % — ABNORMAL LOW (ref 36.0–46.0)
Hemoglobin: 9.7 g/dL — ABNORMAL LOW (ref 12.0–15.0)
Lymphocytes Relative: 23 % (ref 12–46)
Lymphs Abs: 1 10*3/uL (ref 0.7–4.0)
MCV: 91.3 fL (ref 78.0–100.0)
Monocytes Absolute: 0.4 10*3/uL (ref 0.1–1.0)
Monocytes Relative: 9 % (ref 3–12)
Neutro Abs: 2.7 10*3/uL (ref 1.7–7.7)
RBC: 3.33 MIL/uL — ABNORMAL LOW (ref 3.87–5.11)
WBC: 4.3 10*3/uL (ref 4.0–10.5)

## 2011-09-23 LAB — BASIC METABOLIC PANEL
CO2: 29 mEq/L (ref 19–32)
Calcium: 8.8 mg/dL (ref 8.4–10.5)
Chloride: 103 mEq/L (ref 96–112)
Glucose, Bld: 95 mg/dL (ref 70–99)
Sodium: 141 mEq/L (ref 135–145)

## 2011-09-23 LAB — MAGNESIUM: Magnesium: 2.4 mg/dL (ref 1.5–2.5)

## 2011-09-23 NOTE — Evaluation (Signed)
Occupational Therapy Evaluation and Discharge Patient Details Name: [name] MRN: [id number] DOB: 1915/03/14 Today's Date: 09/23/2011 Time: 5784-6962 OT Time Calculation (min): 26 min  OT Assessment / Plan / Recommendation Clinical Impression  Pt admitted with bronchitis and chest pain. MD in room and states pt may d/c tomorrow therefore will not continue to see pt acutely. However, recommend HHOT for d/c plan.     OT Assessment  All further OT needs can be met in the next venue of care    Follow Up Recommendations  Home health OT;Supervision/Assistance - 24 hour (initially)    Barriers to Discharge      Equipment Recommendations  None recommended by PT;None recommended by OT    Recommendations for Other Services    Frequency       Precautions / Restrictions Precautions Precautions: Fall Restrictions Weight Bearing Restrictions: No   Pertinent Vitals/Pain Pt denies any pain during eval    ADL  Grooming: Performed;Min guard Where Assessed - Grooming: Unsupported standing Upper Body Bathing: Simulated;Supervision/safety;Set up Where Assessed - Upper Body Bathing: Unsupported sitting Lower Body Bathing: Simulated;Min guard Where Assessed - Lower Body Bathing: Supported sit to stand Lower Body Dressing: Performed;Supervision/safety (don/doff socks) Where Assessed - Lower Body Dressing: Unsupported sitting Toilet Transfer: Simulated;Min guard Toilet Transfer Method: Sit to Barista:  (from bed) Toileting - Architect and Hygiene: Simulated;Min guard Where Assessed - Glass blower/designer Manipulation and Hygiene: Standing Equipment Used: Gait belt;Rolling walker Transfers/Ambulation Related to ADLs: Min guard A with RW around tight spaces in room    OT Diagnosis: Generalized weakness  OT Problem List: Decreased activity tolerance;Impaired balance (sitting and/or standing) OT Treatment Interventions:     OT Goals    Visit  Information  Last OT Received On: 09/23/11 Assistance Needed: +1    Subjective Data  Subjective: I'm ready to go home Patient Stated Goal: Return home with family   Prior Functioning  Vision/Perception  Home Living Lives With: Alone Available Help at Discharge: Family (can be 24/7 if needed) Type of Home: House Home Access: Ramped entrance Home Layout: One level Bathroom Shower/Tub: Health visitor: Standard Bathroom Accessibility: Yes How Accessible: Accessible via walker Home Adaptive Equipment: Bedside commode/3-in-1;Hand-held shower hose;Walker - rolling;Shower chair with back;Wheelchair - manual;Grab bars in shower Prior Function Level of Independence: Needs assistance Needs Assistance: Light Housekeeping;Gait Light Housekeeping: Maximal Gait Assistance: uses RW.   Able to Take Stairs?: No Driving: No Vocation: Retired Musician: No difficulties Dominant Hand: Right      Cognition  Overall Cognitive Status: Appears within functional limits for tasks assessed/performed Arousal/Alertness: Awake/alert Orientation Level: Oriented X4 / Intact Behavior During Session: [facility] for tasks performed    Extremity/Trunk Assessment Right Upper Extremity Assessment RUE ROM/Strength/Tone: WFL for tasks assessed (for age) RUE Sensation: WFL - Light Touch RUE Coordination: WFL - gross/fine motor Left Upper Extremity Assessment LUE ROM/Strength/Tone: WFL for tasks assessed (for age) LUE Sensation: WFL - Light Touch LUE Coordination: WFL - gross/fine motor   Mobility Bed Mobility Bed Mobility: Supine to Sit;Sitting - Scoot to Edge of Bed Supine to Sit: 6: Modified independent (Device/Increase time) Sitting - Scoot to Edge of Bed: 6: Modified independent (Device/Increase time) Sit to Supine: Not Tested (comment) Details for Bed Mobility Assistance: pt. managing well with rails Transfers Sit to Stand: 4: Min guard;With upper extremity assist;From  bed Stand to Sit: 5: Supervision;With armrests;To chair/3-in-1 Details for Transfer Assistance: safe technique and appropriate hand plcaement   Exercise  Balance    End of Session OT - End of Session Equipment Utilized During Treatment: Gait belt Activity Tolerance: Patient tolerated treatment well Patient left: in chair;with call bell/phone within reach;with family/visitor present  GO     Krimson Massmann 09/23/2011, 3:43 PM

## 2011-09-23 NOTE — Progress Notes (Signed)
Subjective: Patient states that she's feeling well and has no complaints.   Interval History: The patient's renal function has improved with fluids and holding diuretics.  Objective: Filed Vitals:   09/22/11 1538 09/22/11 2131 09/23/11 0540 09/23/11 1434  BP: 159/78 141/55 169/50 161/63  Pulse: 76 74 68 77  Temp: 96.9 F (36.1 C) 98.1 F (36.7 C) 97.5 F (36.4 C) 97.4 F (36.3 C)  TempSrc: Oral Oral Oral Oral  Resp: 18 18 16 18   Height:      Weight:   60.374 kg (133 lb 1.6 oz)   SpO2: 100% 100% 98% 100%   Weight change: 1.542 kg (3 lb 6.4 oz)  Intake/Output Summary (Last 24 hours) at 09/23/11 1817 Last data filed at 09/23/11 1500  Gross per 24 hour  Intake 1883.75 ml  Output    900 ml  Net 983.75 ml    General: Alert, awake, oriented x3, in no acute distress. Sitting up in chair today. HEENT: [place]/AT PEERL, EOMI Heart: Regular rate and rhythm, without murmurs, rubs, gallops.  Lungs: Clear to auscultation. No rales or ronchi noted Abdomen: Soft, nontender, nondistended, positive bowel sounds.  Neuro: No focal neurological deficits noted.  Lab Results:  Basename 09/23/11 0638 09/22/11 0500  NA 141 140  K 4.7 4.8  CL 103 98  CO2 29 32  GLUCOSE 95 94  BUN 75* 86*  CREATININE 1.80* 2.56*  CALCIUM 8.8 9.0  MG 2.4 --  PHOS -- --   No results found for this basename: AST:2,ALT:2,ALKPHOS:2,BILITOT:2,PROT:2,ALBUMIN:2 in the last 72 hours No results found for this basename: LIPASE:2,AMYLASE:2 in the last 72 hours  Basename 09/23/11 0638 09/21/11 0545  WBC 4.3 5.4  NEUTROABS 2.7 --  HGB 9.7* 10.2*  HCT 30.4* 32.0*  MCV 91.3 90.9  PLT 124* 124*    Basename 09/20/11 1853  CKTOTAL 57  CKMB 1.5  CKMBINDEX --  TROPONINI <0.30   No components found with this basename: POCBNP:3 No results found for this basename: DDIMER:2 in the last 72 hours No results found for this basename: HGBA1C:2 in the last 72 hours No results found for this basename:  CHOL:2,HDL:2,LDLCALC:2,TRIG:2,CHOLHDL:2,LDLDIRECT:2 in the last 72 hours No results found for this basename: TSH,T4TOTAL,FREET3,T3FREE,THYROIDAB in the last 72 hours No results found for this basename: VITAMINB12:2,FOLATE:2,FERRITIN:2,TIBC:2,IRON:2,RETICCTPCT:2 in the last 72 hours  Micro Results: Recent Results (from the past 240 hour(s))  MRSA PCR SCREENING     Status: Normal   Collection Time   09/20/11  3:12 AM      Component Value Range Status Comment   MRSA by PCR NEGATIVE  NEGATIVE Final     Studies/Results: Nm Pulmonary Per & Vent  09/20/2011  *RADIOLOGY REPORT*  Clinical Data: Back and chest pain, elevated D-dimer, cough, chronic renal disease.  NM PULMONARY VENTILATION AND PERFUSION SCAN  Radiopharmaceutical: CURIE MAA TECHNETIUM TO 44M ALBUMIN AGGREGATED, CURIE xenon xe 133 gas 20 milli Curie XENON XE 133 GAS  Comparison: Chest radiograph 09/19/2011 showing cardiomegaly and mild vascular congestion, mild elevation of left diaphragmatic leaflet.  Findings:  Ventilation imaging   shows homogeneous distribution throughout both lungs with moderate bibasilar retention on the washout images.  Perfusion imaging   shows a   heterogeneous distribution throughout the lungs with no discrete segmental or subsegmental perfusion defects.  IMPRESSION 1.  Low likelihood ratio for pulmonary embolism.  Original Report Authenticated By: Osa Craver, M.D.   Chest Portable 1 View  09/19/2011  *RADIOLOGY REPORT*  Clinical Data: Chest pain.  Back pain.  PORTABLE CHEST - 1 VIEW  Comparison: 08/18/2011.  Findings: Cardiomegaly and pulmonary vascular congestion is present.  Bilateral basilar atelectasis and / or scarring is present.  No areas of focal consolidation.  Tortuous thoracic aorta.  Patient rotated to the right.  Chronic right rotator cuff tear. Monitoring leads are projected over the chest.  IMPRESSION: Chronic changes of the chest.  Cardiomegaly and pulmonary vascular congestion.   Original Report Authenticated By: Andreas Newport, M.D.    Medications: I have reviewed the patient's current medications. Scheduled Meds:    . aspirin  324 mg Oral Once  . carvedilol  12.5 mg Oral BID WC  . feeding supplement  237 mL Oral BID BM  . hydrALAZINE  25 mg Oral BID  . isosorbide dinitrate  10 mg Oral BID  . levothyroxine  75 mcg Oral Q0600  . moxifloxacin  400 mg Oral Q2000  . pantoprazole  40 mg Oral Q1200  . potassium chloride  10 mEq Oral Daily  . sodium chloride  3 mL Intravenous Q12H  . sodium chloride  3 mL Intravenous Q12H   Continuous Infusions:    . DISCONTD: sodium chloride 75 mL/hr at 09/23/11 0634   PRN Meds:.acetaminophen, acetaminophen, nitroGLYCERIN, ondansetron (ZOFRAN) IV, ondansetron, temazepam Assessment/Plan: Patient Active Hospital Problem List: Acute on chronic renal failure (09/20/2011)   Assessment: Renal function has improved with fluids. Will discontinue fluids and re-evaluate renal function tomorrow.  Bronchitis (09/20/2011)   Assessment: Patient presently being treated for acute bronchitis and is on day #4 of antibiotics. Continue antibiotics   Pulmonary HTN (09/15/2009)   Assessment: Patient is on chronic oxygen at home. We'll continue. She appears to be at baseline for her respiratory function    CONGESTIVE HEART FAILURE (09/15/2009)   Assessment: Patient has chronic systolic and diastolic heart failure. She appears to be clinically compensated however she does have a worsening of her renal function which is likely secondary to her Lasix.  Patient's weight today is 60.374 kg which is closer toher dry weight of 62.95 kg which is noted at her cardiologist's office on 08/24/2011.           LOS: 4 days

## 2011-09-23 NOTE — Progress Notes (Signed)
Physical Therapy Treatment Patient Details Name: MODESTY RUDY MRN: [id number] DOB: 1915-12-15 Today's Date: 09/23/2011 Time: 4098-1191 PT Time Calculation (min): 22 min  PT Assessment / Plan / Recommendation Comments on Treatment Session  Sats maintained in the mid 90's on room air during walk.  Progressing her tolerance of activity and her distance.    Follow Up Recommendations  Home health PT;Supervision/Assistance - 24 hour    Barriers to Discharge        Equipment Recommendations  None recommended by PT    Recommendations for Other Services    Frequency Min 3X/week   Plan Discharge plan remains appropriate    Precautions / Restrictions Precautions Precautions: Fall Restrictions Weight Bearing Restrictions: No   Pertinent Vitals/Pain sats remained in the mid 90's throughout her walk, no distress; sounds somewhat more congested today.    Mobility  Bed Mobility Bed Mobility: Supine to Sit;Sitting - Scoot to Edge of Bed Supine to Sit: 6: Modified independent (Device/Increase time) Sitting - Scoot to Edge of Bed: 6: Modified independent (Device/Increase time) Sit to Supine: Not Tested (comment) Details for Bed Mobility Assistance: pt. managing well with rails Transfers Transfers: Sit to Stand;Stand to Sit Sit to Stand: 4: Min guard;With upper extremity assist;From bed Stand to Sit: 5: Supervision;With upper extremity assist;To bed Details for Transfer Assistance: safe technique and apprporiate hand plcaement Ambulation/Gait Ambulation/Gait Assistance: 4: Min guard Ambulation Distance (Feet): 175 Feet Assistive device: Rolling walker Ambulation/Gait Assistance Details: vc's for posture, min guard assist for safety Gait Pattern: Step-through pattern;Decreased stride length Gait velocity: near normal General Gait Details: keeps head flexed forward Stairs: No Wheelchair Mobility Wheelchair Mobility: No    Exercises     PT Diagnosis:    PT Problem List:   PT  Treatment Interventions:     PT Goals Acute Rehab PT Goals PT Goal: Supine/Side to Sit - Progress: Met PT Goal: Sit to Supine/Side - Progress: Progressing toward goal PT Goal: Sit to Stand - Progress: Progressing toward goal PT Goal: Stand to Sit - Progress: Progressing toward goal PT Goal: Ambulate - Progress: Progressing toward goal  Visit Information  Last PT Received On: 09/23/11 Assistance Needed: +1    Subjective Data  Subjective: I didn't think you were coming today   Cognition  Overall Cognitive Status: Appears within functional limits for tasks assessed/performed Arousal/Alertness: Awake/alert Orientation Level: Oriented X4 / Intact Behavior During Session: [facility] for tasks performed    Balance     End of Session PT - End of Session Equipment Utilized During Treatment: Gait belt Activity Tolerance: Patient tolerated treatment well Patient left: in bed;with call bell/phone within reach;with family/visitor present;Other (comment) (Dr. Jerolyn Center in room) Nurse Communication: Mobility status   GP     Ferman Hamming 09/23/2011, 3:27 PM Weldon Picking PT Acute Rehab Services [phone] [place] [phone]

## 2011-09-24 DIAGNOSIS — R269 Unspecified abnormalities of gait and mobility: Secondary | ICD-10-CM | POA: Diagnosis present

## 2011-09-24 LAB — BASIC METABOLIC PANEL
CO2: 26 mEq/L (ref 19–32)
Calcium: 8.8 mg/dL (ref 8.4–10.5)
Chloride: 106 mEq/L (ref 96–112)
Creatinine, Ser: 1.32 mg/dL — ABNORMAL HIGH (ref 0.50–1.10)
Glucose, Bld: 113 mg/dL — ABNORMAL HIGH (ref 70–99)

## 2011-09-24 MED ORDER — MOXIFLOXACIN HCL 400 MG PO TABS: 400.0000 mg | ORAL_TABLET | Freq: Every day | ORAL | Status: AC

## 2011-09-24 MED ORDER — FUROSEMIDE 20 MG PO TABS: 20.0000 mg | ORAL_TABLET | Freq: Two times a day (BID) | ORAL | Status: DC

## 2011-09-24 MED ORDER — ASPIRIN 81 MG PO CHEW: 324.0000 mg | CHEWABLE_TABLET | Freq: Once | ORAL | Status: DC

## 2011-09-24 MED ORDER — ENSURE COMPLETE PO LIQD: 237.0000 mL | Freq: Two times a day (BID) | ORAL | Status: DC

## 2011-09-24 NOTE — Care Management Note (Signed)
    Page 1 of 1   09/24/2011     3:19:20 PM   CARE MANAGEMENT NOTE 09/24/2011  Patient:  [name], [name]   Account Number:  [id number]  Date Initiated:  09/23/2011  Documentation initiated by:  Tera Mater  Subjective/Objective Assessment:   78yo female admitted with Chest pain.  Pt. lives at home with daughter.     Action/Plan:   Discharge planning for [facility] services   Anticipated DC Date:  09/24/2011   Anticipated DC Plan:  HOME W HOME HEALTH SERVICES      DC Planning Services  CM consult      PAC Choice  DURABLE MEDICAL EQUIPMENT  HOME HEALTH   Choice offered to / List presented to:  C-1 Patient        HH arranged  HH - 11 Patient Refused      Status of service:  Completed, signed off Medicare Important Message given?   (If response is "NO", the following Medicare IM given date fields will be blank) Date Medicare IM given:   Date Additional Medicare IM given:    Discharge Disposition:  HOME/SELF CARE  Per UR Regulation:  Reviewed for med. necessity/level of care/duration of stay  If discussed at Long Length of Stay Meetings, dates discussed:    Comments:  09/24/11 1515 Tera Mater, RN, BSN NCM [phone] In to speak with pt. about [facility] services and DME.  Pt. did not want HH services at this time and she stated she had 2 rolling walkers at home. Pt. to discharge home with dtr. today.   09/23/11 1149 Tera Mater, RN, BSN NCM [phone] Creatinine is elevated.  Currently on po diuretics. Anticipate discharge tomorrow with [facility]

## 2011-09-24 NOTE — Discharge Summary (Signed)
RHODESIA STANGER MRN: [id number] DOB/AGE: Aug 25, 1915 78 y.o.  Admit date: 09/19/2011 Discharge date: 09/24/2011  Primary Care Physician:  [name], MD   Discharge Diagnoses:   Patient Active Problem List  Diagnosis  . CARCINOMA  . HYPOTHYROIDISM  . HYPERTENSION  . CORONARY ARTERY DISEASE  . ABNORMAL HEART RHYTHMS  . CONGESTIVE HEART FAILURE  . CVA-STROKE  . ALLERGIC RHINITIS  . ESOPHAGEAL STRICTURE  . GERD  . DIVERTICULOSIS-COLON  . DYSPHAGIA  . CEREBROVASCULAR ACCIDENT, HX OF  . Chest pain  . Bronchitis  . Acute on chronic renal failure  . Acute renal failure  . Gait abnormality    DISCHARGE MEDICATION: Medication List  As of 09/24/2011  2:48 PM   TAKE these medications         aspirin 81 MG chewable tablet   Chew 4 tablets (324 mg total) by mouth once.      calcium carbonate 500 MG chewable tablet   Commonly known as: TUMS - dosed in mg elemental calcium   Chew 1 tablet by mouth 2 (two) times daily.      carvedilol 6.25 MG tablet   Commonly known as: COREG   Take 12.5 mg by mouth 2 (two) times daily with a meal.      feeding supplement Liqd   Take 237 mLs by mouth 2 (two) times daily between meals.      furosemide 20 MG tablet   Commonly known as: LASIX   Take 1/2 tablet (10 mg total) by mouth daily.      hydrALAZINE 25 MG tablet   Commonly known as: APRESOLINE   Take 25 mg by mouth 2 (two) times daily.      isosorbide dinitrate 10 MG tablet   Commonly known as: ISORDIL   Take 10 mg by mouth 2 (two) times daily.      levothyroxine 75 MCG tablet   Commonly known as: SYNTHROID, LEVOTHROID   Take 75 mcg by mouth daily.      Melatonin 3 MG Tabs   Take 6 mg by mouth at bedtime.      moxifloxacin 400 MG tablet   Commonly known as: AVELOX   Take 1 tablet (400 mg total) by mouth daily at 8 pm.      oxymetazoline 0.05 % nasal spray   Commonly known as: AFRIN   Place 2 sprays into the nose at bedtime.      pantoprazole 40 MG tablet   Commonly  known as: PROTONIX   Take 40 mg by mouth daily.      potassium chloride 10 MEQ tablet   Commonly known as: K-DUR,KLOR-CON   Take 1 tablet (10 mEq total) by mouth daily.      temazepam 30 MG capsule   Commonly known as: RESTORIL   Take 30 mg by mouth at bedtime.      TYLENOL 500 MG tablet   Generic drug: acetaminophen   Take 500 mg by mouth as needed. Pain/fever              Consults:     SIGNIFICANT DIAGNOSTIC STUDIES:  Nm Pulmonary Per & Vent  09/20/2011  *RADIOLOGY REPORT*  Clinical Data: Back and chest pain, elevated D-dimer, cough, chronic renal disease.  NM PULMONARY VENTILATION AND PERFUSION SCAN  Radiopharmaceutical: CURIE MAA TECHNETIUM TO 52M ALBUMIN AGGREGATED, CURIE xenon xe 133 gas 20 milli Curie XENON XE 133 GAS  Comparison: Chest radiograph 09/19/2011 showing cardiomegaly and mild vascular congestion, mild elevation of  left diaphragmatic leaflet.  Findings:  Ventilation imaging   shows homogeneous distribution throughout both lungs with moderate bibasilar retention on the washout images.  Perfusion imaging   shows a   heterogeneous distribution throughout the lungs with no discrete segmental or subsegmental perfusion defects.  IMPRESSION 1.  Low likelihood ratio for pulmonary embolism.  Original Report Authenticated By: Osa Craver, M.D.   Chest Portable 1 View  09/19/2011  *RADIOLOGY REPORT*  Clinical Data: Chest pain.  Back pain.  PORTABLE CHEST - 1 VIEW  Comparison: 08/18/2011.  Findings: Cardiomegaly and pulmonary vascular congestion is present.  Bilateral basilar atelectasis and / or scarring is present.  No areas of focal consolidation.  Tortuous thoracic aorta.  Patient rotated to the right.  Chronic right rotator cuff tear. Monitoring leads are projected over the chest.  IMPRESSION: Chronic changes of the chest.  Cardiomegaly and pulmonary vascular congestion.  Original Report Authenticated By: Andreas Newport, M.D.      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 The pain happens suddenly and lasts a few seconds. It is stabbing type increased on deep inspiration. Patient also noticed increased productive cough over the last few days. Denies any fever chills or nausea vomiting or abdominal pain. Her pain became more increasing in duration and decided to come to the ER. EKG and cardiac enzymes are negative. Patient is afebrile. Chest x-ray shows congestion. D-dimer is mildly elevated. Patient has been admitted for further workup including getting a VQ scan given the patient has increased creatinine and unable to do CT chest angiogram protocol. Presently chest pain-free.    Hospital Course:  Present on Admission:  .Chest pain: Was likely related to pneumonia as it resolved as clinical course improved. Her cardiac enzymes were negative and there were no acute events noted on EKG or telemetry.  .Acute on chronic renal failure: On admission patient had a Cr of 1.91 as compared to a baseline Cr of 1.4 on 08/24/2011. Pt reported that her lasix had been doubled by Dr. Melburn Popper at the last office in the interim.  With diuresis Cr. Peaked at 2.24. Diuretics were held and renal function recovered. Pt was discharged on a reduced dose of Lasix - 10 mg daily with parameters to increase dose to 20 mg if she has a 3# weight gain in 2 days or 5# weight gain in 7 days. Recommend checking renal function in one week.  .Bronchitis: Pt was  treated for a bronchitis with Avelox for a total of 7 days.  .CONGESTIVE HEART FAILURE: Pt has chronic systolic and diastolic HF. However she was clinically compensated and at her baseline of respiratory function.  .Pulmonary Hypertension: Pt is chronically on oxygen and was continued on her usual dose.  .Gait abnormality: pt was evaluated by PT who recommended HH PT and OT.  Marland KitchenHYPOTHYROIDISM: presently euthyroid with a TSH of 0.755. She is continued on her pre-hospital dose of Synthroid.  Disposition and Follow-up:  Pt to follow up with her PMD Dr. Allyne Gee in 2 weeks.  Discharge Orders    Future Appointments: Provider: Department: Dept Phone: Center:   11/29/2011 2:45 PM [facility] [facility] [phone] [facility]   11/29/2011 3:00 PM Vesta Mixer, MD [facility] [phone] None     Future Orders Please Complete By Expires   Diet - low sodium heart healthy      Walk with assistance      Walker       Activity as tolerated - No restrictions      Home Health      Questions: Responses:   To provide the following care/treatments PT    OT    RN    Home Health Aide   Face-to-face encounter      Comments:   I Donia Yokum A. certify that this patient is under my care and that I, or a nurse practitioner or physician's assistant working with me, had a face-to-face encounter that meets the physician face-to-face encounter requirements with this patient on 09/24/2011.   Questions: Responses:   The encounter with the patient was in whole, or in part, for the following medical condition, which is the primary reason for home health care Acute renal failure, CHF   I certify that, based on my findings, the following services are medically necessary home health services Nursing    Physical therapy   My clinical findings support the need for the above services Complex treatment plan/patient with lack knowledge disease process and treatment   Further, I certify that  my clinical findings support that this patient is homebound due to: Unsafe ambulation due to balance issues    Shortness of Breath with activity   To provide the following care/treatments PT    OT    RN    Home Health Aide      DISCHARGE EXAM: General: Alert, awake, oriented x3, in no acute distress. Sitting up in chair today.  Vital Signs:Blood pressure 155/58, pulse 73, temperature 97.9 F (36.6 C), temperature source Oral, resp. rate 20, height 5' (1.524 m), weight 60.7 kg (133 lb 13.1 oz), SpO2 100.00%. HEENT: [place]/AT PEERL, EOMI  Heart: Regular rate and rhythm, without murmurs, rubs, gallops.  Lungs: Clear to auscultation. No rales or ronchi noted  Abdomen: Soft, nontender, nondistended, positive bowel sounds.  Neuro: No focal neurological deficits noted.    Basename 09/24/11 0821 09/23/11 0638  NA 140 141  K 4.2 4.7  CL 106 103  CO2 26 29  GLUCOSE 113* 95  BUN 50* 75*  CREATININE 1.32* 1.80*  CALCIUM 8.8 8.8  MG -- 2.4  PHOS -- --   No results found for this basename: AST:2,ALT:2,ALKPHOS:2,BILITOT:2,PROT:2,ALBUMIN:2 in the last 72 hours No results found for this basename: LIPASE:2,AMYLASE:2 in the last 72 hours  Basename 09/23/11 0638  WBC 4.3  NEUTROABS 2.7  HGB 9.7*  HCT 30.4*  MCV 91.3  PLT 124*   Total time for discharge process including face to face time greater than thirty minutes Signed: Teal Bontrager A. 09/24/2011, 2:48 PM

## 2011-11-23 ENCOUNTER — Ambulatory Visit: Payer: Medicare Other | Admitting: Cardiovascular Disease

## 2011-11-23 ENCOUNTER — Other Ambulatory Visit: Payer: Medicare Other

## 2011-11-29 ENCOUNTER — Other Ambulatory Visit (INDEPENDENT_AMBULATORY_CARE_PROVIDER_SITE_OTHER): Payer: Medicare Other

## 2011-11-29 ENCOUNTER — Encounter: Payer: Self-pay | Admitting: Cardiovascular Disease

## 2011-11-29 ENCOUNTER — Ambulatory Visit (INDEPENDENT_AMBULATORY_CARE_PROVIDER_SITE_OTHER): Payer: Medicare Other | Admitting: Cardiovascular Disease

## 2011-11-29 VITALS — BP 158/90 | HR 92 | Ht 60.0 in | Wt 137.8 lb

## 2011-11-29 DIAGNOSIS — I509 Heart failure, unspecified: Secondary | ICD-10-CM

## 2011-11-29 DIAGNOSIS — I25810 Atherosclerosis of coronary artery bypass graft(s) without angina pectoris: Secondary | ICD-10-CM

## 2011-11-29 DIAGNOSIS — R0989 Other specified symptoms and signs involving the circulatory and respiratory systems: Secondary | ICD-10-CM

## 2011-11-29 LAB — BASIC METABOLIC PANEL
BUN: 24 mg/dL — ABNORMAL HIGH (ref 6–23)
CO2: 22 mEq/L (ref 19–32)
Calcium: 9.4 mg/dL (ref 8.4–10.5)
Glucose, Bld: 89 mg/dL (ref 70–99)
Sodium: 140 mEq/L (ref 135–145)

## 2011-11-29 NOTE — Assessment & Plan Note (Signed)
[name] seems to be doing well. She does not have any signs or symptoms of congestive heart failure today. She is 78 years old and seems to be doing fairly well given her age.  We'll continue with her same medications.

## 2011-11-29 NOTE — Patient Instructions (Addendum)
Your physician wants you to follow-up in: 6 months  You will receive a reminder letter in the mail two months in advance. If you don't receive a letter, please call our office to schedule the follow-up appointment.  Your physician recommends that you return for lab work in: bmet today and in 6 months

## 2011-11-29 NOTE — Progress Notes (Signed)
[name] Date of Birth  1915-06-27       [facility]    [place] [address], [address]  [address] [place], [place]  [address]   [place], [place]  [address] [phone]     [phone]   Fax  [phone]    Fax [phone]  Problem List: 1. Hypertension 2. CHF 3. Hypothyroidism 4. Aortic insufficiency 5. Pulmonary Hypertension  History of Present Illness:  [name] is a 78 year old female with a history of congestive heart failure, aortic stenosis, hypothyroidism, and hypertension. She presents today after being diagnosed with mild congestive heart failure after she went to her general medical doctors office.  She has been eating some of cream of  chicken soup. She has problems with her esophagus and has bleeding of her esophagus that she eats food that is too rough. She finds that the chicken soup goes on fairly well. She's also had some mild shortness of breath recently. She also describes some back pain and some left arm discomfort.  She was hospitalized in August with dyspnea.  She had some renal insufficiency and her Lasix dose was lowered.  Her daughter is giving her Lasix 10 mg every other day.  Current Outpatient Prescriptions on File Prior to Visit  Medication Sig Dispense Refill  . acetaminophen (TYLENOL) 500 MG tablet Take 500 mg by mouth as needed. Pain/fever      . calcium carbonate (TUMS - DOSED IN MG ELEMENTAL CALCIUM) 500 MG chewable tablet Chew 1 tablet by mouth 2 (two) times daily.       . carvedilol (COREG) 6.25 MG tablet Take 12.5 mg by mouth 2 (two) times daily with a meal.       . feeding supplement (ENSURE COMPLETE) LIQD Take 237 mLs by mouth 2 (two) times daily between meals.      . isosorbide dinitrate (ISORDIL) 10 MG tablet Take 10 mg by mouth 2 (two) times daily.       Marland Kitchen levothyroxine (SYNTHROID, LEVOTHROID) 75 MCG tablet Take 75 mcg by mouth daily.        . Melatonin 3 MG TABS Take 6 mg by mouth at  bedtime.       Marland Kitchen oxymetazoline (AFRIN) 0.05 % nasal spray Place 2 sprays into the nose as needed.       . pantoprazole (PROTONIX) 40 MG tablet Take 40 mg by mouth daily.      . potassium chloride (K-DUR,KLOR-CON) 10 MEQ tablet Take 1 tablet (10 mEq total) by mouth daily.  30 tablet  5  . temazepam (RESTORIL) 30 MG capsule Take 30 mg by mouth at bedtime.        Marland Kitchen DISCONTD: furosemide (LASIX) 20 MG tablet Take 1 tablet (20 mg total) by mouth 2 (two) times daily.  30 tablet  0    Allergies  Allergen Reactions  . Enoxaparin Sodium     Unknown  . Hydrocodone-Acetaminophen     syncope  . Oxycodone-Acetaminophen     Unknown    Past Medical History  Diagnosis Date  . Hyperlipidemia   . Hypertension   . Chronic kidney disease   . Hypothyroidism   . Cardiomegaly   . CHF (congestive heart failure)     Recent hospitalization for CHF & pneumonia -- EF is 40-45%  ( 03/16/10 by Dr. Peter [place] )    . Shortness of breath   . Dyspnea   . Stroke     history  of   . History of rheumatic fever   . Head and neck cancer     status post radiation therapy  . Coronary artery disease     EF of 40-45% with history of angioplasty  . Pulmonary hypertension     with PA pressure estimated at 53 mmHg  . Pulmonary edema   . Hypoxia   . Aortic insufficiency     moderate-severe  . Arthritis   . Pneumonia     hx of  . Anemia     Past Surgical History  Procedure Date  . Tonsillectomy   . Thyroidectomy, partial   . Hammer toe surgery   . Refractive surgery     both eyes  . Coronary angioplasty     with angioplasty  . Joint replacement     bilat  . Knee surgery     History  Smoking status  . Never Smoker   Smokeless tobacco  . Never Used    History  Alcohol Use No    Family History  Problem Relation Age of Onset  . Stroke Father   . Ulcers Father   . Asthma Mother   . Cancer Brother   . Cancer Brother   . Cancer Brother   . Dementia Sister   . Congenital heart disease Sister    . Hypertension Son   . Hypertension Son   . Hypertension Son   . Hypertension Son   . Hypertension Son   . Hypertension Son   . Hypertension Son   . Hypertension Daughter   . Hypertension Daughter   . Hypertension Daughter     Reviw of Systems:  Reviewed in the HPI.  All other systems are negative.  Physical Exam: Blood pressure 158/90, pulse 92, height 5' (1.524 m), weight 137 lb 12.8 oz (62.506 kg), SpO2 92.00%. General: Well developed, well nourished, in no acute distress.  She was examined in the wheelchair.  Head: Normocephalic, atraumatic, sclera non-icteric, mucus membranes are moist,   Neck: Supple. Carotids are 2 + without bruits. No JVD  Lungs: Mostly clear.  Heart: regular rate.  normal  S1 S2. 2-3 / 6 systolic murmur.  There is a 1-2/6 diastolic murmur. Abdomen: Soft, non-tender, non-distended with normal bowel sounds. No hepatomegaly. No rebound/guarding. No masses.  Msk:  Strength and tone are normal  Extremities: No clubbing or cyanosis. trace edema.  Distal pedal pulses are 2+ and equal bilaterally.  Neuro: Alert and oriented X 3. Moves all extremities spontaneously.  Psych:  Responds to questions appropriately with a normal affect.  ECG:  Assessment / Plan:

## 2011-12-06 ENCOUNTER — Encounter: Payer: Self-pay | Admitting: Cardiovascular Disease

## 2012-01-21 ENCOUNTER — Encounter (HOSPITAL_COMMUNITY): Payer: Self-pay | Admitting: Unknown Physician Specialty

## 2012-01-21 ENCOUNTER — Inpatient Hospital Stay (HOSPITAL_COMMUNITY)
Admission: EM | Admit: 2012-01-21 | Discharge: 2012-01-28 | DRG: 291 | Disposition: A | Discharge status: Home / Self Care | Payer: Medicare Other | Attending: Family Medicine | Admitting: Family Medicine

## 2012-01-21 ENCOUNTER — Emergency Department (HOSPITAL_COMMUNITY): Payer: Medicare Other

## 2012-01-21 DIAGNOSIS — Z79899 Other long term (current) drug therapy: Secondary | ICD-10-CM

## 2012-01-21 DIAGNOSIS — I160 Hypertensive urgency: Secondary | ICD-10-CM

## 2012-01-21 DIAGNOSIS — J309 Allergic rhinitis, unspecified: Secondary | ICD-10-CM

## 2012-01-21 DIAGNOSIS — E440 Moderate protein-calorie malnutrition: Secondary | ICD-10-CM | POA: Diagnosis present

## 2012-01-21 DIAGNOSIS — R7309 Other abnormal glucose: Secondary | ICD-10-CM | POA: Diagnosis present

## 2012-01-21 DIAGNOSIS — I499 Cardiac arrhythmia, unspecified: Secondary | ICD-10-CM

## 2012-01-21 DIAGNOSIS — R197 Diarrhea, unspecified: Secondary | ICD-10-CM | POA: Diagnosis present

## 2012-01-21 DIAGNOSIS — Z8679 Personal history of other diseases of the circulatory system: Secondary | ICD-10-CM

## 2012-01-21 DIAGNOSIS — I359 Nonrheumatic aortic valve disorder, unspecified: Secondary | ICD-10-CM | POA: Diagnosis present

## 2012-01-21 DIAGNOSIS — K5730 Diverticulosis of large intestine without perforation or abscess without bleeding: Secondary | ICD-10-CM

## 2012-01-21 DIAGNOSIS — E785 Hyperlipidemia, unspecified: Secondary | ICD-10-CM | POA: Diagnosis present

## 2012-01-21 DIAGNOSIS — K222 Esophageal obstruction: Secondary | ICD-10-CM

## 2012-01-21 DIAGNOSIS — I10 Essential (primary) hypertension: Secondary | ICD-10-CM | POA: Diagnosis present

## 2012-01-21 DIAGNOSIS — C801 Malignant (primary) neoplasm, unspecified: Secondary | ICD-10-CM

## 2012-01-21 DIAGNOSIS — I2510 Atherosclerotic heart disease of native coronary artery without angina pectoris: Secondary | ICD-10-CM | POA: Diagnosis present

## 2012-01-21 DIAGNOSIS — R0603 Acute respiratory distress: Secondary | ICD-10-CM | POA: Diagnosis present

## 2012-01-21 DIAGNOSIS — R079 Chest pain, unspecified: Secondary | ICD-10-CM

## 2012-01-21 DIAGNOSIS — I5031 Acute diastolic (congestive) heart failure: Principal | ICD-10-CM | POA: Diagnosis present

## 2012-01-21 DIAGNOSIS — E039 Hypothyroidism, unspecified: Secondary | ICD-10-CM | POA: Diagnosis present

## 2012-01-21 DIAGNOSIS — N189 Chronic kidney disease, unspecified: Secondary | ICD-10-CM

## 2012-01-21 DIAGNOSIS — I5043 Acute on chronic combined systolic (congestive) and diastolic (congestive) heart failure: Secondary | ICD-10-CM

## 2012-01-21 DIAGNOSIS — J40 Bronchitis, not specified as acute or chronic: Secondary | ICD-10-CM

## 2012-01-21 DIAGNOSIS — K219 Gastro-esophageal reflux disease without esophagitis: Secondary | ICD-10-CM | POA: Diagnosis present

## 2012-01-21 DIAGNOSIS — I6350 Cerebral infarction due to unspecified occlusion or stenosis of unspecified cerebral artery: Secondary | ICD-10-CM

## 2012-01-21 DIAGNOSIS — I5033 Acute on chronic diastolic (congestive) heart failure: Secondary | ICD-10-CM

## 2012-01-21 DIAGNOSIS — J9600 Acute respiratory failure, unspecified whether with hypoxia or hypercapnia: Secondary | ICD-10-CM | POA: Diagnosis present

## 2012-01-21 DIAGNOSIS — N179 Acute kidney failure, unspecified: Secondary | ICD-10-CM | POA: Diagnosis present

## 2012-01-21 DIAGNOSIS — I129 Hypertensive chronic kidney disease with stage 1 through stage 4 chronic kidney disease, or unspecified chronic kidney disease: Secondary | ICD-10-CM | POA: Diagnosis present

## 2012-01-21 DIAGNOSIS — F411 Generalized anxiety disorder: Secondary | ICD-10-CM | POA: Diagnosis present

## 2012-01-21 DIAGNOSIS — R269 Unspecified abnormalities of gait and mobility: Secondary | ICD-10-CM

## 2012-01-21 DIAGNOSIS — R1319 Other dysphagia: Secondary | ICD-10-CM | POA: Diagnosis present

## 2012-01-21 DIAGNOSIS — J9620 Acute and chronic respiratory failure, unspecified whether with hypoxia or hypercapnia: Secondary | ICD-10-CM

## 2012-01-21 DIAGNOSIS — Z8673 Personal history of transient ischemic attack (TIA), and cerebral infarction without residual deficits: Secondary | ICD-10-CM

## 2012-01-21 DIAGNOSIS — I509 Heart failure, unspecified: Secondary | ICD-10-CM | POA: Diagnosis present

## 2012-01-21 HISTORY — DX: Adverse effect of unspecified anesthetic, initial encounter: T41.45XA

## 2012-01-21 HISTORY — DX: Other complications of anesthesia, initial encounter: T88.59XA

## 2012-01-21 HISTORY — DX: Chronic kidney disease, unspecified: N18.9

## 2012-01-21 LAB — URINE MICROSCOPIC-ADD ON

## 2012-01-21 LAB — PROTIME-INR
INR: 1.16 (ref 0.00–1.49)
Prothrombin Time: 14.6 seconds (ref 11.6–15.2)

## 2012-01-21 LAB — CBC WITH DIFFERENTIAL/PLATELET
Basophils Absolute: 0 10*3/uL (ref 0.0–0.1)
Basophils Relative: 0 % (ref 0–1)
Eosinophils Absolute: 0.1 10*3/uL (ref 0.0–0.7)
HCT: 38 % (ref 36.0–46.0)
Hemoglobin: 11.7 g/dL — ABNORMAL LOW (ref 12.0–15.0)
MCH: 28.8 pg (ref 26.0–34.0)
MCHC: 30.8 g/dL (ref 30.0–36.0)
Monocytes Absolute: 0.3 10*3/uL (ref 0.1–1.0)
Monocytes Relative: 6 % (ref 3–12)
Neutrophils Relative %: 82 % — ABNORMAL HIGH (ref 43–77)
RDW: 15.3 % (ref 11.5–15.5)

## 2012-01-21 LAB — CK TOTAL AND CKMB (NOT AT ARMC)
Total CK: 53 U/L (ref 7–177)
Total CK: 46 U/L (ref 7–177)

## 2012-01-21 LAB — URINALYSIS, ROUTINE W REFLEX MICROSCOPIC
Glucose, UA: NEGATIVE mg/dL
Leukocytes, UA: NEGATIVE
Specific Gravity, Urine: 1.007 (ref 1.005–1.030)
pH: 5 (ref 5.0–8.0)

## 2012-01-21 LAB — MAGNESIUM: Magnesium: 2 mg/dL (ref 1.5–2.5)

## 2012-01-21 LAB — TROPONIN I
Troponin I: <0.3 ng/mL (ref <0.30)
Troponin I: <0.3 ng/mL (ref <0.30)

## 2012-01-21 LAB — MRSA PCR SCREENING: MRSA by PCR: NEGATIVE

## 2012-01-21 LAB — BASIC METABOLIC PANEL
BUN: 31 mg/dL — ABNORMAL HIGH (ref 6–23)
Creatinine, Ser: 1.69 mg/dL — ABNORMAL HIGH (ref 0.50–1.10)
GFR calc Af Amer: 29 mL/min — ABNORMAL LOW (ref >90)
GFR calc non Af Amer: 25 mL/min — ABNORMAL LOW (ref >90)

## 2012-01-21 LAB — POCT I-STAT TROPONIN I: Troponin i, poc: 0.02 ng/mL (ref 0.00–0.08)

## 2012-01-21 MED ORDER — ALBUTEROL (5 MG/ML) CONTINUOUS INHALATION SOLN: 10.0000 mg/h | INHALATION_SOLUTION | Freq: Once | RESPIRATORY_TRACT | Status: DC

## 2012-01-21 MED ORDER — ISOSORBIDE DINITRATE 10 MG PO TABS
10.0000 mg | ORAL_TABLET | Freq: Two times a day (BID) | ORAL | Status: DC
  Administered 2012-01-21 – 2012-01-28 (×13): 10 mg via ORAL
  Filled 2012-01-21 (×15): qty 1

## 2012-01-21 MED ORDER — FUROSEMIDE 10 MG/ML IJ SOLN: 40.0000 mg | Freq: Two times a day (BID) | INTRAMUSCULAR | Status: DC

## 2012-01-21 MED ORDER — SODIUM CHLORIDE 0.9 % IJ SOLN: 3.0000 mL | INTRAMUSCULAR | Status: DC | PRN

## 2012-01-21 MED ORDER — CARVEDILOL 12.5 MG PO TABS: 12.5000 mg | ORAL_TABLET | Freq: Two times a day (BID) | ORAL | Status: DC

## 2012-01-21 MED ORDER — MELATONIN 3 MG PO TABS: 6.0000 mg | ORAL_TABLET | Freq: Every day | ORAL | Status: DC

## 2012-01-21 MED ORDER — HYDRALAZINE HCL 20 MG/ML IJ SOLN
10.0000 mg | Freq: Four times a day (QID) | INTRAMUSCULAR | Status: DC | PRN
  Administered 2012-01-22: 10 mg via INTRAVENOUS
  Filled 2012-01-21: qty 1
  Filled 2012-01-21: qty 0.5

## 2012-01-21 MED ORDER — SODIUM CHLORIDE 0.9 % IJ SOLN
3.0000 mL | Freq: Two times a day (BID) | INTRAMUSCULAR | Status: DC
  Administered 2012-01-21 – 2012-01-22 (×2): 3 mL via INTRAVENOUS
  Administered 2012-01-22: 09:00:00 via INTRAVENOUS
  Administered 2012-01-23 – 2012-01-28 (×8): 3 mL via INTRAVENOUS

## 2012-01-21 MED ORDER — NITROGLYCERIN IN D5W 200-5 MCG/ML-% IV SOLN: 5.0000 ug/min | INTRAVENOUS | Status: DC

## 2012-01-21 MED ORDER — ALBUTEROL SULFATE (5 MG/ML) 0.5% IN NEBU
INHALATION_SOLUTION | RESPIRATORY_TRACT | Status: AC
  Administered 2012-01-21: 5 mg
  Filled 2012-01-21: qty 1

## 2012-01-21 MED ORDER — CALCIUM CARBONATE ANTACID 500 MG PO CHEW
1.0000 | CHEWABLE_TABLET | Freq: Two times a day (BID) | ORAL | Status: DC
  Administered 2012-01-22 – 2012-01-28 (×10): 200 mg via ORAL
  Filled 2012-01-21 (×15): qty 1

## 2012-01-21 MED ORDER — GUAIFENESIN-DM 100-10 MG/5ML PO SYRP: 5.0000 mL | ORAL_SOLUTION | Freq: Three times a day (TID) | ORAL | Status: DC | PRN

## 2012-01-21 MED ORDER — ISOSORBIDE DINITRATE 10 MG PO TABS: 10.0000 mg | ORAL_TABLET | Freq: Two times a day (BID) | ORAL | Status: DC

## 2012-01-21 MED ORDER — ONDANSETRON HCL 4 MG/2ML IJ SOLN: 4.0000 mg | Freq: Four times a day (QID) | INTRAMUSCULAR | Status: DC | PRN

## 2012-01-21 MED ORDER — ENSURE PLUS PO LIQD
237.0000 mL | Freq: Every day | ORAL | Status: DC
  Administered 2012-01-22 – 2012-01-24 (×2): 237 mL via ORAL
  Filled 2012-01-21 (×5): qty 237

## 2012-01-21 MED ORDER — ASPIRIN EC 81 MG PO TBEC
81.0000 mg | DELAYED_RELEASE_TABLET | Freq: Every day | ORAL | Status: DC
  Administered 2012-01-23 – 2012-01-28 (×4): 81 mg via ORAL
  Filled 2012-01-21 (×8): qty 1

## 2012-01-21 MED ORDER — ACETAMINOPHEN 325 MG PO TABS: 650.0000 mg | ORAL_TABLET | ORAL | Status: DC | PRN

## 2012-01-21 MED ORDER — IPRATROPIUM BROMIDE 0.02 % IN SOLN
RESPIRATORY_TRACT | Status: AC
  Administered 2012-01-21: 0.5 mg
  Filled 2012-01-21: qty 2.5

## 2012-01-21 MED ORDER — ACETAMINOPHEN 325 MG PO TABS
650.0000 mg | ORAL_TABLET | ORAL | Status: DC | PRN
  Administered 2012-01-25 – 2012-01-26 (×2): 650 mg via ORAL
  Filled 2012-01-21 (×2): qty 2

## 2012-01-21 MED ORDER — FUROSEMIDE 10 MG/ML IJ SOLN
40.0000 mg | Freq: Once | INTRAMUSCULAR | Status: AC
  Administered 2012-01-21: 40 mg via INTRAVENOUS
  Filled 2012-01-21: qty 4

## 2012-01-21 MED ORDER — IPRATROPIUM BROMIDE 0.02 % IN SOLN: 0.5000 mg | RESPIRATORY_TRACT | Status: DC | PRN

## 2012-01-21 MED ORDER — PANTOPRAZOLE SODIUM 40 MG PO TBEC
40.0000 mg | DELAYED_RELEASE_TABLET | Freq: Every day | ORAL | Status: DC
  Administered 2012-01-22 – 2012-01-28 (×7): 40 mg via ORAL
  Filled 2012-01-21 (×7): qty 1

## 2012-01-21 MED ORDER — TEMAZEPAM 15 MG PO CAPS
30.0000 mg | ORAL_CAPSULE | Freq: Every day | ORAL | Status: DC
  Administered 2012-01-21 – 2012-01-22 (×2): 30 mg via ORAL
  Filled 2012-01-21 (×2): qty 2

## 2012-01-21 MED ORDER — LEVOTHYROXINE SODIUM 75 MCG PO TABS
75.0000 ug | ORAL_TABLET | Freq: Every day | ORAL | Status: DC
  Administered 2012-01-22 – 2012-01-28 (×7): 75 ug via ORAL
  Filled 2012-01-21 (×8): qty 1

## 2012-01-21 MED ORDER — POTASSIUM CHLORIDE CRYS ER 10 MEQ PO TBCR
10.0000 meq | EXTENDED_RELEASE_TABLET | Freq: Every day | ORAL | Status: DC
  Administered 2012-01-21 – 2012-01-23 (×3): 10 meq via ORAL
  Filled 2012-01-21 (×4): qty 1

## 2012-01-21 MED ORDER — CARVEDILOL 12.5 MG PO TABS
12.5000 mg | ORAL_TABLET | Freq: Two times a day (BID) | ORAL | Status: DC
  Administered 2012-01-21 – 2012-01-28 (×14): 12.5 mg via ORAL
  Filled 2012-01-21 (×16): qty 1

## 2012-01-21 MED ORDER — SODIUM CHLORIDE 0.9 % IV SOLN: 250.0000 mL | INTRAVENOUS | Status: DC | PRN

## 2012-01-21 MED ORDER — DEXTROSE 5 % IV SOLN
500.0000 mg | Freq: Once | INTRAVENOUS | Status: AC
  Administered 2012-01-21: 500 mg via INTRAVENOUS
  Filled 2012-01-21: qty 500

## 2012-01-21 MED ORDER — NITROGLYCERIN 0.4 MG SL SUBL: 0.4000 mg | SUBLINGUAL_TABLET | Freq: Once | SUBLINGUAL | Status: DC

## 2012-01-21 MED ORDER — ALBUTEROL SULFATE (5 MG/ML) 0.5% IN NEBU: 2.5000 mg | INHALATION_SOLUTION | RESPIRATORY_TRACT | Status: DC | PRN

## 2012-01-21 MED ORDER — DEXTROSE 5 % IV SOLN
1.0000 g | Freq: Once | INTRAVENOUS | Status: AC
  Administered 2012-01-21: 1 g via INTRAVENOUS
  Filled 2012-01-21: qty 10

## 2012-01-21 NOTE — ED Notes (Signed)
Patient has been having respiratory difficulty for apprx a month now. She presented via EMS today with increased respiratory distress. EMS states she was wheezing upper and rales in lower lobes, diminished. EMS administered solumedrol, albutrol, atrovent.

## 2012-01-21 NOTE — ED Provider Notes (Signed)
History     CSN: [id number]  Arrival date & time 01/21/12  1233   First MD Initiated Contact with Patient 01/21/12 1239      Chief Complaint  Patient presents with  . Respiratory Distress    (Consider location/radiation/quality/duration/timing/severity/associated sxs/prior treatment) HPI Comments: Patient with history of CAD status post angioplasty, CHF last EF measured July 2013 was 45-50%, hypertension, pulmonary hypertension, chronic kidney disease, history of nasopharyngeal carcinoma status post radiation more than 7 years ago -- presents with SOB and wheezing over the past month, worse today. Family member states that she was recently diagnosed with PNA and rx doxycycline but she d/c this. No fevers, N/V/D, chest pain, urinary sx. No lower extremity edema. No chest pain. Patient had worse SOB this AM and had trouble getting up from the toilet. EMS was called, they administered 10mg  albuterol, 0.5mg  atrovent, 125mg  solumedrol. Onset insidious. Course is gradually worsening. Nothing makes symptoms worse. Albuterol is helping.   The history is provided by the patient, a relative and medical records.    Past Medical History  Diagnosis Date  . Hyperlipidemia   . Hypertension   . Chronic kidney disease   . Hypothyroidism   . Cardiomegaly   . CHF (congestive heart failure)     Recent hospitalization for CHF & pneumonia -- EF is 40-45%  ( 03/16/10 by Dr. Peter [place] )    . Shortness of breath   . Dyspnea   . Stroke     history of   . History of rheumatic fever   . Head and neck cancer     status post radiation therapy  . Coronary artery disease     EF of 40-45% with history of angioplasty  . Pulmonary hypertension     with PA pressure estimated at 53 mmHg  . Pulmonary edema   . Hypoxia   . Aortic insufficiency     moderate-severe  . Arthritis   . Pneumonia     hx of  . Anemia     Past Surgical History  Procedure Date  . Tonsillectomy   . Thyroidectomy, partial   .  Hammer toe surgery   . Refractive surgery     both eyes  . Coronary angioplasty     with angioplasty  . Joint replacement     bilat  . Knee surgery     Family History  Problem Relation Age of Onset  . Stroke Father   . Ulcers Father   . Asthma Mother   . Cancer Brother   . Cancer Brother   . Cancer Brother   . Dementia Sister   . Congenital heart disease Sister   . Hypertension Son   . Hypertension Son   . Hypertension Son   . Hypertension Son   . Hypertension Son   . Hypertension Son   . Hypertension Son   . Hypertension Daughter   . Hypertension Daughter   . Hypertension Daughter     History  Substance Use Topics  . Smoking status: Never Smoker   . Smokeless tobacco: Never Used  . Alcohol Use: No    OB History    Grav Para Term Preterm Abortions TAB SAB Ect Mult Living                  Review of Systems  Constitutional: Positive for fatigue. Negative for fever.  HENT: Negative for sore throat and rhinorrhea.   Eyes: Negative for redness.  Respiratory: Positive for cough, shortness  of breath and wheezing.   Cardiovascular: Negative for chest pain and leg swelling.  Gastrointestinal: Negative for nausea, vomiting, abdominal pain and diarrhea.  Genitourinary: Negative for dysuria.  Musculoskeletal: Negative for myalgias.  Skin: Negative for rash.  Neurological: Negative for headaches.    Allergies  Enoxaparin sodium; Hydrocodone-acetaminophen; and Oxycodone-acetaminophen  Home Medications   Current Outpatient Rx  Name  Route  Sig  Dispense  Refill  . ACETAMINOPHEN 500 MG PO TABS   Oral   Take 500 mg by mouth as needed. Pain/fever         . CALCIUM CARBONATE ANTACID 500 MG PO CHEW   Oral   Chew 1 tablet by mouth 2 (two) times daily.          Marland Kitchen CARVEDILOL 6.25 MG PO TABS   Oral   Take 12.5 mg by mouth 2 (two) times daily with a meal.          . ENSURE COMPLETE PO LIQD   Oral   Take 237 mLs by mouth 2 (two) times daily between meals.          . FUROSEMIDE 20 MG PO TABS   Oral   Take 10 mg by mouth every other day.         . ISOSORBIDE DINITRATE 10 MG PO TABS   Oral   Take 10 mg by mouth 2 (two) times daily.          Marland Kitchen LEVOTHYROXINE SODIUM 75 MCG PO TABS   Oral   Take 75 mcg by mouth daily.           Marland Kitchen MELATONIN 3 MG PO TABS   Oral   Take 6 mg by mouth at bedtime.          Marland Kitchen OXYMETAZOLINE HCL 0.05 % NA SOLN   Nasal   Place 2 sprays into the nose as needed.          Marland Kitchen PANTOPRAZOLE SODIUM 40 MG PO TBEC   Oral   Take 40 mg by mouth daily.         Marland Kitchen POTASSIUM CHLORIDE CRYS ER 10 MEQ PO TBCR   Oral   Take 1 tablet (10 mEq total) by mouth daily.   30 tablet   5   . TEMAZEPAM 30 MG PO CAPS   Oral   Take 30 mg by mouth at bedtime.             BP 153/110  Resp 24  SpO2 95%  Physical Exam  Nursing note and vitals reviewed. Constitutional: She appears well-developed and well-nourished.  HENT:  Head: Normocephalic and atraumatic.  Eyes: Conjunctivae normal are normal. Right eye exhibits no discharge. Left eye exhibits no discharge.  Neck: Normal range of motion. Neck supple. No JVD present.  Cardiovascular: Normal rate, regular rhythm and normal heart sounds.   Pulmonary/Chest: She is in respiratory distress. She has wheezes (moderate bilaterally).       + accessory muscle use  Abdominal: Soft. There is no tenderness. There is no rebound and no guarding.  Musculoskeletal: She exhibits no edema and no tenderness.  Neurological: She is alert.  Skin: Skin is warm and dry.  Psychiatric: She has a normal mood and affect.    ED Course  Procedures (including critical care time)  Labs Reviewed  CBC WITH DIFFERENTIAL - Abnormal; Notable for the following:    Hemoglobin 11.7 (*)     Neutrophils Relative 82 (*)     Lymphocytes Relative  11 (*)     Lymphs Abs 0.5 (*)     All other components within normal limits  BASIC METABOLIC PANEL - Abnormal; Notable for the following:    Glucose, Bld  129 (*)     BUN 31 (*)     Creatinine, Ser 1.69 (*)     GFR calc non Af Amer 25 (*)     GFR calc Af Amer 29 (*)     All other components within normal limits  PRO B NATRIURETIC PEPTIDE - Abnormal; Notable for the following:    Pro B Natriuretic peptide (BNP) 12008.0 (*)     All other components within normal limits  CG4 I-STAT (LACTIC ACID)  POCT I-STAT TROPONIN I  CK TOTAL AND CKMB  CK TOTAL AND CKMB  CK TOTAL AND CKMB  TROPONIN I  TROPONIN I  TROPONIN I   Dg Chest 2 View  01/21/2012  *RADIOLOGY REPORT*  Clinical Data: 78 year old female shortness of breath and respiratory distress.  CHEST - 2 VIEW  Comparison: 12/31/2011 and prior chest radiographs  Findings: Cardiomegaly is again noted. This is a low-volume film with pointy vascular congestion, small bilateral pleural effusions and interstitial opacities - likely representing pulmonary edema. Bibasilar opacities are present likely representing atelectasis but superimposed pneumonia is difficult to exclude. There is no evidence of pneumothorax or acute bony abnormality.  IMPRESSION: Low-volume film with pulmonary vascular congestion, probable interstitial pulmonary edema, small bilateral pleural effusions and bibasilar atelectasis.  Superimposed pneumonia is difficult to exclude.   Original Report Authenticated By: Harmon Pier, M.D.      1. Congestive heart failure   2. Hypertensive urgency     12:49 PM Patient seen and examined. Work-up initiated. Medications ordered.   Vital signs reviewed and are as follows: Filed Vitals:   01/21/12 1237  BP: 153/110  Resp: 24  BP 153/110  Resp 24  SpO2 95%   Patient was much improved after 3rd treatment. She appears more comfortable and is breathing easily.  Chest x-ray reviewed by myself. Pulmonary edema. Question of pneumonia. Treatment started as patient is 78yo.   Patients more hypertensive than arrival. Nitroglycerin sublingual and IV started.  Patient admitted to triad for  congestive heart exacerbation and hypertensive urgency.   MDM  CHF, hypertensive urgency, ? PNA.         Renne Crigler, [place] 01/21/12 1626

## 2012-01-21 NOTE — H&P (Signed)
[facility] History and Physical  [name] RUE:[id number] DOB: [birth year] DOA: 01/21/2012  Referring physician: Dr Renae Gloss PCP: [name], MD  Specialists: [facility]: Dr Antoine Poche  Chief Complaint: SOB  HPI: [name] is a 78 y.o. female with past medical history of hypertension, questionable hyperlipidemia, chronic kidney disease, hypothyroidism, history of CHF who presents to the ED with a 2-3 week history of worsening shortness of breath on exertion. Patient's daughter is at bedside stated that symptoms began on 12/31/2011 with shortness of breath. Patient presented to PCPs office and was being treated for bronchitis. Per family patient's shortness of breath has been worsening since then to the point whereby on the day of admission patient went to the bathroom and unable to get off the toilet secondary to worsening shortness of breath and generalized weakness. Associated symptoms include wheezing, increased oxygen requirements, diaphoresis, clamminess, back pain below the lungs per family. Patient denied any fever, no chills, no cough, no nausea, no vomiting, no constipation, no diarrhea, patient does endorse occasional abdominal pain some dysuria and generalized weakness. Patient's daughter stated that the night prior to admission patient was wheezing heavily such that she had to increase the O2 requirement and patient was panting. On the morning of admission patient went to the bathroom couldn't get off the toilet and was so short of breath that EMS was called. Per family EMS on arrival noted patient to have sats of 89% patient was given a breathing treatment at home and also on route to the ED with some improvement in his symptoms. Patient presented to the ED was given some nebulizer treatments as well as IV Solu-Medrol and nitroglycerin sublingual. In the emergency room labs obtained showed a pro BNP of 12,008. Basic metabolic profile had a BUN of 31 and a  creatinine of 1.69. Troponin I was 0.02. EKG shows some T wave inversions in leads 23 aVF and V4 through V6. CBC had a hemoglobin of 11.7 white count of 4.5 and a platelet count of 167. Chest x-ray done showed bilateral pleural effusions and pulmonary edema. Patient was given a dose of IV Lasix x1. We were called to admit the patient for further evaluation and management.  Review of Systems: The patient denies anorexia, fever, weight loss,, vision loss, decreased hearing, hoarseness, chest pain, syncope, dyspnea on exertion, peripheral edema, balance deficits, hemoptysis, abdominal pain, melena, hematochezia, severe indigestion/heartburn, hematuria, incontinence, genital sores, muscle weakness, suspicious skin lesions, transient blindness, difficulty walking, depression, unusual weight change, abnormal bleeding, enlarged lymph nodes, angioedema, and breast masses.   Past Medical History  Diagnosis Date  . Hyperlipidemia   . Hypertension   . Chronic kidney disease   . Hypothyroidism   . Cardiomegaly   . CHF (congestive heart failure)     Recent hospitalization for CHF & pneumonia -- EF is 40-45%  ( 03/16/10 by Dr. Peter [place] )    . Shortness of breath   . Dyspnea   . Stroke     history of   . History of rheumatic fever   . Head and neck cancer     status post radiation therapy  . Coronary artery disease     EF of 40-45% with history of angioplasty  . Pulmonary hypertension     with PA pressure estimated at 53 mmHg  . Pulmonary edema   . Hypoxia   . Aortic insufficiency     moderate-severe  . Arthritis   . Pneumonia     hx of  .  Anemia   . CKD (chronic kidney disease) 01/21/2012   Past Surgical History  Procedure Date  . Tonsillectomy   . Thyroidectomy, partial   . Hammer toe surgery   . Refractive surgery     both eyes  . Coronary angioplasty     with angioplasty  . Joint replacement     bilat  . Knee surgery    Social History:  reports that she has never smoked. She has  never used smokeless tobacco. She reports that she does not drink alcohol or use illicit drugs.  Allergies  Allergen Reactions  . Demerol (Meperidine)   . Doxycycline   . Enoxaparin Sodium     Unknown  . Hydrocodone-Acetaminophen     syncope  . Oxycodone-Acetaminophen     Unknown  . Red Dye     Family History  Problem Relation Age of Onset  . Stroke Father   . Ulcers Father   . Asthma Mother   . Cancer Brother   . Cancer Brother   . Cancer Brother   . Dementia Sister   . Congenital heart disease Sister   . Hypertension Son   . Hypertension Son   . Hypertension Son   . Hypertension Son   . Hypertension Son   . Hypertension Son   . Hypertension Son   . Hypertension Daughter   . Hypertension Daughter   . Hypertension Daughter     Prior to Admission medications   Medication Sig Start Date End Date Taking? Authorizing Provider  acetaminophen (TYLENOL) 500 MG tablet Take 500 mg by mouth as needed. Pain/fever   Yes Historical Provider, MD  calcium carbonate (TUMS - DOSED IN MG ELEMENTAL CALCIUM) 500 MG chewable tablet Chew 1 tablet by mouth 2 (two) times daily.    Yes Historical Provider, MD  carvedilol (COREG) 12.5 MG tablet Take 12.5 mg by mouth 2 (two) times daily with a meal.   Yes Historical Provider, MD  Chlorpheniramine-DM (CORICIDIN HBP COUGH/COLD PO) Take 1 tablet by mouth 2 (two) times daily.   Yes Historical Provider, MD  Ensure Plus (ENSURE PLUS) LIQD Take 237 mLs by mouth daily.   Yes Historical Provider, MD  furosemide (LASIX) 20 MG tablet Take 10 mg by mouth every other day. 09/24/11 09/23/12 Yes Altha Harm, MD  guaiFENesin-dextromethorphan (ROBITUSSIN DM) 100-10 MG/5ML syrup Take 5 mLs by mouth 3 (three) times daily as needed. For cough   Yes Historical Provider, MD  isosorbide dinitrate (ISORDIL) 10 MG tablet Take 10 mg by mouth 2 (two) times daily.    Yes Historical Provider, MD  levothyroxine (SYNTHROID, LEVOTHROID) 75 MCG tablet Take 75 mcg by mouth  daily.     Yes Historical Provider, MD  Melatonin 3 MG TABS Take 6 mg by mouth at bedtime.    Yes Historical Provider, MD  pantoprazole (PROTONIX) 40 MG tablet Take 40 mg by mouth daily.   Yes Historical Provider, MD  potassium chloride (K-DUR,KLOR-CON) 10 MEQ tablet Take 1 tablet (10 mEq total) by mouth daily. 08/24/11 08/23/12 Yes Vesta Mixer, MD  temazepam (RESTORIL) 30 MG capsule Take 30 mg by mouth at bedtime.     Yes Historical Provider, MD   Physical Exam: Filed Vitals:   01/21/12 1237 01/21/12 1300 01/21/12 1329 01/21/12 1330  BP: 153/110 210/91  215/105  Pulse:  91  93  Resp: 24 30  28   SpO2: 95% 96% 98% 99%     General:  Well-developed well-nourished laying on a gurney speaking  in full sentences with some use of accessory muscles of respiration.  Eyes: Pupils equal round and reactive to light and accommodation. Extraocular movements intact.  ENT: Oropharynx is clear, no lesions, no exudates.  Neck: Supple with no lymphadenopathy.  Cardiovascular: Regular rate rhythm with a 3/6 systolic ejection murmur. Trace to 1+ bilateral lower extremity edema.  Respiratory: Decreased breath sounds in the bases. Diffuse crackles.  Abdomen: Soft, nontender, nondistended, positive bowel sounds.  Skin: No rashes or lesions.  Musculoskeletal: 4/5 bilateral upper extremity strength. 4/5 bilateral lower extremity strength.  Psychiatric: Normal mood. Normal affect. Good insight. Good judgment.  Neurologic: Alert and or to x3. Cranial nerves II through XII are grossly intact. No focal deficits.  Labs on Admission:  Basic Metabolic Panel:  Lab 01/21/12 3086  NA 145  K 4.3  CL 108  CO2 27  GLUCOSE 129*  BUN 31*  CREATININE 1.69*  CALCIUM 9.3  MG --  PHOS --   Liver Function Tests: No results found for this basename: AST:5,ALT:5,ALKPHOS:5,BILITOT:5,PROT:5,ALBUMIN:5 in the last 168 hours No results found for this basename: LIPASE:5,AMYLASE:5 in the last 168 hours No results  found for this basename: AMMONIA:5 in the last 168 hours CBC:  Lab 01/21/12 1320  WBC 4.5  NEUTROABS 3.7  HGB 11.7*  HCT 38.0  MCV 93.6  PLT 167   Cardiac Enzymes: No results found for this basename: CKTOTAL:5,CKMB:5,CKMBINDEX:5,TROPONINI:5 in the last 168 hours  BNP (last 3 results)  Basename 01/21/12 1320 08/24/11 1455  PROBNP 12008.0* 1104.0*   CBG: No results found for this basename: GLUCAP:5 in the last 168 hours  Radiological Exams on Admission: Dg Chest 2 View  01/21/2012  *RADIOLOGY REPORT*  Clinical Data: 78 year old female shortness of breath and respiratory distress.  CHEST - 2 VIEW  Comparison: 12/31/2011 and prior chest radiographs  Findings: Cardiomegaly is again noted. This is a low-volume film with pointy vascular congestion, small bilateral pleural effusions and interstitial opacities - likely representing pulmonary edema. Bibasilar opacities are present likely representing atelectasis but superimposed pneumonia is difficult to exclude. There is no evidence of pneumothorax or acute bony abnormality.  IMPRESSION: Low-volume film with pulmonary vascular congestion, probable interstitial pulmonary edema, small bilateral pleural effusions and bibasilar atelectasis.  Superimposed pneumonia is difficult to exclude.   Original Report Authenticated By: Harmon Pier, M.D.     EKG: Independently reviewed. T-wave inversions in leads 2,3 aVF. V4 through V6  Assessment/Plan Principal Problem:  *Acute respiratory distress Active Problems:  HYPOTHYROIDISM  CORONARY ARTERY DISEASE  GERD  DYSPHAGIA  Acute exacerbation of CHF (congestive heart failure)  Malignant hypertension  CKD (chronic kidney disease)  #1 acute respiratory distress Secondary to an acute CHF exacerbation. Patient is noted to have a pro BNP of 12,008. Patient with EKG changes of T-wave inversions in leads 23 aVF and V4 through V6 with also a right axis. Will repeat EKG. Patient on clinical exam has decreased  breath sounds in the bases with diffuse crackles. Chest x-ray consistent with pulmonary edema. We'll admit patient to the step down unit for closer monitoring. Strict I.'s and O.'s. Daily weights. Place on Lasix 40 mg IV every 12 hours. We'll cycle cardiac enzymes every 6 hours x3. Check a TSH. Check a fasting lipid panel. Check a 2-D echo. Continue home regimen of Coreg and Intal. Aspirin. Oxygen. If patient's symptoms worsen may consider BiPAP as needed. Cardiology has been consulted and appreciate input and recommendations.  #2 acute systolic CHF exacerbation Questionable etiology. We'll admit patient to  the step down unit. We'll cycle cardiac enzymes every 6 hours x3. Follow the pro BNP. Repeat EKG. Patient does have diffuse crackles on exam and decreased breath sounds in the bases. Continue Coreg and Imdur will. Check a fasting lipid panel. Place on Lasix 40 mg IV every 12 hours. Cardiology is consulted and appreciate input and recommendations.  #3 hypothyroidism Check a TSH. Continue home dose Synthroid.  #4 malignant hypertension The patient indeed he was noted to have systolic blood pressures in the 200s. Per family patient has not taken her blood pressure medications over the past one to 2 days. Will resume patient back on a home regimen of Coreg Isordil and Lasix. Follow. If her systolic blood pressure consistently remains above 200, may consider starting patient on a drip. Follow. Cardiology following.  #5 gastroesophageal reflux disease PPI. I F. #6 history of dysphasia We'll place on dysphagia 3 diet.  #7 chronic kidney disease Stable. Follow.  #8 prophylaxis PPI for GI prophylaxis SCDs for DVT prophylaxis.  Code Status: Full Family Communication: Updated patient and daughter at bedside. Disposition Plan: Admit to SDU  Time spent: 70 mins  [facility] Pager [phone]  If 7PM-7AM, please contact night-coverage [email or web address] Password [email or web address] 01/21/2012,  4:14 PM

## 2012-01-21 NOTE — ED Notes (Signed)
Results of lactic acid shown to Dr. Sheldon 

## 2012-01-21 NOTE — Consult Note (Signed)
CARDIOLOGY CONSULT NOTE  Patient ID: [name], MRN: [id number], DOB/AGE: Jan 25, 1916 78 y.o. Admit date: 01/21/2012 Date of Consult: 01/21/2012  Primary Physician: [name], MD Primary Cardiologist: Dr. Elease Hashimoto  Chief Complaint: shortness of breath Reason for Consultation: CHF  HPI: 78 y.o. female w/ PMHx significant for CAD (s/p angioplasty '80s), Chronic Systolic/Diastolic CHF (EF 82-95%, grade 1DD), mild AI, HTN, Pulm HTN (PASP 70 mmHg, chronic O2), CKD (Stage 4), Nasopharyngeal CA (s/p radiation), CVA, and anemia who presented to [facility] on 01/21/2012 with complaints of shortness of breath.  Last hospitalized last from 8/4 - 09/24/11 with chest pain felt due to pneumonia (VQ scan neg for PE and ruled out for MI). Discharge weight was 133lbs. Seen by Dr. Elease Hashimoto 11/29/11 at which time it was noted she was doing well without s/sx of CHF. Weight was 137lbs and she was on Lasix 10mg  every other day. About one month ago she visited her daughter for a few weeks. During this time the bathroom wasn't as conveniently located for her resulting in her not taking Lasix regularly. Since that time she has noticed progressive shortness of breath and fatigue/weakness prompting her to present to the ED. She does not weigh herself daily, but reports when she does weigh herself it has been stable (around 137lbs). No LE edema or change in chronic orthopnea. She eats low sodium canned or homemade soup now. Has had a productive "sticky" cough. No fevers or chills. Denies chest pain, palpitations, or syncope. She lives at home with close supervision from her 10 children and 20+ grandchildren. She is able to perform ADLs and uses a walker.  In the ED, EKG shows NSR 93bpm with new RAD & inferolateral TWI. CXR shows pulm edema and small bilat pleural effusions, difficult to exclude superimposed PNA. Afebrile with no leukocytosis. Labs are significant for pBNP 12,008 (previously 1104), normal poc  troponin, K+ 4.3, BUN/Crt 31/1.69, Hgb 11.7. She was markedly hypertensive (215/104) upon arrival (has not taken antihypertensives x 2 days) for which she was placed on NTG drip with improvement (this has been stopped). She is resting comfortably on 5L O2 (wears 2L PRN at home). She has received 40mg  IV lasix with some improvement in breathing. She also received albuterol/atrovent neb and zithromax.     Past Medical History  Diagnosis Date  . Hyperlipidemia   . Hypertension   . Chronic kidney disease   . Hypothyroidism   . Cardiomegaly   . CHF (congestive heart failure)     Recent hospitalization for CHF & pneumonia -- EF is 40-45%  ( 03/16/10 by Dr. Peter [place] )    . Shortness of breath   . Dyspnea   . Stroke     history of   . History of rheumatic fever   . Head and neck cancer     status post radiation therapy  . Coronary artery disease     EF of 40-45% with history of angioplasty  . Pulmonary hypertension     with PA pressure estimated at 53 mmHg  . Pulmonary edema   . Hypoxia   . Aortic insufficiency     moderate-severe  . Arthritis   . Pneumonia     hx of  . Anemia      08/26/11 Echo Study Conclusions: - Left ventricle: Systolic function was mildly reduced. The estimated ejection fraction was in the range of 45% to 50%. Doppler parameters are consistent with abnormal left ventricular relaxation (grade 1 diastolic dysfunction). -  Aortic valve: Mild regurgitation. - Mitral valve: Mild regurgitation. - Tricuspid valve: Moderate regurgitation. - Pulmonary arteries: PA peak pressure: 70mm Hg (S).   Surgical History:  Past Surgical History  Procedure Date  . Tonsillectomy   . Thyroidectomy, partial   . Hammer toe surgery   . Refractive surgery     both eyes  . Coronary angioplasty     with angioplasty  . Joint replacement     bilat  . Knee surgery      Home Meds: Medication Sig  acetaminophen (TYLENOL) 500 MG tablet Take 500 mg by mouth as needed. Pain/fever   calcium carbonate (TUMS - DOSED IN MG ELEMENTAL CALCIUM) 500 MG chewable tablet Chew 1 tablet by mouth 2 (two) times daily.   carvedilol (COREG) 12.5 MG tablet Take 12.5 mg by mouth 2 (two) times daily with a meal.  Chlorpheniramine-DM (CORICIDIN HBP COUGH/COLD PO) Take 1 tablet by mouth 2 (two) times daily.  Ensure Plus (ENSURE PLUS) LIQD Take 237 mLs by mouth daily.  furosemide (LASIX) 20 MG tablet Take 10 mg by mouth every other day.  guaiFENesin-dextromethorphan (ROBITUSSIN DM) 100-10 MG/5ML syrup Take 5 mLs by mouth 3 (three) times daily as needed. For cough  isosorbide dinitrate (ISORDIL) 10 MG tablet Take 10 mg by mouth 2 (two) times daily.   levothyroxine (SYNTHROID, LEVOTHROID) 75 MCG tablet Take 75 mcg by mouth daily.    Melatonin 3 MG TABS Take 6 mg by mouth at bedtime.   pantoprazole (PROTONIX) 40 MG tablet Take 40 mg by mouth daily.  potassium chloride (K-DUR,KLOR-CON) 10 MEQ tablet Take 1 tablet (10 mEq total) by mouth daily.  temazepam (RESTORIL) 30 MG capsule Take 30 mg by mouth at bedtime.      Inpatient Medications:   . [COMPLETED] albuterol      . albuterol  10 mg/hr Nebulization Once  . [COMPLETED] furosemide  40 mg Intravenous Once  . [COMPLETED] ipratropium      . nitroGLYCERIN  0.4 mg Sublingual Once   . azithromycin (ZITHROMAX) 500 MG IVPB 500 mg (01/21/12 1444)  . cefTRIAXone (ROCEPHIN)  IV    . nitroGLYCERIN      Allergies:  Allergies  Allergen Reactions  . Demerol (Meperidine)   . Doxycycline   . Enoxaparin Sodium     Unknown  . Hydrocodone-Acetaminophen     syncope  . Oxycodone-Acetaminophen     Unknown  . Red Dye     History   Social History  . Marital Status: Widowed    Spouse Name: N/A    Number of Children: N/A  . Years of Education: N/A   Occupational History  . Not on file.   Social History Main Topics  . Smoking status: Never Smoker   . Smokeless tobacco: Never Used  . Alcohol Use: No  . Drug Use: No  . Sexually Active: Not  on file   Other Topics Concern  . Not on file   Social History Narrative  . No narrative on file     Family History  Problem Relation Age of Onset  . Stroke Father   . Ulcers Father   . Asthma Mother   . Cancer Brother   . Cancer Brother   . Cancer Brother   . Dementia Sister   . Congenital heart disease Sister   . Hypertension Son   . Hypertension Son   . Hypertension Son   . Hypertension Son   . Hypertension Son   . Hypertension Son   .  Hypertension Son   . Hypertension Daughter   . Hypertension Daughter   . Hypertension Daughter      Review of Systems: General: negative for chills, fever, night sweats or weight changes.  Cardiovascular: As per HPI Dermatological: negative for rash Respiratory: As per HPI Urologic: negative for hematuria Abdominal: negative for nausea, vomiting, diarrhea, bright red blood per rectum, melena, or hematemesis Neurologic: negative for visual changes, syncope, or dizziness All other systems reviewed and are otherwise negative except as noted above.  Labs:  Component Value Date   WBC 4.5 01/21/2012   HGB 11.7* 01/21/2012   HCT 38.0 01/21/2012   MCV 93.6 01/21/2012   PLT 167 01/21/2012    Lab 01/21/12 1320  NA 145  K 4.3  CL 108  CO2 27  BUN 31*  CREATININE 1.69*  CALCIUM 9.3  GLUCOSE 129*     01/21/2012 13:35  Troponin i, poc 0.02     08/24/2011 14:55 01/21/2012 13:20  Pro B Natriuretic peptide (BNP) 1104.0 (H) 12008.0 (H)   Radiology/Studies:   01/21/2012 -  CHEST - 2 VIEW   Findings: Cardiomegaly is again noted. This is a low-volume film with pointy vascular congestion, small bilateral pleural effusions and interstitial opacities - likely representing pulmonary edema. Bibasilar opacities are present likely representing atelectasis but superimposed pneumonia is difficult to exclude. There is no evidence of pneumothorax or acute bony abnormality.  IMPRESSION: Low-volume film with pulmonary vascular congestion, probable  interstitial pulmonary edema, small bilateral pleural effusions and bibasilar atelectasis.  Superimposed pneumonia is difficult to exclude.    EKG: 01/21/12 @ 1325 - NSR 93bpm, RAD, inferolateral TWI  Physical Exam: Blood pressure 215/105, pulse 93, resp. rate 28, SpO2 99.00%. General: Pleasant elderly black female, in no acute distress. Head: Normocephalic, atraumatic, sclera non-icteric, no xanthomas, nares are without discharge.  Neck: Supple. Negative for carotid bruits. (+) mild JVD. Lungs: Bilat rales to mid lung fields, scattered wheezes. Breathing is unlabored on supplemental O2. Heart: RRR with S1 S2. 2/6 systolic murmur LUSB. No rubs, or gallops appreciated. Abdomen: Soft, non-tender, non-distended with normoactive bowel sounds.  No rebound/guarding. No obvious abdominal masses. Msk:  Strength and tone appear normal for age. Extremities: No clubbing or cyanosis. No edema.  Distal pedal pulses are intact and equal bilaterally. Neuro: Alert and oriented X 3. Moves all extremities spontaneously. Psych:  Responds to questions appropriately with a normal affect.   Assessment and Plan:   78 y.o. female w/ PMHx significant for CAD (s/p angioplasty '80s), Chronic Systolic/Diastolic CHF (EF 01-02%, grade 1DD), mild AI, HTN, Pulm HTN (PASP 70 mmHg, chronic O2), CKD (Stage 4), Nasopharyngeal CA (s/p radiation), CVA, and anemia who presented to [facility] on 01/21/2012 with complaints of shortness of breath.  1. Acute on Chronic Systolic/Diastolic CHF 2. Acute on Chronic Respiratory Failure 3. Malignant HTN  4. Abnormal EKG 5. Pulmonary HTN 6. CKD (baseline Crt ~1.2-1.4) 7. Anemia  Patient presents with ~ one month of progressive shortness of breath likely related to decreased diuretic use. Clinical presentation consistent with Acute on Chronic CHF. She will need admission for IV diuresis and close monitoring of renal function and electrolytes. Recommend 40mg  IV Lasix BID with  further dosing based on volume status and renal function. Continue 10 mEq KCL daily and adjust as needed. No indication for repeat echocardiogram as she just had one in July and not likely to change management. Daily weights, strict I/Os, fluid restriction. SBPs improving with resumption of home meds. Will adjust  home meds as needed (consider addition of hydralazine given CHF and inability to start ACEI/ARB w/ renal insuff). Discussed importance of daily weights with sliding scale Lasix. She has fantastic family support, but would benefit from home health to draw labs. Will ask case management to assist in arranging home health RN. EKG shows new right axis deviation and inferolateral TWI. No anginal symptoms and poc troponin normal. Do not suspect ACS. No indication for further ischemic evaluation given age and comorbidities, would treat conservatively.    Signed, HOPE, JESSICA PA-C 01/21/2012, 3:23 PM   History and all data above reviewed.  Patient examined.  I agree with the findings as above.  The patient is a perfectly lovely 78 year old with a very attentive family.  She presents with evidence of volume overload complicated by a history of renal insufficiency.  CXR demonstrates significant edema.The patient exam reveals COR:RRR  ,  Lungs: Decreased breath sounds with acute edema  ,  Abd: Positive bowel sounds, no rebound no guarding, Ext  No edea  .  All available labs, radiology testing, previous records reviewed. Agree with documented assessment and plan. I agree with plans for diuresis although we will need to be careful given her age, frailty and CKD.  I would use 40 IV Lasix today and reassess this in the AM.  No need for an echo.  Enzymes are being cycled but I doubt any acute coronary events.    Fayrene Fearing [place]  5:26 PM  01/21/2012

## 2012-01-22 ENCOUNTER — Inpatient Hospital Stay (HOSPITAL_COMMUNITY): Payer: Medicare Other

## 2012-01-22 DIAGNOSIS — J984 Other disorders of lung: Secondary | ICD-10-CM

## 2012-01-22 DIAGNOSIS — K219 Gastro-esophageal reflux disease without esophagitis: Secondary | ICD-10-CM

## 2012-01-22 DIAGNOSIS — N179 Acute kidney failure, unspecified: Secondary | ICD-10-CM

## 2012-01-22 DIAGNOSIS — K222 Esophageal obstruction: Secondary | ICD-10-CM

## 2012-01-22 LAB — TROPONIN I: Troponin I: <0.3 ng/mL (ref <0.30)

## 2012-01-22 LAB — CBC
MCH: 27.6 pg (ref 26.0–34.0)
MCHC: 30.5 g/dL (ref 30.0–36.0)
RDW: 14.9 % (ref 11.5–15.5)

## 2012-01-22 LAB — BASIC METABOLIC PANEL
CO2: 31 mEq/L (ref 19–32)
Chloride: 101 mEq/L (ref 96–112)
Glucose, Bld: 132 mg/dL — ABNORMAL HIGH (ref 70–99)
Potassium: 4.3 mEq/L (ref 3.5–5.1)
Sodium: 142 mEq/L (ref 135–145)

## 2012-01-22 LAB — LIPID PANEL: Cholesterol: 168 mg/dL (ref 0–200)

## 2012-01-22 LAB — CK TOTAL AND CKMB (NOT AT ARMC)
CK, MB: 3.1 ng/mL (ref 0.3–4.0)
Relative Index: INVALID (ref 0.0–2.5)

## 2012-01-22 LAB — TSH: TSH: 1.515 u[IU]/mL (ref 0.350–4.500)

## 2012-01-22 LAB — PRO B NATRIURETIC PEPTIDE: Pro B Natriuretic peptide (BNP): 28193 pg/mL — ABNORMAL HIGH (ref 0–450)

## 2012-01-22 MED ORDER — GUAIFENESIN-DM 100-10 MG/5ML PO SYRP: 5.0000 mL | ORAL_SOLUTION | Freq: Three times a day (TID) | ORAL | Status: DC

## 2012-01-22 MED ORDER — ALUM & MAG HYDROXIDE-SIMETH 200-200-20 MG/5ML PO SUSP
15.0000 mL | ORAL | Status: DC | PRN
  Administered 2012-01-24: 15 mL via ORAL
  Filled 2012-01-22: qty 30

## 2012-01-22 MED ORDER — DEXTROMETHORPHAN POLISTIREX 30 MG/5ML PO LQCR
30.0000 mg | Freq: Two times a day (BID) | ORAL | Status: DC
  Administered 2012-01-22 – 2012-01-23 (×3): 30 mg via ORAL
  Filled 2012-01-22 (×4): qty 5

## 2012-01-22 MED ORDER — GUAIFENESIN ER 600 MG PO TB12
600.0000 mg | ORAL_TABLET | Freq: Two times a day (BID) | ORAL | Status: DC
  Administered 2012-01-22 – 2012-01-28 (×13): 600 mg via ORAL
  Filled 2012-01-22 (×14): qty 1

## 2012-01-22 MED ORDER — AMLODIPINE BESYLATE 5 MG PO TABS
5.0000 mg | ORAL_TABLET | Freq: Every day | ORAL | Status: DC
  Administered 2012-01-22 – 2012-01-23 (×2): 5 mg via ORAL
  Filled 2012-01-22 (×3): qty 1

## 2012-01-22 MED ORDER — FUROSEMIDE 10 MG/ML IJ SOLN
40.0000 mg | Freq: Two times a day (BID) | INTRAMUSCULAR | Status: DC
  Administered 2012-01-22 – 2012-01-23 (×3): 40 mg via INTRAVENOUS
  Filled 2012-01-22 (×6): qty 4

## 2012-01-22 MED ORDER — AZITHROMYCIN 500 MG PO TABS
500.0000 mg | ORAL_TABLET | Freq: Every day | ORAL | Status: AC
  Administered 2012-01-22 – 2012-01-23 (×2): 500 mg via ORAL
  Filled 2012-01-22 (×2): qty 1

## 2012-01-22 NOTE — ED Provider Notes (Signed)
Medical screening examination/treatment/procedure(s) were conducted as a shared visit with non-physician practitioner(s) and myself.  I personally evaluated the patient during the encounter  Pt with multi-factorial dyspnea, worsening over the last few days, significant wheezing, CXR concerning for CHF. Admit for diuresis.   Yosiel Thieme B. Bernette Mayers, MD 01/22/12 [phone]

## 2012-01-22 NOTE — Progress Notes (Signed)
[facility] PROGRESS NOTE  ZONA PEDRO RUE:[id number] DOB: [birth year] DOA: 01/21/2012 PCP: [name], MD  Brief narrative: [name] is a 78 y.o. female with past medical history of hypertension, questionable hyperlipidemia, chronic kidney disease, hypothyroidism, history of CHF who presents to the ED with a 2-3 week history of worsening shortness of breath on exertion. Patient's daughter is at bedside stated that symptoms began on 12/31/2011 with shortness of breath. Patient presented to PCPs office and was being treated for bronchitis. Per family patient's shortness of breath has been worsening since then to the point whereby on the day of admission patient went to the bathroom and unable to get off the toilet secondary to worsening shortness of breath and generalized weakness. The patient's daughters also state the patient was coughing up yellow sputum for a couple of days   Assessment/Plan: Principal Problem:  *Acute respiratory distress  Acute exacerbation of CHF (congestive heart failure) Diuresis per Cardiology Pt did not take her Lasix for about 2 wks per history obtained by cardiology   Malignant hypertension Coming under control- Norvasc added by cards today  Bronchitis? Cough with yellow sputum- cont zithromax for total of 3 days   HYPOTHYROIDISM   CORONARY ARTERY DISEASE   GERD   CKD (chronic kidney disease) 3   Code Status: full Family Communication: daughters in room Disposition Plan: SDU DVT prophylaxis:scds   Consultants:  [place] cards  Procedures:  none  Antibiotics: Rocephin 12/6 Zithromax 12/6 >>  HPI/Subjective: Pt admits to feeling much better- breathing better- per daughters cough is present but amount of sputum has decreased but pt is sore in the lower abdomen due to coughing- asking for cough surpressant.   Objective: Filed Vitals:   01/22/12 0716 01/22/12 1119 01/22/12 1120 01/22/12 1609  BP:   155/56   Pulse:    82   Temp: 97.4 F (36.3 C) 97.3 F (36.3 C)  97.9 F (36.6 C)  TempSrc: Oral Oral  Oral  Resp:   23   Height:      Weight:      SpO2:   100%     Intake/Output Summary (Last 24 hours) at 01/22/12 1646 Last data filed at 01/22/12 1300  Gross per 24 hour  Intake    680 ml  Output   2350 ml  Net  -1670 ml    Exam:   General:  Alert, no distress, oriented x 3  Cardiovascular: RRR, murmur 3/6  Respiratory:  B/l basilar crackles  Abdomen: soft, mild tenderness in lower abdomen, bs +  Ext: no c/c/e  Data Reviewed: Basic Metabolic Panel:  Lab 01/22/12 1478 01/21/12 1958 01/21/12 1320  NA 142 -- 145  K 4.3 -- 4.3  CL 101 -- 108  CO2 31 -- 27  GLUCOSE 132* -- 129*  BUN 28* -- 31*  CREATININE 1.35* -- 1.69*  CALCIUM 9.8 -- 9.3  MG -- 2.0 --  PHOS -- -- --   Liver Function Tests: No results found for this basename: AST:5,ALT:5,ALKPHOS:5,BILITOT:5,PROT:5,ALBUMIN:5 in the last 168 hours No results found for this basename: LIPASE:5,AMYLASE:5 in the last 168 hours No results found for this basename: AMMONIA:5 in the last 168 hours CBC:  Lab 01/22/12 0645 01/21/12 1320  WBC 6.6 4.5  NEUTROABS -- 3.7  HGB 12.2 11.7*  HCT 40.0 38.0  MCV 90.5 93.6  PLT 155 167   Cardiac Enzymes:  Lab 01/22/12 0550 01/21/12 2228 01/21/12 1629  CKTOTAL 55 46 53  CKMB 3.1 2.5 2.5  CKMBINDEX -- -- --  TROPONINI <0.30 <0.30 <0.30   BNP (last 3 results)  Basename 01/22/12 0550 01/21/12 1320 08/24/11 1455  PROBNP 28193.0* 12008.0* 1104.0*   CBG: No results found for this basename: GLUCAP:5 in the last 168 hours  Recent Results (from the past 240 hour(s))  URINE CULTURE     Status: Normal   Collection Time   01/21/12  5:11 PM      Component Value Range Status Comment   Specimen Description URINE, CLEAN CATCH   Final    Special Requests NONE   Final    Culture  Setup Time 01/21/2012 19:03   Final    Colony Count NO GROWTH   Final    Culture NO GROWTH   Final    Report Status  01/22/2012 FINAL   Final   MRSA PCR SCREENING     Status: Normal   Collection Time   01/21/12  7:29 PM      Component Value Range Status Comment   MRSA by PCR NEGATIVE  NEGATIVE Final      Studies: Dg Chest 2 View  01/21/2012  *RADIOLOGY REPORT*  Clinical Data: 78 year old female shortness of breath and respiratory distress.  CHEST - 2 VIEW  Comparison: 12/31/2011 and prior chest radiographs  Findings: Cardiomegaly is again noted. This is a low-volume film with pointy vascular congestion, small bilateral pleural effusions and interstitial opacities - likely representing pulmonary edema. Bibasilar opacities are present likely representing atelectasis but superimposed pneumonia is difficult to exclude. There is no evidence of pneumothorax or acute bony abnormality.  IMPRESSION: Low-volume film with pulmonary vascular congestion, probable interstitial pulmonary edema, small bilateral pleural effusions and bibasilar atelectasis.  Superimposed pneumonia is difficult to exclude.   Original Report Authenticated By: Harmon Pier, M.D.    Dg Chest Port 1 View  01/22/2012  *RADIOLOGY REPORT*  Clinical Data: CHF.  PORTABLE CHEST - 1 VIEW  Comparison: 01/21/2012  Findings: Cardiomegaly.  Low lung volumes with bilateral airspace disease and probable layering effusions.  No acute bony abnormality.  No change.  IMPRESSION: No significant change.   Original Report Authenticated By: Charlett Nose, M.D.     Scheduled Meds:    . amLODipine  5 mg Oral Daily  . aspirin EC  81 mg Oral Daily  . [COMPLETED] azithromycin (ZITHROMAX) 500 MG IVPB  500 mg Intravenous Once  . azithromycin  500 mg Oral Daily  . calcium carbonate  1 tablet Oral BID  . carvedilol  12.5 mg Oral BID WC  . [COMPLETED] cefTRIAXone (ROCEPHIN)  IV  1 g Intravenous Once  . dextromethorphan  30 mg Oral BID  . Ensure Plus  237 mL Oral Daily  . furosemide  40 mg Intravenous Q12H  . guaiFENesin  600 mg Oral BID  . isosorbide dinitrate  10 mg Oral BID   . levothyroxine  75 mcg Oral QAC breakfast  . nitroGLYCERIN  0.4 mg Sublingual Once  . pantoprazole  40 mg Oral Daily  . potassium chloride  10 mEq Oral Daily  . sodium chloride  3 mL Intravenous Q12H  . temazepam  30 mg Oral QHS  . [DISCONTINUED] albuterol  10 mg/hr Nebulization Once  . [DISCONTINUED] furosemide  40 mg Intravenous Q12H  . [DISCONTINUED] guaiFENesin-dextromethorphan  5 mL Oral TID  . [DISCONTINUED] Melatonin  6 mg Oral QHS   Continuous Infusions:   ________________________________________________________________________  Time spent: 35 min    [facility]  [facility] Pager [phone] If 8PM-8AM, please contact night-coverage  at [email or web address], password [facility] 01/22/2012, 4:46 PM  LOS: 1 day

## 2012-01-22 NOTE — Progress Notes (Signed)
Patient ID: [name], female   DOB: [birth year], 78 y.o.   MRN: [id number]    SUBJECTIVE: Breathing better this morning.  BP still high but coming down.      . [COMPLETED] albuterol      . aspirin EC  81 mg Oral Daily  . [COMPLETED] azithromycin (ZITHROMAX) 500 MG IVPB  500 mg Intravenous Once  . calcium carbonate  1 tablet Oral BID  . carvedilol  12.5 mg Oral BID WC  . [COMPLETED] cefTRIAXone (ROCEPHIN)  IV  1 g Intravenous Once  . Ensure Plus  237 mL Oral Daily  . [COMPLETED] furosemide  40 mg Intravenous Once  . furosemide  40 mg Intravenous Q12H  . [COMPLETED] ipratropium      . isosorbide dinitrate  10 mg Oral BID  . levothyroxine  75 mcg Oral QAC breakfast  . nitroGLYCERIN  0.4 mg Sublingual Once  . pantoprazole  40 mg Oral Daily  . potassium chloride  10 mEq Oral Daily  . sodium chloride  3 mL Intravenous Q12H  . temazepam  30 mg Oral QHS  . [DISCONTINUED] albuterol  10 mg/hr Nebulization Once  . [DISCONTINUED] carvedilol  12.5 mg Oral BID WC  . [DISCONTINUED] furosemide  40 mg Intravenous Q12H  . [DISCONTINUED] isosorbide dinitrate  10 mg Oral BID  . [DISCONTINUED] Melatonin  6 mg Oral QHS      Filed Vitals:   01/22/12 0400 01/22/12 0500 01/22/12 0715 01/22/12 0716  BP: 115/36 153/60 188/78   Pulse: 76 76 81   Temp: 97.5 F (36.4 C)   97.4 F (36.3 C)  TempSrc: Oral   Oral  Resp: 23 20 21    Height:      Weight:  140 lb 3.4 oz (63.6 kg)    SpO2: 98% 98% 100%     Intake/Output Summary (Last 24 hours) at 01/22/12 1019 Last data filed at 01/22/12 0900  Gross per 24 hour  Intake    440 ml  Output   2050 ml  Net  -1610 ml    LABS: Basic Metabolic Panel:  Basename 01/22/12 0645 01/21/12 1958 01/21/12 1320  NA 142 -- 145  K 4.3 -- 4.3  CL 101 -- 108  CO2 31 -- 27  GLUCOSE 132* -- 129*  BUN 28* -- 31*  CREATININE 1.35* -- 1.69*  CALCIUM 9.8 -- 9.3  MG -- 2.0 --  PHOS -- -- --   Liver Function Tests: No results found for this basename:  AST:2,ALT:2,ALKPHOS:2,BILITOT:2,PROT:2,ALBUMIN:2 in the last 72 hours No results found for this basename: LIPASE:2,AMYLASE:2 in the last 72 hours CBC:  Basename 01/22/12 0645 01/21/12 1320  WBC 6.6 4.5  NEUTROABS -- 3.7  HGB 12.2 11.7*  HCT 40.0 38.0  MCV 90.5 93.6  PLT 155 167   Cardiac Enzymes:  Basename 01/22/12 0550 01/21/12 2228 01/21/12 1629  CKTOTAL 55 46 53  CKMB 3.1 2.5 2.5  CKMBINDEX -- -- --  TROPONINI <0.30 <0.30 <0.30   BNP: No components found with this basename: POCBNP:3 D-Dimer: No results found for this basename: DDIMER:2 in the last 72 hours Hemoglobin A1C: No results found for this basename: HGBA1C in the last 72 hours Fasting Lipid Panel:  Basename 01/22/12 0645  CHOL 168  HDL 76  LDLCALC 81  TRIG 56  CHOLHDL 2.2  LDLDIRECT --   Thyroid Function Tests:  Basename 01/21/12 1958  TSH 1.515  T4TOTAL --  T3FREE --  THYROIDAB --   Anemia Panel: No results found  for this basename: VITAMINB12,FOLATE,FERRITIN,TIBC,IRON,RETICCTPCT in the last 72 hours  RADIOLOGY: Dg Chest 2 View  01/21/2012  *RADIOLOGY REPORT*  Clinical Data: 78 year old female shortness of breath and respiratory distress.  CHEST - 2 VIEW  Comparison: 12/31/2011 and prior chest radiographs  Findings: Cardiomegaly is again noted. This is a low-volume film with pointy vascular congestion, small bilateral pleural effusions and interstitial opacities - likely representing pulmonary edema. Bibasilar opacities are present likely representing atelectasis but superimposed pneumonia is difficult to exclude. There is no evidence of pneumothorax or acute bony abnormality.  IMPRESSION: Low-volume film with pulmonary vascular congestion, probable interstitial pulmonary edema, small bilateral pleural effusions and bibasilar atelectasis.  Superimposed pneumonia is difficult to exclude.   Original Report Authenticated By: Harmon Pier, M.D.    Dg Chest Port 1 View  01/22/2012  *RADIOLOGY REPORT*  Clinical  Data: CHF.  PORTABLE CHEST - 1 VIEW  Comparison: 01/21/2012  Findings: Cardiomegaly.  Low lung volumes with bilateral airspace disease and probable layering effusions.  No acute bony abnormality.  No change.  IMPRESSION: No significant change.   Original Report Authenticated By: Charlett Nose, M.D.     PHYSICAL EXAM General: NAD Neck: JVP difficult, no thyromegaly or thyroid nodule.  Lungs: Crackles 1/2 up lung fields bilaterally CV: Nondisplaced PMI.  Heart regular S1/S2, no S3/S4, 3/6 HSM LLSB.  No peripheral edema.   Abdomen: Soft, nontender, no hepatosplenomegaly, no distention.  Neurologic: Alert and oriented x 3.  Psych: Normal affect. Extremities: No clubbing or cyanosis.   TELEMETRY: Reviewed telemetry pt in NSR  ASSESSMENT AND PLAN: 78 yo with history of CAD, primarily diastolic CHF (EF 47-82%) with pulmonary HTN, and CKD presented with dyspnea and hypertensive emergency with pulmonary edema.  1. CHF: Acute on chronic primarily diastolic CHF.  Her Lasix dose had been cut back recently.  EF 45-50% with pulmonary HTN on recent echo.  She had pulmonary edema on CXR.  She has been diuresing reasonably well on Lasix 40 mg IV bid. Lungs still sound congested.  JVP difficult to assess.  BNP very high (but also elderly with abnormal creatinine).  - Continue current IV Lasix. - Control BP.  2. Pulmonary: Suspect dyspnea is secondary to pulmonary edema.  Doubt PNA => No fever, WBCs.  3. HTN: Better BP this am.  Continue Coreg, will add amlodipine 5 mg daily.  4. CKD: Creatinine lower this morning.  Follow closely with diuresis.   Marca Ancona 01/22/2012 10:24 AM

## 2012-01-23 DIAGNOSIS — E039 Hypothyroidism, unspecified: Secondary | ICD-10-CM

## 2012-01-23 LAB — BASIC METABOLIC PANEL
BUN: 30 mg/dL — ABNORMAL HIGH (ref 6–23)
Chloride: 99 mEq/L (ref 96–112)
GFR calc Af Amer: 35 mL/min — ABNORMAL LOW (ref >90)
Glucose, Bld: 87 mg/dL (ref 70–99)
Potassium: 3.8 mEq/L (ref 3.5–5.1)
Sodium: 144 mEq/L (ref 135–145)

## 2012-01-23 LAB — GLUCOSE, CAPILLARY: Glucose-Capillary: 92 mg/dL (ref 70–99)

## 2012-01-23 MED ORDER — POLYVINYL ALCOHOL 1.4 % OP SOLN
1.0000 [drp] | OPHTHALMIC | Status: DC | PRN
  Administered 2012-01-23: 1 [drp] via OPHTHALMIC
  Filled 2012-01-23: qty 15

## 2012-01-23 MED ORDER — FUROSEMIDE 40 MG PO TABS
40.0000 mg | ORAL_TABLET | Freq: Two times a day (BID) | ORAL | Status: DC
  Administered 2012-01-23 – 2012-01-24 (×3): 40 mg via ORAL
  Filled 2012-01-23 (×6): qty 1

## 2012-01-23 MED ORDER — EYE WASH OPHTH SOLN: 1.0000 [drp] | OPHTHALMIC | Status: DC | PRN

## 2012-01-23 MED ORDER — TEMAZEPAM 15 MG PO CAPS
30.0000 mg | ORAL_CAPSULE | Freq: Every day | ORAL | Status: DC
  Administered 2012-01-23 – 2012-01-27 (×5): 30 mg via ORAL
  Filled 2012-01-23 (×5): qty 2

## 2012-01-23 NOTE — Progress Notes (Signed)
Called into room by patient's daughter. She was worried because her mother was "sleepy". Patient was arousable, vitals were stable and cbg 92.  Daughter did state that they had removed O2 for patient's comfort when preparing for sleep.  With o2 reapplied, spo2 was 96-98 % on 2L.  Connected patient to continuous Spo2 monitoring with o2 at 2l via nasal cannula.  Patient had received 30mg  po Restoril as ordered earlier.  Patient denies any discomfort, stating only that she is tired. Will continue to monitor. BP 102/47 Pulse 72 Spo2 97% 2L  Dianna Limbo M

## 2012-01-23 NOTE — Progress Notes (Signed)
[facility] PROGRESS NOTE  [name]:[id number] DOB: 1915/09/27 DOA: 01/21/2012 PCP: [name], MD  Brief narrative: [name] is a 78 y.o. female with past medical history of hypertension, questionable hyperlipidemia, chronic kidney disease, hypothyroidism, history of CHF who presents to the ED with a 2-3 week history of worsening shortness of breath on exertion. Patient's daughter is at bedside stated that symptoms began on 12/31/2011 with shortness of breath. Patient presented to PCPs office and was being treated for bronchitis. Per family patient's shortness of breath has been worsening since then to the point whereby on the day of admission patient went to the bathroom and unable to get off the toilet secondary to worsening shortness of breath and generalized weakness. The patient's daughters also state the patient was coughing up yellow sputum for a couple of days   Assessment/Plan: Principal Problem:  *Acute respiratory distress  Acute exacerbation of CHF (congestive heart failure) Diuresis per Cardiology Pt did not take her Lasix for about 2 wks per history obtained by cardiology   Malignant hypertension Coming under control- Norvasc added by cards   Bronchitis? Cough with yellow sputum continues- cont zithromax for total of 3 days  Sleepy Due to delsym? Will d/c. Also change timing on Temazepam as she takes it at 7 at home   HYPOTHYROIDISM tsh stable   CORONARY ARTERY DISEASE   GERD   CKD (chronic kidney disease) 3 Cr rising- Lasix cut back by cards   Code Status: full Family Communication: daughter in room Disposition Plan: telem DVT prophylaxis:scds   Consultants:  [place] cards  Procedures:  none  Antibiotics: Rocephin 12/6 Zithromax 12/6 >>  HPI/Subjective: Pt sleepy- received temazepam later than she takes at home- daughter asking to adjust dose to 7 pm. Also she thinks pt's antibiotic causing sleepiness- I have  explained that it is likely the Delsym and have d/c'd it- daughter agrees- pt otherwise feels well and has ambulated to the commode without problems.   Objective: Filed Vitals:   01/23/12 0500 01/23/12 0719 01/23/12 0900 01/23/12 1430  BP:   169/76 103/46  Pulse:    79  Temp:  97.2 F (36.2 C)  98.4 F (36.9 C)  TempSrc:  Oral  Oral  Resp:    18  Height:      Weight: 59.9 kg (132 lb 0.9 oz)     SpO2:    93%    Intake/Output Summary (Last 24 hours) at 01/23/12 1839 Last data filed at 01/23/12 1000  Gross per 24 hour  Intake    120 ml  Output   1650 ml  Net  -1530 ml    Exam:   General:  Alert, no distress, oriented x 3  Cardiovascular: RRR, murmur 3/6  Respiratory:  Mild B/l basilar crackles  Abdomen: soft, mild tenderness in lower abdomen, bs +  Ext: no c/c/e  Data Reviewed: Basic Metabolic Panel:  Lab 01/23/12 8119 01/22/12 0645 01/21/12 1958 01/21/12 1320  NA 144 142 -- 145  K 3.8 4.3 -- 4.3  CL 99 101 -- 108  CO2 36* 31 -- 27  GLUCOSE 87 132* -- 129*  BUN 30* 28* -- 31*  CREATININE 1.44* 1.35* -- 1.69*  CALCIUM 9.2 9.8 -- 9.3  MG -- -- 2.0 --  PHOS -- -- -- --   Liver Function Tests: No results found for this basename: AST:5,ALT:5,ALKPHOS:5,BILITOT:5,PROT:5,ALBUMIN:5 in the last 168 hours No results found for this basename: LIPASE:5,AMYLASE:5 in the last 168 hours No results  found for this basename: AMMONIA:5 in the last 168 hours CBC:  Lab 01/22/12 0645 01/21/12 1320  WBC 6.6 4.5  NEUTROABS -- 3.7  HGB 12.2 11.7*  HCT 40.0 38.0  MCV 90.5 93.6  PLT 155 167   Cardiac Enzymes:  Lab 01/22/12 0550 01/21/12 2228 01/21/12 1629  CKTOTAL 55 46 53  CKMB 3.1 2.5 2.5  CKMBINDEX -- -- --  TROPONINI <0.30 <0.30 <0.30   BNP (last 3 results)  Basename 01/22/12 0550 01/21/12 1320 08/24/11 1455  PROBNP 28193.0* 12008.0* 1104.0*   CBG: No results found for this basename: GLUCAP:5 in the last 168 hours  Recent Results (from the past 240 hour(s))   URINE CULTURE     Status: Normal   Collection Time   01/21/12  5:11 PM      Component Value Range Status Comment   Specimen Description URINE, CLEAN CATCH   Final    Special Requests NONE   Final    Culture  Setup Time 01/21/2012 19:03   Final    Colony Count NO GROWTH   Final    Culture NO GROWTH   Final    Report Status 01/22/2012 FINAL   Final   MRSA PCR SCREENING     Status: Normal   Collection Time   01/21/12  7:29 PM      Component Value Range Status Comment   MRSA by PCR NEGATIVE  NEGATIVE Final      Studies: Dg Chest 2 View  01/21/2012  *RADIOLOGY REPORT*  Clinical Data: 78 year old female shortness of breath and respiratory distress.  CHEST - 2 VIEW  Comparison: 12/31/2011 and prior chest radiographs  Findings: Cardiomegaly is again noted. This is a low-volume film with pointy vascular congestion, small bilateral pleural effusions and interstitial opacities - likely representing pulmonary edema. Bibasilar opacities are present likely representing atelectasis but superimposed pneumonia is difficult to exclude. There is no evidence of pneumothorax or acute bony abnormality.  IMPRESSION: Low-volume film with pulmonary vascular congestion, probable interstitial pulmonary edema, small bilateral pleural effusions and bibasilar atelectasis.  Superimposed pneumonia is difficult to exclude.   Original Report Authenticated By: Harmon Pier, M.D.    Dg Chest Port 1 View  01/22/2012  *RADIOLOGY REPORT*  Clinical Data: CHF.  PORTABLE CHEST - 1 VIEW  Comparison: 01/21/2012  Findings: Cardiomegaly.  Low lung volumes with bilateral airspace disease and probable layering effusions.  No acute bony abnormality.  No change.  IMPRESSION: No significant change.   Original Report Authenticated By: Charlett Nose, M.D.     Scheduled Meds:    . amLODipine  5 mg Oral Daily  . aspirin EC  81 mg Oral Daily  . [COMPLETED] azithromycin  500 mg Oral Daily  . calcium carbonate  1 tablet Oral BID  . carvedilol   12.5 mg Oral BID WC  . Ensure Plus  237 mL Oral Daily  . furosemide  40 mg Oral BID  . guaiFENesin  600 mg Oral BID  . isosorbide dinitrate  10 mg Oral BID  . levothyroxine  75 mcg Oral QAC breakfast  . nitroGLYCERIN  0.4 mg Sublingual Once  . pantoprazole  40 mg Oral Daily  . potassium chloride  10 mEq Oral Daily  . sodium chloride  3 mL Intravenous Q12H  . temazepam  30 mg Oral QHS  . [DISCONTINUED] dextromethorphan  30 mg Oral BID  . [DISCONTINUED] furosemide  40 mg Intravenous Q12H  . [DISCONTINUED] temazepam  30 mg Oral QHS   Continuous  Infusions:   ________________________________________________________________________  Time spent: 35 min    [facility]  [facility] Pager [phone] If 8PM-8AM, please contact night-coverage at [email or web address], password [facility] 01/23/2012, 6:39 PM  LOS: 2 days

## 2012-01-23 NOTE — Progress Notes (Signed)
Notified MD for transfer orders.  Pt Vs stable.  No problems throughout night.  Daughter at bedside.  New orders received. Will continue to monitor. Emilie [place]

## 2012-01-23 NOTE — Progress Notes (Signed)
Patient ID: [name], female   DOB: 06-12-1915, 78 y.o.   MRN: [id number]     SUBJECTIVE: Breathing better.  A little groggy and shaky when she stood up.      Marland Kitchen amLODipine  5 mg Oral Daily  . aspirin EC  81 mg Oral Daily  . [COMPLETED] azithromycin  500 mg Oral Daily  . calcium carbonate  1 tablet Oral BID  . carvedilol  12.5 mg Oral BID WC  . dextromethorphan  30 mg Oral BID  . Ensure Plus  237 mL Oral Daily  . furosemide  40 mg Oral BID  . guaiFENesin  600 mg Oral BID  . isosorbide dinitrate  10 mg Oral BID  . levothyroxine  75 mcg Oral QAC breakfast  . nitroGLYCERIN  0.4 mg Sublingual Once  . pantoprazole  40 mg Oral Daily  . potassium chloride  10 mEq Oral Daily  . sodium chloride  3 mL Intravenous Q12H  . temazepam  30 mg Oral QHS  . [DISCONTINUED] furosemide  40 mg Intravenous Q12H  . [DISCONTINUED] guaiFENesin-dextromethorphan  5 mL Oral TID      Filed Vitals:   01/23/12 0345 01/23/12 0500 01/23/12 0719 01/23/12 0900  BP:    169/76  Pulse: 65     Temp:   97.2 F (36.2 C)   TempSrc:   Oral   Resp: 22     Height:      Weight:  132 lb 0.9 oz (59.9 kg)    SpO2: 99%       Intake/Output Summary (Last 24 hours) at 01/23/12 1157 Last data filed at 01/23/12 1000  Gross per 24 hour  Intake    480 ml  Output   2450 ml  Net  -1970 ml    LABS: Basic Metabolic Panel:  Basename 01/23/12 0558 01/22/12 0645 01/21/12 1958  NA 144 142 --  K 3.8 4.3 --  CL 99 101 --  CO2 36* 31 --  GLUCOSE 87 132* --  BUN 30* 28* --  CREATININE 1.44* 1.35* --  CALCIUM 9.2 9.8 --  MG -- -- 2.0  PHOS -- -- --   Liver Function Tests: No results found for this basename: AST:2,ALT:2,ALKPHOS:2,BILITOT:2,PROT:2,ALBUMIN:2 in the last 72 hours No results found for this basename: LIPASE:2,AMYLASE:2 in the last 72 hours CBC:  Basename 01/22/12 0645 01/21/12 1320  WBC 6.6 4.5  NEUTROABS -- 3.7  HGB 12.2 11.7*  HCT 40.0 38.0  MCV 90.5 93.6  PLT 155 167   Cardiac  Enzymes:  Basename 01/22/12 0550 01/21/12 2228 01/21/12 1629  CKTOTAL 55 46 53  CKMB 3.1 2.5 2.5  CKMBINDEX -- -- --  TROPONINI <0.30 <0.30 <0.30   BNP: No components found with this basename: POCBNP:3 D-Dimer: No results found for this basename: DDIMER:2 in the last 72 hours Hemoglobin A1C: No results found for this basename: HGBA1C in the last 72 hours Fasting Lipid Panel:  Basename 01/22/12 0645  CHOL 168  HDL 76  LDLCALC 81  TRIG 56  CHOLHDL 2.2  LDLDIRECT --   Thyroid Function Tests:  Basename 01/21/12 1958  TSH 1.515  T4TOTAL --  T3FREE --  THYROIDAB --   Anemia Panel: No results found for this basename: VITAMINB12,FOLATE,FERRITIN,TIBC,IRON,RETICCTPCT in the last 72 hours  RADIOLOGY: Dg Chest 2 View  01/21/2012  *RADIOLOGY REPORT*  Clinical Data: 78 year old female shortness of breath and respiratory distress.  CHEST - 2 VIEW  Comparison: 12/31/2011 and prior chest radiographs  Findings: Cardiomegaly is  again noted. This is a low-volume film with pointy vascular congestion, small bilateral pleural effusions and interstitial opacities - likely representing pulmonary edema. Bibasilar opacities are present likely representing atelectasis but superimposed pneumonia is difficult to exclude. There is no evidence of pneumothorax or acute bony abnormality.  IMPRESSION: Low-volume film with pulmonary vascular congestion, probable interstitial pulmonary edema, small bilateral pleural effusions and bibasilar atelectasis.  Superimposed pneumonia is difficult to exclude.   Original Report Authenticated By: Harmon Pier, M.D.    Dg Chest Port 1 View  01/22/2012  *RADIOLOGY REPORT*  Clinical Data: CHF.  PORTABLE CHEST - 1 VIEW  Comparison: 01/21/2012  Findings: Cardiomegaly.  Low lung volumes with bilateral airspace disease and probable layering effusions.  No acute bony abnormality.  No change.  IMPRESSION: No significant change.   Original Report Authenticated By: Charlett Nose, M.D.      PHYSICAL EXAM General: NAD Neck: JVP not elevated, no thyromegaly or thyroid nodule.  Lungs: Crackles at bases bilaterally.  CV: Nondisplaced PMI.  Heart regular S1/S2, no S3/S4, 3/6 HSM LLSB.  No peripheral edema.   Abdomen: Soft, nontender, no hepatosplenomegaly, no distention.  Neurologic: Alert and oriented x 3.  Psych: Normal affect. Extremities: No clubbing or cyanosis.   TELEMETRY: Reviewed telemetry pt in NSR  ASSESSMENT AND PLAN: 78 yo with history of CAD, primarily diastolic CHF (EF 16-10%) with pulmonary HTN, and CKD presented with dyspnea and hypertensive emergency with pulmonary edema.  1. CHF: Acute on chronic primarily diastolic CHF.  EF 45-50% with pulmonary HTN on recent echo.  Volume status looks better, creatinine starting to rise.  Will stop IV Lasix and start Lasix 40 mg po bid.  2. Pulmonary: Suspect dyspnea is secondary to pulmonary edema.  Doubt PNA => No fever, WBCs.  3. HTN: BP better, continue current meds.  4. CKD: Creatinine a bit higher, changing Lasix to po.  5. Needs to get out of bed/ambulate.  Marca Ancona 01/23/2012 11:57 AM

## 2012-01-24 DIAGNOSIS — N189 Chronic kidney disease, unspecified: Secondary | ICD-10-CM

## 2012-01-24 LAB — BASIC METABOLIC PANEL
BUN: 28 mg/dL — ABNORMAL HIGH (ref 6–23)
CO2: 39 mEq/L — ABNORMAL HIGH (ref 19–32)
Chloride: 95 mEq/L — ABNORMAL LOW (ref 96–112)
Creatinine, Ser: 1.38 mg/dL — ABNORMAL HIGH (ref 0.50–1.10)
GFR calc Af Amer: 37 mL/min — ABNORMAL LOW (ref >90)
Potassium: 3.3 mEq/L — ABNORMAL LOW (ref 3.5–5.1)

## 2012-01-24 MED ORDER — POTASSIUM CHLORIDE CRYS ER 20 MEQ PO TBCR
40.0000 meq | EXTENDED_RELEASE_TABLET | Freq: Two times a day (BID) | ORAL | Status: DC
  Administered 2012-01-24 – 2012-01-26 (×6): 40 meq via ORAL
  Filled 2012-01-24 (×8): qty 2

## 2012-01-24 MED ORDER — AMLODIPINE BESYLATE 10 MG PO TABS
10.0000 mg | ORAL_TABLET | Freq: Every day | ORAL | Status: DC
  Administered 2012-01-24 – 2012-01-26 (×3): 10 mg via ORAL
  Filled 2012-01-24 (×4): qty 1

## 2012-01-24 NOTE — Progress Notes (Signed)
Chaplain visited patient during a normal Unit routine rounds. Patient and family were in the room. Chaplain provided ministry of presence and empathic listening. Chaplain shared words of hope and encouragement with both patient and family. Chaplain will continue to follow-up as needed. Patient and family expressed their appreciation for Chaplain's visit and support.

## 2012-01-24 NOTE — Progress Notes (Signed)
Patient ID: [name], female   DOB: 11/24/1915, 78 y.o.   MRN: [id number]     SUBJECTIVE: Breathing better.      Marland Kitchen amLODipine  5 mg Oral Daily  . aspirin EC  81 mg Oral Daily  . [COMPLETED] azithromycin  500 mg Oral Daily  . calcium carbonate  1 tablet Oral BID  . carvedilol  12.5 mg Oral BID WC  . Ensure Plus  237 mL Oral Daily  . furosemide  40 mg Oral BID  . guaiFENesin  600 mg Oral BID  . isosorbide dinitrate  10 mg Oral BID  . levothyroxine  75 mcg Oral QAC breakfast  . nitroGLYCERIN  0.4 mg Sublingual Once  . pantoprazole  40 mg Oral Daily  . potassium chloride  10 mEq Oral Daily  . sodium chloride  3 mL Intravenous Q12H  . temazepam  30 mg Oral QHS  . [DISCONTINUED] dextromethorphan  30 mg Oral BID  . [DISCONTINUED] furosemide  40 mg Intravenous Q12H  . [DISCONTINUED] temazepam  30 mg Oral QHS      Filed Vitals:   01/23/12 0900 01/23/12 1430 01/23/12 2017 01/24/12 0600  BP: 169/76 103/46 110/71 166/78  Pulse:  79 65 81  Temp:  98.4 F (36.9 C) 99.2 F (37.3 C) 98.6 F (37 C)  TempSrc:  Oral Oral Oral  Resp:  18 19 19   Height:      Weight:    125 lb (56.7 kg)  SpO2:  93% 100% 100%    Intake/Output Summary (Last 24 hours) at 01/24/12 0653 Last data filed at 01/24/12 0500  Gross per 24 hour  Intake    600 ml  Output   1701 ml  Net  -1101 ml    LABS: Basic Metabolic Panel:  Basename 01/23/12 0558 01/22/12 0645 01/21/12 1958  NA 144 142 --  K 3.8 4.3 --  CL 99 101 --  CO2 36* 31 --  GLUCOSE 87 132* --  BUN 30* 28* --  CREATININE 1.44* 1.35* --  CALCIUM 9.2 9.8 --  MG -- -- 2.0  PHOS -- -- --   Liver Function Tests: No results found for this basename: AST:2,ALT:2,ALKPHOS:2,BILITOT:2,PROT:2,ALBUMIN:2 in the last 72 hours No results found for this basename: LIPASE:2,AMYLASE:2 in the last 72 hours CBC:  Basename 01/22/12 0645 01/21/12 1320  WBC 6.6 4.5  NEUTROABS -- 3.7  HGB 12.2 11.7*  HCT 40.0 38.0  MCV 90.5 93.6  PLT 155 167    Cardiac Enzymes:  Basename 01/22/12 0550 01/21/12 2228 01/21/12 1629  CKTOTAL 55 46 53  CKMB 3.1 2.5 2.5  CKMBINDEX -- -- --  TROPONINI <0.30 <0.30 <0.30   BNP: No components found with this basename: POCBNP:3 D-Dimer: No results found for this basename: DDIMER:2 in the last 72 hours Hemoglobin A1C: No results found for this basename: HGBA1C in the last 72 hours Fasting Lipid Panel:  Basename 01/22/12 0645  CHOL 168  HDL 76  LDLCALC 81  TRIG 56  CHOLHDL 2.2  LDLDIRECT --   Thyroid Function Tests:  Basename 01/21/12 1958  TSH 1.515  T4TOTAL --  T3FREE --  THYROIDAB --   Anemia Panel: No results found for this basename: VITAMINB12,FOLATE,FERRITIN,TIBC,IRON,RETICCTPCT in the last 72 hours  RADIOLOGY: Dg Chest 2 View  01/21/2012  *RADIOLOGY REPORT*  Clinical Data: 78 year old female shortness of breath and respiratory distress.  CHEST - 2 VIEW  Comparison: 12/31/2011 and prior chest radiographs  Findings: Cardiomegaly is again noted. This is a  low-volume film with pointy vascular congestion, small bilateral pleural effusions and interstitial opacities - likely representing pulmonary edema. Bibasilar opacities are present likely representing atelectasis but superimposed pneumonia is difficult to exclude. There is no evidence of pneumothorax or acute bony abnormality.  IMPRESSION: Low-volume film with pulmonary vascular congestion, probable interstitial pulmonary edema, small bilateral pleural effusions and bibasilar atelectasis.  Superimposed pneumonia is difficult to exclude.   Original Report Authenticated By: Harmon Pier, M.D.    Dg Chest Port 1 View  01/22/2012  *RADIOLOGY REPORT*  Clinical Data: CHF.  PORTABLE CHEST - 1 VIEW  Comparison: 01/21/2012  Findings: Cardiomegaly.  Low lung volumes with bilateral airspace disease and probable layering effusions.  No acute bony abnormality.  No change.  IMPRESSION: No significant change.   Original Report Authenticated By: Charlett Nose, M.D.     PHYSICAL EXAM General: NAD Neck: JVP not elevated, no thyromegaly or thyroid nodule.  Lungs: Crackles at bases bilaterally.  CV: Nondisplaced PMI.  Heart regular S1/S2, no S3/S4, 3/6 HSM LLSB.  No peripheral edema.   Abdomen: Soft, nontender, no hepatosplenomegaly, no distention.  Neurologic: Alert and oriented x 3.  Psych: Normal affect. Extremities: No clubbing or cyanosis.   TELEMETRY: Reviewed telemetry pt in NSR  ASSESSMENT AND PLAN: 78 yo with history of CAD, primarily diastolic CHF (EF 40-98%) with pulmonary HTN, and CKD presented with dyspnea and hypertensive emergency with pulmonary edema.  1. CHF: Acute on chronic primarily diastolic CHF.  EF 45-50% with pulmonary HTN on recent echo.  Volume status looks better.  She may not have been taking her Lasix at home for the 2 weeks prior to admission.  2. Pulmonary: Suspect dyspnea is secondary to pulmonary edema.  She continues to improve.   3. HTN: BP better, continue current meds.  4. CKD: creatinine looks stable. Further evaluation and management per medicine team. 5. Needs to get out of bed/ambulate.  [name]. 01/24/2012 6:53 AM

## 2012-01-24 NOTE — Progress Notes (Addendum)
[facility] PROGRESS NOTE  KIMARIA STRUTHERS ZOX:[id number] DOB: [birth year] DOA: 01/21/2012 PCP: [name], MD  Brief narrative: [name] is a 78 y.o. female with past medical history of hypertension, questionable hyperlipidemia, chronic kidney disease, hypothyroidism, history of CHF who presents to the ED with a 2-3 week history of worsening shortness of breath on exertion. Patient's daughter is at bedside stated that symptoms began on 12/31/2011 with shortness of breath. Patient presented to PCPs office and was being treated for bronchitis. Per family patient's shortness of breath has been worsening since then to the point whereby on the day of admission patient went to the bathroom and unable to get off the toilet secondary to worsening shortness of breath and generalized weakness. The patient's daughters also state the patient was coughing up yellow sputum for a couple of days   Assessment/Plan: Principal Problem:  *Acute respiratory distress  Acute on chronic exacerbation of Diastolic CHF (congestive heart failure) Diuresis per Cardiology, clinically improving, on PO lasix now,  Weight down 16lbs, 3.8L negative, check BNP in am Pt did not take her Lasix for about 2 wks prior to admission   Malignant hypertension improved- increase norvasc   Bronchitis? Cough with yellow sputum continues- started on zithromax per Dr.Rizwan yesterday, continue this for 2 more days  Sleepy Due to delsym? Will d/c. Also change timing on Temazepam as she takes it at 7 at home   HYPOTHYROIDISM tsh stable   CORONARY ARTERY DISEASE   GERD   CKD (chronic kidney disease) 3 Cr rising- Lasix cut back by cards  Ambulate, PT/OT   Code Status: full Family Communication: daughter in room Disposition Plan: telem, home with [facility] services DVT prophylaxis:scds   Consultants:  [place] cards  Procedures:  none  Antibiotics: Rocephin 12/6 Zithromax 12/6  >>  HPI/Subjective: Feels better, breathing improved  Objective: Filed Vitals:   01/23/12 0900 01/23/12 1430 01/23/12 2017 01/24/12 0600  BP: 169/76 103/46 110/71 166/78  Pulse:  79 65 81  Temp:  98.4 F (36.9 C) 99.2 F (37.3 C) 98.6 F (37 C)  TempSrc:  Oral Oral Oral  Resp:  18 19 19   Height:      Weight:    56.7 kg (125 lb)  SpO2:  93% 100% 100%    Intake/Output Summary (Last 24 hours) at 01/24/12 0842 Last data filed at 01/24/12 0500  Gross per 24 hour  Intake    600 ml  Output   1701 ml  Net  -1101 ml    Exam:   General:  Alert, no distress, oriented x 3  Cardiovascular: RRR, murmur 3/6  Respiratory:   B/l basilar crackles  Abdomen: soft, mild tenderness in lower abdomen, bs +  Ext: no c/c/e  Data Reviewed: Basic Metabolic Panel:  Lab 01/24/12 8119 01/23/12 0558 01/22/12 0645 01/21/12 1958 01/21/12 1320  NA 142 144 142 -- 145  K 3.3* 3.8 4.3 -- 4.3  CL 95* 99 101 -- 108  CO2 39* 36* 31 -- 27  GLUCOSE 72 87 132* -- 129*  BUN 28* 30* 28* -- 31*  CREATININE 1.38* 1.44* 1.35* -- 1.69*  CALCIUM 9.2 9.2 9.8 -- 9.3  MG -- -- -- 2.0 --  PHOS -- -- -- -- --   Liver Function Tests: No results found for this basename: AST:5,ALT:5,ALKPHOS:5,BILITOT:5,PROT:5,ALBUMIN:5 in the last 168 hours No results found for this basename: LIPASE:5,AMYLASE:5 in the last 168 hours No results found for this basename: AMMONIA:5 in the last 168 hours CBC:  Lab 01/22/12 0645 01/21/12 1320  WBC 6.6 4.5  NEUTROABS -- 3.7  HGB 12.2 11.7*  HCT 40.0 38.0  MCV 90.5 93.6  PLT 155 167   Cardiac Enzymes:  Lab 01/22/12 0550 01/21/12 2228 01/21/12 1629  CKTOTAL 55 46 53  CKMB 3.1 2.5 2.5  CKMBINDEX -- -- --  TROPONINI <0.30 <0.30 <0.30   BNP (last 3 results)  Basename 01/22/12 0550 01/21/12 1320 08/24/11 1455  PROBNP 28193.0* 12008.0* 1104.0*   CBG:  Lab 01/23/12 2131  GLUCAP 92    Recent Results (from the past 240 hour(s))  URINE CULTURE     Status: Normal    Collection Time   01/21/12  5:11 PM      Component Value Range Status Comment   Specimen Description URINE, CLEAN CATCH   Final    Special Requests NONE   Final    Culture  Setup Time 01/21/2012 19:03   Final    Colony Count NO GROWTH   Final    Culture NO GROWTH   Final    Report Status 01/22/2012 FINAL   Final   MRSA PCR SCREENING     Status: Normal   Collection Time   01/21/12  7:29 PM      Component Value Range Status Comment   MRSA by PCR NEGATIVE  NEGATIVE Final      Studies: Dg Chest 2 View  01/21/2012  *RADIOLOGY REPORT*  Clinical Data: 78 year old female shortness of breath and respiratory distress.  CHEST - 2 VIEW  Comparison: 12/31/2011 and prior chest radiographs  Findings: Cardiomegaly is again noted. This is a low-volume film with pointy vascular congestion, small bilateral pleural effusions and interstitial opacities - likely representing pulmonary edema. Bibasilar opacities are present likely representing atelectasis but superimposed pneumonia is difficult to exclude. There is no evidence of pneumothorax or acute bony abnormality.  IMPRESSION: Low-volume film with pulmonary vascular congestion, probable interstitial pulmonary edema, small bilateral pleural effusions and bibasilar atelectasis.  Superimposed pneumonia is difficult to exclude.   Original Report Authenticated By: Harmon Pier, M.D.    Dg Chest Port 1 View  01/22/2012  *RADIOLOGY REPORT*  Clinical Data: CHF.  PORTABLE CHEST - 1 VIEW  Comparison: 01/21/2012  Findings: Cardiomegaly.  Low lung volumes with bilateral airspace disease and probable layering effusions.  No acute bony abnormality.  No change.  IMPRESSION: No significant change.   Original Report Authenticated By: Charlett Nose, M.D.     Scheduled Meds:    . amLODipine  10 mg Oral Daily  . aspirin EC  81 mg Oral Daily  . [COMPLETED] azithromycin  500 mg Oral Daily  . calcium carbonate  1 tablet Oral BID  . carvedilol  12.5 mg Oral BID WC  . Ensure Plus   237 mL Oral Daily  . furosemide  40 mg Oral BID  . guaiFENesin  600 mg Oral BID  . isosorbide dinitrate  10 mg Oral BID  . levothyroxine  75 mcg Oral QAC breakfast  . nitroGLYCERIN  0.4 mg Sublingual Once  . pantoprazole  40 mg Oral Daily  . potassium chloride  10 mEq Oral Daily  . sodium chloride  3 mL Intravenous Q12H  . temazepam  30 mg Oral QHS  . [DISCONTINUED] amLODipine  5 mg Oral Daily  . [DISCONTINUED] dextromethorphan  30 mg Oral BID  . [DISCONTINUED] furosemide  40 mg Intravenous Q12H  . [DISCONTINUED] temazepam  30 mg Oral QHS   Continuous Infusions:   ________________________________________________________________________  Time  spent: 35 min    Susann Lawhorne  [facility] Pager [phone] If 8PM-8AM, please contact night-coverage at [email or web address], password [facility] 01/24/2012, 8:42 AM  LOS: 3 days

## 2012-01-25 DIAGNOSIS — I5033 Acute on chronic diastolic (congestive) heart failure: Secondary | ICD-10-CM

## 2012-01-25 LAB — BASIC METABOLIC PANEL
Calcium: 9.2 mg/dL (ref 8.4–10.5)
GFR calc Af Amer: 37 mL/min — ABNORMAL LOW (ref >90)
GFR calc non Af Amer: 32 mL/min — ABNORMAL LOW (ref >90)
Glucose, Bld: 79 mg/dL (ref 70–99)
Potassium: 3.9 mEq/L (ref 3.5–5.1)
Sodium: 139 mEq/L (ref 135–145)

## 2012-01-25 LAB — PRO B NATRIURETIC PEPTIDE: Pro B Natriuretic peptide (BNP): 4459 pg/mL — ABNORMAL HIGH (ref 0–450)

## 2012-01-25 MED ORDER — HYDRALAZINE HCL 10 MG PO TABS
10.0000 mg | ORAL_TABLET | Freq: Three times a day (TID) | ORAL | Status: DC
  Administered 2012-01-25 – 2012-01-26 (×3): 10 mg via ORAL
  Filled 2012-01-25 (×6): qty 1

## 2012-01-25 MED ORDER — BOOST / RESOURCE BREEZE PO LIQD
1.0000 | Freq: Three times a day (TID) | ORAL | Status: DC
  Administered 2012-01-25 – 2012-01-28 (×7): 1 via ORAL

## 2012-01-25 NOTE — Progress Notes (Signed)
Chaplain visited patient as a follow -up. Patient and family were in the room. Patient was alert and responsive. Chaplain provided ministry presence and empathic listening. Chaplain will follow-up in the AM. Patient and family thanked Chaplain for the support.

## 2012-01-25 NOTE — Evaluation (Signed)
Physical Therapy Evaluation Patient Details Name: [name] MRN: [id number] DOB: [birth year] Today's Date: 01/25/2012 Time: 4098-1191 PT Time Calculation (min): 31 min  PT Assessment / Plan / Recommendation Clinical Impression  Pt. admitted with SOB, pulmonary edema, and CHF; Pt. would benefit from acute PT to address mobility and activity tolerance so pt. may return home safely. Pt. ambulated on room air and O2 above 92% throughout. Pt. very pleasant and eager to participate in mobility.    PT Assessment  Patient needs continued PT services    Follow Up Recommendations  Home health PT       Barriers to Discharge None      Equipment Recommendations  None recommended by PT       Frequency Min 3X/week    Precautions / Restrictions Precautions Precautions: Fall Precaution Comments: pt reports no falls in last year   Pertinent Vitals/Pain HR 80 sitting EOB; 103 at highest with ambulation O2 96% on room air sitting EOB; 92% at lowest during ambulation      Mobility  Bed Mobility Bed Mobility: Supine to Sit;Sitting - Scoot to Edge of Bed Supine to Sit: 6: Modified independent (Device/Increase time);HOB elevated;With rails (25 degrees) Sitting - Scoot to Edge of Bed: 6: Modified independent (Device/Increase time) Transfers Transfers: Sit to Stand;Stand to Sit Sit to Stand: 4: Min assist;From toilet;From bed;From chair/3-in-1 Stand to Sit: 5: Supervision;To toilet;To chair/3-in-1 Details for Transfer Assistance: Performed x3; Pt. required minA to stand for lower chair but able to stand supervision from toliet. Pt. required cueing to reach back for armrests of chair when sitting to control descent. Ambulation/Gait Ambulation/Gait Assistance: 4: Min guard Ambulation Distance (Feet): 150 Feet Assistive device: Rolling walker Ambulation/Gait Assistance Details: Pt. required cueing to stay inside RW, to stand upright, and to look up when ambulating.  Gait Pattern:  Step-through pattern;Decreased stride length;Trunk flexed Stairs: No           PT Diagnosis: Difficulty walking  PT Problem List: Decreased activity tolerance;Decreased mobility;Decreased knowledge of use of DME PT Treatment Interventions: DME instruction;Gait training;Functional mobility training;Therapeutic activities;Therapeutic exercise;Patient/family education   PT Goals Acute Rehab PT Goals PT Goal Formulation: With patient Time For Goal Achievement: 02/01/12 Potential to Achieve Goals: Good Pt will go Sit to Stand: with modified independence PT Goal: Sit to Stand - Progress: Goal set today Pt will go Stand to Sit: with modified independence PT Goal: Stand to Sit - Progress: Goal set today Pt will Ambulate: 51 - 150 feet;with modified independence;with rolling walker PT Goal: Ambulate - Progress: Goal set today Pt will Perform Home Exercise Program: Independently PT Goal: Perform Home Exercise Program - Progress: Goal set today  Visit Information  Last PT Received On: 01/25/12 Assistance Needed: +1    Subjective Data  Subjective: "I had to drive fast" Patient Stated Goal: "get home"   Prior Functioning  Home Living Lives With: Alone Available Help at Discharge: Family;Available PRN/intermittently (7 boys and 3 girls who rotate time with her close to 24hr) Type of Home: House Home Access: Ramped entrance Home Layout: One level Bathroom Shower/Tub: Health visitor: Standard (with 3in1 over toilet) Bathroom Accessibility: Yes How Accessible: Accessible via walker Home Adaptive Equipment: Shower chair with back;Grab bars in shower;Hand-held shower hose;Bedside commode/3-in-1;Walker - rolling (2 3in1) Prior Function Level of Independence: Needs assistance Needs Assistance: Light Housekeeping;Meal Prep Meal Prep: Total Light Housekeeping: Total Able to Take Stairs?: No Driving: No Vocation: Retired    IT consultant  Overall Cognitive Status: Appears  within functional limits for tasks assessed/performed Arousal/Alertness: Awake/alert Orientation Level: Appears intact for tasks assessed Behavior During Session: [facility] for tasks performed    Extremity/Trunk Assessment Right Upper Extremity Assessment RUE ROM/Strength/Tone: [facility] for tasks assessed Left Upper Extremity Assessment LUE ROM/Strength/Tone: [facility] for tasks assessed Right Lower Extremity Assessment RLE ROM/Strength/Tone: [facility] for tasks assessed Left Lower Extremity Assessment LLE ROM/Strength/Tone: [facility] for tasks assessed Trunk Assessment Trunk Assessment: Normal      End of Session PT - End of Session Equipment Utilized During Treatment: Gait belt Activity Tolerance: Patient tolerated treatment well Patient left: in chair;with call bell/phone within reach;with family/visitor present (Daughter present) Nurse Communication: Mobility status    Army Chaco SPT 01/25/2012, 11:56 AM

## 2012-01-25 NOTE — Progress Notes (Signed)
[facility] PROGRESS NOTE  JENNIKA RINGGOLD ZOX:[id number] DOB: February 05, 1916 DOA: 01/21/2012 PCP: [name], MD  Brief narrative: [name] is a 78 y.o. female with past medical history of hypertension, questionable hyperlipidemia, chronic kidney disease, hypothyroidism, history of CHF who presents to the ED with a 2-3 week history of worsening shortness of breath on exertion. Patient's daughter is at bedside stated that symptoms began on 12/31/2011 with shortness of breath. Patient presented to PCPs office and was being treated for bronchitis. Per family patient's shortness of breath has been worsening since then to the point whereby on the day of admission patient went to the bathroom and unable to get off the toilet secondary to worsening shortness of breath and generalized weakness. The patient's daughters also state the patient was coughing up yellow sputum for a couple of days   Assessment/Plan: Principal Problem:  *Acute respiratory distress  Acute on chronic exacerbation of Diastolic CHF (congestive heart failure) Diuresis per Cardiology, clinically improving,   Weight down 16lbs, 3.8L negative, check BNP in am Pt did not take her Lasix for about 2 wks prior to admission Lasix on hold today per cards due to contraction alkalosis   Malignant hypertension: Improved now, continue norvasc, coreg, nitrates  ? Bronchitis Cough with yellow sputum continues- started on zithromax per Dr.Rizwan 12/8, continue this for 1 more day  Pulm HTN:   HYPOTHYROIDISM tsh stable  CORONARY ARTERY DISEASE: stable, cardiac enzymes negative  GERD  CKD (chronic kidney disease) 3 Fluctuating with diuresis, lasix on hold today per cards Ambulate, PT/OT   Code Status: full Family Communication: daughter in room Disposition Plan: telem, home with [facility] services DVT prophylaxis:scds   Consultants:  [place] cards  Procedures:  none  Antibiotics: Rocephin 12/6 Zithromax 12/6  >>  HPI/Subjective: Feels better, breathing improved  Objective: Filed Vitals:   01/25/12 1020 01/25/12 1022 01/25/12 1109 01/25/12 1342  BP:    117/58  Pulse:   80 70  Temp:    98 F (36.7 C)  TempSrc:    Oral  Resp:    18  Height:      Weight:      SpO2: 79% 99% 96% 96%    Intake/Output Summary (Last 24 hours) at 01/25/12 1542 Last data filed at 01/25/12 1300  Gross per 24 hour  Intake    598 ml  Output    750 ml  Net   -152 ml    Exam:   General:  Alert, no distress, oriented x 3  Cardiovascular: RRR, murmur 3/6  Respiratory:   B/l basilar crackles  Abdomen: soft, mild tenderness in lower abdomen, bs +  Ext: no c/c/e  Data Reviewed: Basic Metabolic Panel:  Lab 01/25/12 8119 01/24/12 0710 01/23/12 0558 01/22/12 0645 01/21/12 1958 01/21/12 1320  NA 139 142 144 142 -- 145  K 3.9 3.3* 3.8 4.3 -- 4.3  CL 91* 95* 99 101 -- 108  CO2 41* 39* 36* 31 -- 27  GLUCOSE 79 72 87 132* -- 129*  BUN 27* 28* 30* 28* -- 31*  CREATININE 1.36* 1.38* 1.44* 1.35* -- 1.69*  CALCIUM 9.2 9.2 9.2 9.8 -- 9.3  MG -- -- -- -- 2.0 --  PHOS -- -- -- -- -- --   Liver Function Tests: No results found for this basename: AST:5,ALT:5,ALKPHOS:5,BILITOT:5,PROT:5,ALBUMIN:5 in the last 168 hours No results found for this basename: LIPASE:5,AMYLASE:5 in the last 168 hours No results found for this basename: AMMONIA:5 in the last 168 hours CBC:  Lab 01/22/12 0645 01/21/12 1320  WBC 6.6 4.5  NEUTROABS -- 3.7  HGB 12.2 11.7*  HCT 40.0 38.0  MCV 90.5 93.6  PLT 155 167   Cardiac Enzymes:  Lab 01/22/12 0550 01/21/12 2228 01/21/12 1629  CKTOTAL 55 46 53  CKMB 3.1 2.5 2.5  CKMBINDEX -- -- --  TROPONINI <0.30 <0.30 <0.30   BNP (last 3 results)  Basename 01/25/12 0550 01/22/12 0550 01/21/12 1320  PROBNP 4459.0* 28193.0* 12008.0*   CBG:  Lab 01/23/12 2131  GLUCAP 92    Recent Results (from the past 240 hour(s))  URINE CULTURE     Status: Normal   Collection Time   01/21/12   5:11 PM      Component Value Range Status Comment   Specimen Description URINE, CLEAN CATCH   Final    Special Requests NONE   Final    Culture  Setup Time 01/21/2012 19:03   Final    Colony Count NO GROWTH   Final    Culture NO GROWTH   Final    Report Status 01/22/2012 FINAL   Final   MRSA PCR SCREENING     Status: Normal   Collection Time   01/21/12  7:29 PM      Component Value Range Status Comment   MRSA by PCR NEGATIVE  NEGATIVE Final      Studies: Dg Chest 2 View  01/21/2012  *RADIOLOGY REPORT*  Clinical Data: 78 year old female shortness of breath and respiratory distress.  CHEST - 2 VIEW  Comparison: 12/31/2011 and prior chest radiographs  Findings: Cardiomegaly is again noted. This is a low-volume film with pointy vascular congestion, small bilateral pleural effusions and interstitial opacities - likely representing pulmonary edema. Bibasilar opacities are present likely representing atelectasis but superimposed pneumonia is difficult to exclude. There is no evidence of pneumothorax or acute bony abnormality.  IMPRESSION: Low-volume film with pulmonary vascular congestion, probable interstitial pulmonary edema, small bilateral pleural effusions and bibasilar atelectasis.  Superimposed pneumonia is difficult to exclude.   Original Report Authenticated By: Harmon Pier, M.D.    Dg Chest Port 1 View  01/22/2012  *RADIOLOGY REPORT*  Clinical Data: CHF.  PORTABLE CHEST - 1 VIEW  Comparison: 01/21/2012  Findings: Cardiomegaly.  Low lung volumes with bilateral airspace disease and probable layering effusions.  No acute bony abnormality.  No change.  IMPRESSION: No significant change.   Original Report Authenticated By: Charlett Nose, M.D.     Scheduled Meds:    . amLODipine  10 mg Oral Daily  . aspirin EC  81 mg Oral Daily  . calcium carbonate  1 tablet Oral BID  . carvedilol  12.5 mg Oral BID WC  . feeding supplement  1 Container Oral TID BM  . guaiFENesin  600 mg Oral BID  .  hydrALAZINE  10 mg Oral Q8H  . isosorbide dinitrate  10 mg Oral BID  . levothyroxine  75 mcg Oral QAC breakfast  . nitroGLYCERIN  0.4 mg Sublingual Once  . pantoprazole  40 mg Oral Daily  . potassium chloride  40 mEq Oral BID  . sodium chloride  3 mL Intravenous Q12H  . temazepam  30 mg Oral QHS  . [DISCONTINUED] Ensure Plus  237 mL Oral Daily  . [DISCONTINUED] furosemide  40 mg Oral BID   Continuous Infusions:   ________________________________________________________________________  Time spent: 35 min    Shaquanta Harkless  [facility] Pager [phone] If 8PM-8AM, please contact night-coverage at [email or web address], password [facility] 01/25/2012, 3:42 PM  LOS: 4 days

## 2012-01-25 NOTE — Progress Notes (Signed)
INITIAL NUTRITION ASSESSMENT  DOCUMENTATION CODES Per approved criteria  -Not Applicable    INTERVENTION: 1. Change ensure complete to [place] po TID, each supplement provides 250 kcal and 9 grams of protein. 2. RD will continue to follow    NUTRITION DIAGNOSIS: Inadequate oral intake  related to decreased appetite as evidenced by weight loss.   Goal: PO intake to meet >/=90% estimated nutrition needs  Monitor:  PO intake, BM's, weight trend, labs  Reason for Assessment: RN verbal consult   78 y.o. female  Admitting Dx: Acute respiratory distress  ASSESSMENT: RN asked RD to see pt r/t Ensure causing diarrhea.  Pt's daughter states she is not sure what is causing the diarrhea, but she has a loose BM after every meal.  Pt eats 2 meals and drinks one Ensure daily. Daughter concerned pt is not eating enough. Pt weight has been trending down, at last admission pt was 128 lbs. Some weight fluctuation likely r/t fluids.  Encouraged pt to continue supplements at home, try different brands and types for best tolerance. Also, encouraged family to increase kcal/protein in foods, may consider a protein supplement to add to foods and beverages to increased kcal density.    Height: Ht Readings from Last 1 Encounters:  01/21/12 5' (1.524 m)   Weight: Wt Readings from Last 1 Encounters:  01/25/12 125 lb 11.2 oz (57.017 kg)   Ideal Body Weight: 45.5 kg   % Ideal Body Weight: 126%  Wt Readings from Last 10 Encounters:  01/25/12 125 lb 11.2 oz (57.017 kg)  11/29/11 137 lb 12.8 oz (62.506 kg)  09/24/11 133 lb 13.1 oz (60.7 kg)  08/24/11 138 lb 12.8 oz (62.959 kg)  12/29/10 140 lb (63.504 kg)  12/29/10 140 lb (63.504 kg)  08/04/10 137 lb 9.6 oz (62.415 kg)  03/16/10 137 lb (62.143 kg)  09/15/09 143 lb (64.864 kg)   Usual Body Weight: 135-137 lbs   % Usual Body Weight: 93%  BMI:  Body mass index is 24.55 kg/(m^2). WNL   Estimated Nutritional Needs: Kcal: 1200-1400   Protein: 57-68 gm  Fluid: 1.2-1.4 L   Skin: No skin breakdown   Diet Order: Cardiac PO intake: 50% breakfast   EDUCATION NEEDS: -No education needs identified at this time   Intake/Output Summary (Last 24 hours) at 01/25/12 1201 Last data filed at 01/25/12 1004  Gross per 24 hour  Intake    778 ml  Output   1450 ml  Net   -672 ml   Last BM: 12/10 per family    Labs:   Lab 01/25/12 0550 01/24/12 0710 01/23/12 0558 01/21/12 1958  NA 139 142 144 --  K 3.9 3.3* 3.8 --  CL 91* 95* 99 --  CO2 41* 39* 36* --  BUN 27* 28* 30* --  CREATININE 1.36* 1.38* 1.44* --  CALCIUM 9.2 9.2 9.2 --  MG -- -- -- 2.0  PHOS -- -- -- --  GLUCOSE 79 72 87 --    CBG (last 3)   Basename 01/23/12 2131  GLUCAP 92    Scheduled Meds:   . amLODipine  10 mg Oral Daily  . aspirin EC  81 mg Oral Daily  . calcium carbonate  1 tablet Oral BID  . carvedilol  12.5 mg Oral BID WC  . Ensure Plus  237 mL Oral Daily  . guaiFENesin  600 mg Oral BID  . hydrALAZINE  10 mg Oral Q8H  . isosorbide dinitrate  10 mg Oral BID  .  levothyroxine  75 mcg Oral QAC breakfast  . nitroGLYCERIN  0.4 mg Sublingual Once  . pantoprazole  40 mg Oral Daily  . potassium chloride  40 mEq Oral BID  . sodium chloride  3 mL Intravenous Q12H  . temazepam  30 mg Oral QHS  . [DISCONTINUED] furosemide  40 mg Oral BID   Continuous Infusions:   Past Medical History  Diagnosis Date  . Hyperlipidemia   . Hypertension   . Chronic kidney disease   . Hypothyroidism   . Cardiomegaly   . CHF (congestive heart failure)     Recent hospitalization for CHF & pneumonia -- EF is 40-45%  ( 03/16/10 by Dr. Peter [place] )    . Shortness of breath   . Dyspnea   . Stroke     history of   . History of rheumatic fever   . Head and neck cancer     status post radiation therapy  . Coronary artery disease     EF of 40-45% with history of angioplasty  . Pulmonary hypertension     with PA pressure estimated at 53 mmHg  . Pulmonary  edema   . Hypoxia   . Aortic insufficiency     moderate-severe  . Arthritis   . Pneumonia     hx of  . Anemia   . CKD (chronic kidney disease) 01/21/2012  . Complication of anesthesia     wakes up slowly    Past Surgical History  Procedure Date  . Tonsillectomy   . Thyroidectomy, partial   . Hammer toe surgery   . Refractive surgery     both eyes  . Coronary angioplasty     with angioplasty  . Joint replacement     bilat  . Knee surgery     bilat      Clarene Duke RD, LDN Pager [phone] After Hours pager [phone]

## 2012-01-25 NOTE — Progress Notes (Signed)
Patient Name: [name] Date of Encounter: 01/25/2012     Principal Problem:  *Acute respiratory distress Active Problems:  HYPOTHYROIDISM  CORONARY ARTERY DISEASE  GERD  DYSPHAGIA  Malignant hypertension  CKD (chronic kidney disease)  Acute on chronic diastolic CHF (congestive heart failure)    SUBJECTIVE: Feels well. No SOB, chest pain, lightheadedness, palpitations, swelling, orthopnea or PND.    OBJECTIVE  Filed Vitals:   01/24/12 2036 01/24/12 2220 01/24/12 2221 01/25/12 0355  BP: 119/66 132/45 129/42 147/61  Pulse: 68 78 72 74  Temp: 98.8 F (37.1 C)   98.7 F (37.1 C)  TempSrc: Oral   Oral  Resp: 16   18  Height:      Weight:    57.017 kg (125 lb 11.2 oz)  SpO2: 95%   100%    Intake/Output Summary (Last 24 hours) at 01/25/12 0843 Last data filed at 01/25/12 0834  Gross per 24 hour  Intake   1015 ml  Output   1450 ml  Net   -435 ml   Weight change: 0.317 kg (11.2 oz)  PHYSICAL EXAM  General: Elderly, thin, well developed, in no acute distress. Head: Normocephalic, atraumatic, sclera non-icteric, no xanthomas, nares are without discharge., mouth is is dry  Neck: Supple without bruits or JVD. Lungs:  Resp regular and unlabored, CTAB without wheezes, rales or rhonchi Heart: RRR no s3, s4, or murmurs. Abdomen: Soft, non-tender, non-distended, BS + x 4.  Msk:  Strength and tone appears normal for age. Extremities: No clubbing, cyanosis or edema. DP/PT/Radials 2+ and equal bilaterally. Neuro:  Alert and oriented X 3. Moves all extremities spontaneously. Psych: Normal affect.  LABS:  Lab 01/25/12 0550 01/24/12 0710 01/23/12 0558  NA 139 142 144  K 3.9 3.3* 3.8  CL 91* 95* 99  CO2 41* 39* 36*  BUN 27* 28* 30*  CREATININE 1.36* 1.38* 1.44*  CALCIUM 9.2 9.2 9.2  PROT -- -- --  BILITOT -- -- --  ALKPHOS -- -- --  ALT -- -- --  AST -- -- --  AMYLASE -- -- --  LIPASE -- -- --  GLUCOSE 79 72 87   TELE: NSR, 60-80 bpm  Radiology/Studies:    Dg Chest 2 View  01/21/2012  *RADIOLOGY REPORT*  Clinical Data: 78 year old female shortness of breath and respiratory distress.  CHEST - 2 VIEW  Comparison: 12/31/2011 and prior chest radiographs  Findings: Cardiomegaly is again noted. This is a low-volume film with pointy vascular congestion, small bilateral pleural effusions and interstitial opacities - likely representing pulmonary edema. Bibasilar opacities are present likely representing atelectasis but superimposed pneumonia is difficult to exclude. There is no evidence of pneumothorax or acute bony abnormality.  IMPRESSION: Low-volume film with pulmonary vascular congestion, probable interstitial pulmonary edema, small bilateral pleural effusions and bibasilar atelectasis.  Superimposed pneumonia is difficult to exclude.   Original Report Authenticated By: Harmon Pier, M.D.    Dg Chest Port 1 View  01/22/2012  *RADIOLOGY REPORT*  Clinical Data: CHF.  PORTABLE CHEST - 1 VIEW  Comparison: 01/21/2012  Findings: Cardiomegaly.  Low lung volumes with bilateral airspace disease and probable layering effusions.  No acute bony abnormality.  No change.  IMPRESSION: No significant change.   Original Report Authenticated By: Charlett Nose, M.D.     Current Medications:     . amLODipine  10 mg Oral Daily  . aspirin EC  81 mg Oral Daily  . calcium carbonate  1 tablet Oral BID  .  carvedilol  12.5 mg Oral BID WC  . Ensure Plus  237 mL Oral Daily  . guaiFENesin  600 mg Oral BID  . isosorbide dinitrate  10 mg Oral BID  . levothyroxine  75 mcg Oral QAC breakfast  . nitroGLYCERIN  0.4 mg Sublingual Once  . pantoprazole  40 mg Oral Daily  . potassium chloride  40 mEq Oral BID  . sodium chloride  3 mL Intravenous Q12H  . temazepam  30 mg Oral QHS  . [DISCONTINUED] furosemide  40 mg Oral BID  . [DISCONTINUED] potassium chloride  10 mEq Oral Daily    ASSESSMENT AND PLAN:  1. Acute respiratory failure- due to # 2. Resolved.   2. Hypertensive emergency-  with resultant flash pulmonary edema. Resolved.   3. Acute on chronic diastolic CHF- I/O: - 4316 since admission. Weight down significantly (~15-16 lbs). Lasix held yesterday. pBNP much improved from admission. Question of medical compliance two weeks leading up to admission. Prior office notes also indicates dietary indiscretion. Will need good heart failure education, weight monitoring, Na+ restriction, diuretic use on discharge. Lives at home alone, but has strong family support nearby. Consult case management for home health RN services/HF home screen for clinical support. Feels well this AM. No CHF-type symptoms. She has developed a contraction alkalosis.   Will need to be resumed on low-dose Lasix at discharge. Could try 20mg  daily and/or PRN dosing for weight gain, close follow-up and BMET to assess electrolytes and renal function. May be candidate for [facility] services.    4. Pulmonary hypertension- question secondary to longstanding left-sided diastolic dysfunction.   5. Hypertension, uncontrolled- improved on current antihypertensives. Continue Norvasc, Coreg, Isordil. Would switch hydralazine to scheduled for mortality benefit in combo with nitrate. D/c Norvasc if needed to allow room to start this. No ACEi/ARB d/t renal dysfunction.   6. A/CKD, stage III- improving. Lasix held.   7. CAD- TnI WNL on admission. No chest pain or ischemic symptoms. Stable.   Ambulate today. Stable from a cardiac perspective. Disposition/discharge planning per primary team.    Signed, R. Hurman Horn, PA-C 01/25/2012, 8:43 AM  Attending Note:   The patient was seen and examined.  Agree with assessment and plan as noted above.  I have made changes as needed to the note.  I think the addition of Hydralazine would possibly benefit her.  It may help prevent repeat hospitalizations.  We can lower the dose of Amlodipine as needed if her BP drops with the addition of Hydralazine.  Vesta Mixer, Montez Hageman., MD,  [facility] 01/25/2012, 9:20 AM

## 2012-01-25 NOTE — Evaluation (Signed)
Seen and agree with SPT note Sidonia Nutter Tabor Trenee Igoe, PT [phone]  

## 2012-01-25 NOTE — Progress Notes (Signed)
CRITICAL VALUE ALERT  Critical value received:  CO2 41  Date of notification:  01/25/12  Time of notification:  0735  Critical value read back:yes  Nurse who received alert:  Nino Glow RN  MD notified (1st page):  n/a  Time of first page:  n/a  MD notified (2nd page):  Time of second page:  Responding MD:  Dr. Jomarie Longs verbally notified at 7:45 am  Time MD responded:  7:45 am

## 2012-01-26 LAB — BASIC METABOLIC PANEL
Calcium: 9.6 mg/dL (ref 8.4–10.5)
GFR calc Af Amer: 30 mL/min — ABNORMAL LOW (ref >90)
GFR calc non Af Amer: 26 mL/min — ABNORMAL LOW (ref >90)
Glucose, Bld: 173 mg/dL — ABNORMAL HIGH (ref 70–99)
Potassium: 4.2 mEq/L (ref 3.5–5.1)
Sodium: 140 mEq/L (ref 135–145)

## 2012-01-26 MED ORDER — LOPERAMIDE HCL 2 MG PO CAPS
2.0000 mg | ORAL_CAPSULE | Freq: Once | ORAL | Status: AC
  Administered 2012-01-26: 2 mg via ORAL
  Filled 2012-01-26: qty 1

## 2012-01-26 NOTE — Progress Notes (Signed)
PROGRESS NOTE  [name]:[id number] DOB: October 11, 1915 DOA: 01/21/2012 PCP: [name], MD  Brief narrative: 78 year old female admitted 01/21/2012 with 2-3 week history of shortness of breath. Initial BNP was 28,000. Patient also presented with hypertensive urgency and was subsequently diuresis to aggressively from an admission weight of 140 pounds to 124 pounds.  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  Admission 09/13/2009 for systolic heart failure decompensation  Nasal endoscopy with cauterization of bleeding done 09/10/2005  Consultants:  Cardiology  Procedures:  Two-view chest x-ray 01/21/2012 = palmar vascular congestion, interstitial edema, small bilateral effusions, superimposed pneumonia difficult to exclude  Chest x-ray 01/22/2012 = no acute change  Antibiotics:  Azithromycin 01/21/2012 >>01/23/2012  Ceftriaxone 01/21/2012   Subjective  Doing well. Still has some mild coughing but the sputum has turned clear. She has some cramping in her lower extremities. Hydralazine seemed to make her have headaches. It was noted also her blood pressure with low normal with this this afternoon Denies chest pain or shortness of breath currently   Objective    Interim History: Appreciate cardiology input, appreciate [facility] input Nutritionist=RN asked RD to see pt r/t Ensure causing diarrhea. Pt's daughter states she is not sure what is causing the diarrhea, but she has a  loose BM after every meal. Pt eats 2 meals and drinks one Ensure daily. Daughter concerned pt is not eating enough. Pt weight has been trending down, at last admission pt was 128 lbs. Some weight fluctuation likely r/t fluids. Encouraged pt to continue supplements at home, try different brands and types for best tolerance. Also, encouraged family to increase kcal/protein in foods, may consider a protein supplement to add to foods and beverages to increased kcal density.   Telemetry: Sinus rhythm  Objective: Filed Vitals:   01/25/12 2100 01/26/12 0417 01/26/12 0900 01/26/12 1439  BP: 96/60 137/72 124/67 104/50  Pulse: 67 72 68 66  Temp: 98 F (36.7 C) 97.9 F (36.6 C)  97.8 F (36.6 C)  TempSrc: Oral Oral  Oral  Resp: 19 18 18 18   Height:      Weight:  123 lb 11.2 oz (56.11 kg)    SpO2: 93% 99% 98% 92%    Intake/Output Summary (Last 24 hours) at 01/26/12 1619 Last data filed at 01/26/12 1000  Gross per 24 hour  Intake   1002 ml  Output    275 ml  Net    727 ml    Exam:  General: Alert pleasant but frail Afro-American female in no apparent distress Cardiovascular: S1-S2 grade 5/6 holosystolic murmur best heard left upper sternal edge/left lower sternal border Respiratory: Diffuse crackles with no tactile vocal rest Abdomen: Soft nontender obese Skin onychogryphosis overdose Neuro grossly intact  Data Reviewed: Basic Metabolic Panel:  Lab 01/26/12 8119 01/25/12 0550 01/24/12 0710 01/23/12 0558 01/22/12 0645 01/21/12 1958  NA 140 139 142 144 142 --  K 4.2 3.9 -- -- -- --  CL 95* 91* 95* 99 101 --  CO2 33* 41* 39* 36* 31 --  GLUCOSE 173* 79 72 87 132* --  BUN 29* 27*  28* 30* 28* --  CREATININE 1.63* 1.36* 1.38* 1.44* 1.35* --  CALCIUM 9.6 9.2 9.2 9.2 9.8 --  MG -- -- -- -- -- 2.0  PHOS -- -- -- -- -- --   Liver Function Tests: No results found for this basename: AST:5,ALT:5,ALKPHOS:5,BILITOT:5,PROT:5,ALBUMIN:5 in the last 168 hours No results found for this  basename: LIPASE:5,AMYLASE:5 in the last 168 hours No results found for this basename: AMMONIA:5 in the last 168 hours CBC:  Lab 01/22/12 0645 01/21/12 1320  WBC 6.6 4.5  NEUTROABS -- 3.7  HGB 12.2 11.7*  HCT 40.0 38.0  MCV 90.5 93.6  PLT 155 167   Cardiac Enzymes:  Lab 01/22/12 0550 01/21/12 2228 01/21/12 1629  CKTOTAL 55 46 53  CKMB 3.1 2.5 2.5  CKMBINDEX -- -- --  TROPONINI <0.30 <0.30 <0.30   BNP: No components found with this basename: POCBNP:5 CBG:  Lab 01/23/12 2131  GLUCAP 92    Recent Results (from the past 240 hour(s))  URINE CULTURE     Status: Normal   Collection Time   01/21/12  5:11 PM      Component Value Range Status Comment   Specimen Description URINE, CLEAN CATCH   Final    Special Requests NONE   Final    Culture  Setup Time 01/21/2012 19:03   Final    Colony Count NO GROWTH   Final    Culture NO GROWTH   Final    Report Status 01/22/2012 FINAL   Final   MRSA PCR SCREENING     Status: Normal   Collection Time   01/21/12  7:29 PM      Component Value Range Status Comment   MRSA by PCR NEGATIVE  NEGATIVE Final      Studies:              All Imaging reviewed and is as per above notation   Scheduled Meds:   . amLODipine  10 mg Oral Daily  . aspirin EC  81 mg Oral Daily  . calcium carbonate  1 tablet Oral BID  . carvedilol  12.5 mg Oral BID WC  . feeding supplement  1 Container Oral TID BM  . guaiFENesin  600 mg Oral BID  . hydrALAZINE  10 mg Oral Q8H  . isosorbide dinitrate  10 mg Oral BID  . levothyroxine  75 mcg Oral QAC breakfast  . nitroGLYCERIN  0.4 mg Sublingual Once  . pantoprazole  40 mg Oral Daily  . potassium chloride  40 mEq Oral BID  . sodium chloride  3 mL Intravenous Q12H  . temazepam  30 mg Oral QHS   Continuous Infusions:    Assessment/Plan: *Acute respiratory distress  Acute on chronic exacerbation of Diastolic CHF (congestive heart failure)  Diuresis per Cardiology, clinically improving,  Wt Readings from Last 3  Encounters:  01/26/12 123 lb 11.2 oz (56.11 kg)  11/29/11 137 lb 12.8 oz (62.506 kg)  09/24/11 133 lb 13.1 oz (60.7 kg)  Pt did not take her Lasix for about 2 wks prior to admission -Lasix currently on hold secondary to contraction alkalosis-rest per cardiologist Malignant hypertension:  Improved now, continue norvasc 10 mg daily, coreg 0.5 mg twice a day, Asian is currently on Isordil 10 mg twice a day-given she is also on potassium channel blocker or beta blocker and she dropped her blood pressure earlier today, I will discontinue her hydralazine. Technically she should be on ideal given she has a history of CHF however I  am not sure she would tolerate this.  ? Bronchitis  Cough with clearing sputum-no role for antibiotics at present. No fever or chills. Expectant management-with Mucinex 600 twice a day, albuterol 2.5 every 4 when necessary, Atrovent 0.5 every 4 when necessary Pulm HTN: Severe-unclear for candidate for valve replacement given age and multiple comorbidities-this is to be addressed as an outpatient with her primary cardiologist Dr. Elease Hashimoto HYPOTHYROIDISM  tsh stable-continue Synthroid 75 mcg every morning CORONARY ARTERY DISEASE: stable, cardiac enzymes negative-continue Coreg 12.5, continue Isordil 10 mg twice a day , continue 81 mg aspirin daily GERD-continue Protonix 40 mg daily, TUMS 20 mg twice a day, will discontinue Mylanta given this can sometimes cause diarrhea Diarrhoea-this is chronic diarrhea-this low yield in getting stool studies for fat at present time. I will place her on Imodium 2 mg when necessary. I suspect that in sure what part of the cause is but if she's getting a feeding supplement this may soften her stool. There's no good answer for this and as long as this is not infectious and we do not presume the same, I feel it reasonable to keep her on a constipating agent. CKD (chronic kidney disease) 3  Fluctuating with diuresis, lasix on hold today per cards   Anxiety-continue temazepam 30 mg each bedtime-recommend discontinuation as an outpatient   Ambulate, PT/OT   Code Status: Full Family Communication: Full discussion with daughter at bedside Disposition Plan: Inpatient likely disposition to home in one to 2 days   Pleas Koch, MD  [facility] Pager [phone] 01/26/2012, 4:19 PM    LOS: 5 days

## 2012-01-26 NOTE — Care Management Note (Signed)
    Page 1 of 2   01/26/2012     11:40:08 AM   CARE MANAGEMENT NOTE 01/26/2012  Patient:  [name], [name]   Account Number:  [id number]  Date Initiated:  01/26/2012  Documentation initiated by:  Tera Mater  Subjective/Objective Assessment:   78yo female admitted with CHF.  Pt. lives alone, however has several children that helps her, and her son lives very nearby.     Action/Plan:   In to speak with pt. and daughter, [name] about [facility] services.    Pt. has a walker, and wheelchair at home.   Anticipated DC Date:  01/27/2012   Anticipated DC Plan:  HOME W HOME HEALTH SERVICES      DC Planning Services  CM consult      [facility] Choice  HOME HEALTH   Choice offered to / List presented to:  C-1 Patient        HH arranged  HH-1 RN  HH-10 DISEASE MANAGEMENT      HH agency  [facility]   Status of service:  In process, will continue to follow Medicare Important Message given?   (If response is "NO", the following Medicare IM given date fields will be blank) Date Medicare IM given:   Date Additional Medicare IM given:    Discharge Disposition:    Per UR Regulation:  Reviewed for med. necessity/level of care/duration of stay  If discussed at Long Length of Stay Meetings, dates discussed:    Comments:  01/26/12 1000 In to speak with pt. and daughter, [name] about [facility] services.  Pt. has home oxygen thru [facility].  Pt. is interested in having HH RN for HF managment, and they chose [facility].  Gave daughter list of agencies for private duty as well. Currently the children rotate staying with pt., however there are a few hours during night time that pt. is alone.  Referral made to Tim, with [facility] as well for follow up care. Pt. to dc home tomorrow. Tera Mater, RN, BSN NCM [phone]

## 2012-01-26 NOTE — Progress Notes (Signed)
Patient: [name] / Admit Date: 01/21/2012 / Date of Encounter: 01/26/2012, 7:24 AM   Subjective  Feels well. No complaints. No CP or SOB. No LEE.   Objective   Telemetry: NSR occ PVC, PAC Physical Exam: Filed Vitals:   01/26/12 0417  BP: 137/72  Pulse: 72  Temp: 97.9 F (36.6 C)  Resp: 18   General: Thin elderly AAF in no acute distress. Head: Normocephalic, atraumatic, sclera non-icteric, no xanthomas, nares are without discharge. Neck: JVD not elevated. Lungs:  Rhonchi bilaterally, no wheezing today. Heart: RRR S1 S2 with 3/6 SEM heard best at LLSB. Abdomen: Soft, non-tender, non-distended with normoactive bowel sounds. No hepatomegaly. No rebound/guarding. No obvious abdominal masses. Msk:  Strength and tone appear normal for age. Extremities: No clubbing or cyanosis. No edema.  Distal pedal pulses are 2+ and equal bilaterally. Neuro: Alert and oriented X 3. Moves all extremities spontaneously. Psych:  Responds to questions appropriately with a normal affect.    Intake/Output Summary (Last 24 hours) at 01/26/12 0724 Last data filed at 01/26/12 0650  Gross per 24 hour  Intake    685 ml  Output   1025 ml  Net   -340 ml    Inpatient Medications:    . amLODipine  10 mg Oral Daily  . aspirin EC  81 mg Oral Daily  . calcium carbonate  1 tablet Oral BID  . carvedilol  12.5 mg Oral BID WC  . feeding supplement  1 Container Oral TID BM  . guaiFENesin  600 mg Oral BID  . hydrALAZINE  10 mg Oral Q8H  . isosorbide dinitrate  10 mg Oral BID  . levothyroxine  75 mcg Oral QAC breakfast  . nitroGLYCERIN  0.4 mg Sublingual Once  . pantoprazole  40 mg Oral Daily  . potassium chloride  40 mEq Oral BID  . sodium chloride  3 mL Intravenous Q12H  . temazepam  30 mg Oral QHS  . [DISCONTINUED] Ensure Plus  237 mL Oral Daily  . [DISCONTINUED] furosemide  40 mg Oral BID    Labs:  [facility] 01/25/12 0550 01/24/12 0710  NA 139 142  K 3.9 3.3*  CL 91* 95*  CO2 41* 39*    GLUCOSE 79 72  BUN 27* 28*  CREATININE 1.36* 1.38*  CALCIUM 9.2 9.2  MG -- --  PHOS -- --    Radiology/Studies:  Dg Chest 2 View  01/21/2012  *RADIOLOGY REPORT*  Clinical Data: 78 year old female shortness of breath and respiratory distress.  CHEST - 2 VIEW  Comparison: 12/31/2011 and prior chest radiographs  Findings: Cardiomegaly is again noted. This is a low-volume film with pointy vascular congestion, small bilateral pleural effusions and interstitial opacities - likely representing pulmonary edema. Bibasilar opacities are present likely representing atelectasis but superimposed pneumonia is difficult to exclude. There is no evidence of pneumothorax or acute bony abnormality.  IMPRESSION: Low-volume film with pulmonary vascular congestion, probable interstitial pulmonary edema, small bilateral pleural effusions and bibasilar atelectasis.  Superimposed pneumonia is difficult to exclude.   Original Report Authenticated By: Harmon Pier, M.D.    Dg Chest Port 1 View  01/22/2012  *RADIOLOGY REPORT*  Clinical Data: CHF.  PORTABLE CHEST - 1 VIEW  Comparison: 01/21/2012  Findings: Cardiomegaly.  Low lung volumes with bilateral airspace disease and probable layering effusions.  No acute bony abnormality.  No change.  IMPRESSION: No significant change.   Original Report Authenticated By: Charlett Nose, M.D.      Assessment and Plan  1. Acute respiratory failure- due to # 2. Resolved.  2. Hypertensive emergency- with resultant flash pulmonary edema. Resolved.  3. Acute on chronic predominantly diastolic CHF- weight down 18lbs, -4200 L. Lasix held 2/2 contraction alkalosis - timing of resumption per MD. Care Mgmt consult has been placed. HHPT also recommended. I put in a referral to [facility] to follow along as outpatient as well (pt and daughter amenable) and they will contact patient to arrange. Hydralazine added yesterday - pt took 2 doses last night but this morning refused since she "stopped taking it at  home." I explained reasoning for med and she is agreeable to taking. No ACEI/ARB 2/2 renal function. 4. Pulmonary hypertension- question secondary to longstanding left-sided diastolic dysfunction.  5. Hypertension, uncontrolled- improved. Hydralazine added yesterday.  6. A/CKD, stage III- improving. Lasix held.  7. CAD - troponin negative on admission. Stable.  Signed, Ronie Spies PA-C   Attending Note:   The patient was seen and examined.  Agree with assessment and plan as noted above.  The note was edited as needed.  She still has lots of rhonchi - sounds more pulmonary than cardiac.  She has made great progress.  Continue current meds.  Have ordered a BMP for this am and tomorrow.   Vesta Mixer, Montez Hageman., MD, [facility] 01/26/2012, 8:46 AM

## 2012-01-26 NOTE — Progress Notes (Signed)
Patient evaluated for community based chronic disease management services with [facility] Care Management Program as a benefit of patient's [employer]. Patient will receive a post discharge transition of care call and will be evaluated for monthly home visits for assessments and disease process education. Spoke with patient and family at bedside to explain [facility] Care Management services. Left contact information and [facility] literature at bedside. Made inpatient Case Manager aware that [facility] Care Management following.  Of note, [facility] Care Management services does not replace or interfere with any services that are arranged by inpatient case management or social work.  For additional questions or referrals please contact Anibal Henderson BSN RN [facility] Liaison at [phone].

## 2012-01-27 DIAGNOSIS — I10 Essential (primary) hypertension: Secondary | ICD-10-CM

## 2012-01-27 LAB — RENAL FUNCTION PANEL
Albumin: 3 g/dL — ABNORMAL LOW (ref 3.5–5.2)
BUN: 32 mg/dL — ABNORMAL HIGH (ref 6–23)
Chloride: 100 mEq/L (ref 96–112)
GFR calc non Af Amer: 22 mL/min — ABNORMAL LOW (ref >90)
Phosphorus: 3.1 mg/dL (ref 2.3–4.6)
Potassium: 5.5 mEq/L — ABNORMAL HIGH (ref 3.5–5.1)

## 2012-01-27 LAB — SODIUM, URINE, RANDOM: Sodium, Ur: 37 mEq/L

## 2012-01-27 LAB — BASIC METABOLIC PANEL
BUN: 29 mg/dL — ABNORMAL HIGH (ref 6–23)
CO2: 32 mEq/L (ref 19–32)
Calcium: 9.3 mg/dL (ref 8.4–10.5)
Chloride: 97 mEq/L (ref 96–112)
Creatinine, Ser: 1.62 mg/dL — ABNORMAL HIGH (ref 0.50–1.10)
GFR calc Af Amer: 30 mL/min — ABNORMAL LOW (ref >90)

## 2012-01-27 LAB — CREATININE, URINE, RANDOM: Creatinine, Urine: 94.73 mg/dL

## 2012-01-27 MED ORDER — SODIUM CHLORIDE 0.9 % IV SOLN: 250.0000 mL | INTRAVENOUS | Status: DC | PRN

## 2012-01-27 MED ORDER — AMLODIPINE BESYLATE 5 MG PO TABS
5.0000 mg | ORAL_TABLET | Freq: Every day | ORAL | Status: DC
  Administered 2012-01-27 – 2012-01-28 (×2): 5 mg via ORAL
  Filled 2012-01-27 (×2): qty 1

## 2012-01-27 NOTE — Progress Notes (Signed)
Pt has out of date IV. Attempted 2 times to insert new IV with no success. IV team paged twice to restart IV. Graceanna Theissen, Melida Quitter

## 2012-01-27 NOTE — Progress Notes (Signed)
Patient: KATHERYN CULLITON / Admit Date: 01/21/2012 / Date of Encounter: 01/27/2012, 7:39 AM   Subjective  Feels well. No complaints. No CP or SOB.  Her BP actually dropped yesterday.  Lasix has been on hold.  She has had some diarrhea.  Hydralazine was stopped.    Objective   Telemetry: NSR occ PVC, PAC Physical Exam: Filed Vitals:   01/27/12 0444  BP: 118/49  Pulse: 70  Temp: 97.9 F (36.6 C)  Resp: 19   General: Thin elderly AAF in no acute distress. Head: Normocephalic, atraumatic, sclera non-icteric, no xanthomas, nares are without discharge. Neck: JVD not elevated. Lungs:  Rhonchi bilaterally, no wheezing today. Heart: RRR S1 S2 with 3/6 SEM heard best at LLSB. Abdomen: Soft, non-tender, non-distended with normoactive bowel sounds. No hepatomegaly. No rebound/guarding. No obvious abdominal masses. Msk:  Strength and tone appear normal for age. Extremities: No clubbing or cyanosis. No edema.  Distal pedal pulses are 2+ and equal bilaterally. Neuro: Alert and oriented X 3. Moves all extremities spontaneously. Psych:  Responds to questions appropriately with a normal affect.    Intake/Output Summary (Last 24 hours) at 01/27/12 0739 Last data filed at 01/27/12 0618  Gross per 24 hour  Intake    680 ml  Output    200 ml  Net    480 ml    Inpatient Medications:     . amLODipine  10 mg Oral Daily  . aspirin EC  81 mg Oral Daily  . calcium carbonate  1 tablet Oral BID  . carvedilol  12.5 mg Oral BID WC  . feeding supplement  1 Container Oral TID BM  . guaiFENesin  600 mg Oral BID  . isosorbide dinitrate  10 mg Oral BID  . levothyroxine  75 mcg Oral QAC breakfast  . [COMPLETED] loperamide  2 mg Oral Once  . nitroGLYCERIN  0.4 mg Sublingual Once  . pantoprazole  40 mg Oral Daily  . potassium chloride  40 mEq Oral BID  . sodium chloride  3 mL Intravenous Q12H  . temazepam  30 mg Oral QHS  . [DISCONTINUED] hydrALAZINE  10 mg Oral Q8H    Labs:  Basename 01/27/12 0515  01/26/12 1000  NA 140 140  K 5.5* 4.2  CL 100 95*  CO2 34* 33*  GLUCOSE 88 173*  BUN 32* 29*  CREATININE 1.83* 1.63*  CALCIUM 9.3 9.6  MG -- --  PHOS 3.1 --    Radiology/Studies:  Dg Chest 2 View  01/21/2012  *RADIOLOGY REPORT*  Clinical Data: 78 year old female shortness of breath and respiratory distress.  CHEST - 2 VIEW  Comparison: 12/31/2011 and prior chest radiographs  Findings: Cardiomegaly is again noted. This is a low-volume film with pointy vascular congestion, small bilateral pleural effusions and interstitial opacities - likely representing pulmonary edema. Bibasilar opacities are present likely representing atelectasis but superimposed pneumonia is difficult to exclude. There is no evidence of pneumothorax or acute bony abnormality.  IMPRESSION: Low-volume film with pulmonary vascular congestion, probable interstitial pulmonary edema, small bilateral pleural effusions and bibasilar atelectasis.  Superimposed pneumonia is difficult to exclude.   Original Report Authenticated By: Harmon Pier, M.D.    Dg Chest Port 1 View  01/22/2012  *RADIOLOGY REPORT*  Clinical Data: CHF.  PORTABLE CHEST - 1 VIEW  Comparison: 01/21/2012  Findings: Cardiomegaly.  Low lung volumes with bilateral airspace disease and probable layering effusions.  No acute bony abnormality.  No change.  IMPRESSION: No significant change.   Original  Report Authenticated By: Charlett Nose, M.D.      Assessment and Plan  1. Acute respiratory failure- due to # 2. Resolved.  2. Hypertensive emergency- with resultant flash pulmonary edema. Resolved.  I would actually prefer Hydralazine over Amlodipine .  I will decrease amlodipine to 5 mg a day.  We can add Hydralazine if / when her BP increases.  I think her volume depletion is contributing to her lower than normal BP. 3. Acute on chronic predominantly diastolic CHF- weight down 18lbs, -4200 L. Lasix held 2/2 contraction alkalosis - timing of resumption per MD.  4.  Pulmonary hypertension- question secondary to longstanding left-sided diastolic dysfunction.  5. Hypertension, uncontrolled- improved. Hydralazine added yesterday.  6. A/CKD, stage III- improving. Lasix held.  7. CAD - troponin negative on admission. Stable.    Vesta Mixer, Montez Hageman., MD, [facility] 01/27/2012, 7:39 AM

## 2012-01-27 NOTE — Progress Notes (Signed)
Patient was awake, responsive and had some family members visiting with her. Patient expressed strong faith and hope in God. Patient was very positive and high in spirit. Chaplain provided ministry of presence and empathic listening. Chaplain shared words of hope and encouragement. Chaplain will follow-up as needed. Patient and family expressed appreciation for Chaplain's visit and support.

## 2012-01-27 NOTE — Progress Notes (Signed)
Patient potassium level this am is 5.5.  Notified MD. RN will continue to monitor. Louretta Parma, RN

## 2012-01-27 NOTE — Progress Notes (Signed)
Patient BP 99/41. Manual recheck was 110/52.  MD notified. No new orders placed. RN will continue to monitor. Louretta Parma, RN

## 2012-01-27 NOTE — Progress Notes (Signed)
PROGRESS NOTE  [name]:[id number] DOB: 03-31-1915 DOA: 01/21/2012 PCP: Kelly Stabile, MD  Brief narrative: 78 year old female admitted 01/21/2012 with 2-3 week history of shortness of breath. Initial BNP was 28,000. Patient also presented with hypertensive urgency and was subsequently diuresis to aggressively from an admission weight of 140 pounds to 124 pounds. She developed some iatrogenic kidney injury and subsequently Lasix was held. 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  Admission 09/13/2009 for systolic heart failure decompensation  Nasal endoscopy with cauterization of bleeding done 09/10/2005  Consultants:  Cardiology  Procedures:  Two-view chest x-ray 01/21/2012 = palmar vascular congestion, interstitial edema, small bilateral effusions, superimposed pneumonia difficult to exclude  Chest x-ray 01/22/2012 = no acute change  Antibiotics:  Azithromycin 01/21/2012 >>01/23/2012  Ceftriaxone 01/21/2012   Subjective  Doing well. Still has some mild coughing but the sputum has turned clear. She has some cramping in her lower extremities.  States that after walking or after maybe one or 2 medications she develops headaches Diet is well tolerated She said she had little bit of pain with urine yesterday but has no fever  or chills currently Daughter is at bedside.    Objective    Interim History: Appreciate cardiology input, appreciate [facility] input Nutritionist=RN asked RD to see pt r/t Ensure causing diarrhea. Pt's daughter states she is not sure what is causing the diarrhea, but she has a loose BM after every meal. Pt eats 2 meals and drinks one Ensure daily. Daughter concerned pt is not eating enough. Pt weight has been trending down, at last admission pt was 128 lbs. Some weight fluctuation likely r/t fluids. Encouraged pt to continue supplements at home, try different brands and types for best tolerance. Also, encouraged family to increase kcal/protein in foods, may consider a protein supplement to add to foods and beverages to increased kcal density.   Telemetry: Sinus rhythm, rate 60-70s no red alarms  Objective: Filed Vitals:   01/26/12 2104 01/27/12 0444 01/27/12 0837 01/27/12 1030  BP: 113/66 118/49 119/62 144/67  Pulse: 89 70 85   Temp: 98.3 F (36.8 C) 97.9 F (36.6 C)    TempSrc: Oral Oral    Resp: 18 19    Height:      Weight:  122 lb 14.4 oz (55.747 kg)    SpO2: 99% 100%      Intake/Output Summary (Last 24 hours) at 01/27/12 1400 Last data filed at 01/27/12 1032  Gross per 24 hour  Intake    483 ml  Output    200 ml  Net    283 ml    Exam:  General: Alert pleasant but frail Afro-American female in no apparent distress Cardiovascular: S1-S2 grade 5/6 holosystolic murmur best heard left upper sternal edge/left lower sternal border Respiratory: Diffuse crackles with no tactile vocal resonance or fremitus.  After coughing patient's lungs sound clearer Abdomen: Soft nontender obese Skin onychogryphosis overdose Neuro grossly intact  Data Reviewed: Basic Metabolic  Panel:  Lab 01/27/12 0830 01/27/12 0515 01/26/12 1000 01/25/12 0550 01/24/12 0710 01/21/12 1958  NA 138 140 140 139 142 --  K 4.7 5.5* -- -- -- --  CL 97 100 95* 91* 95* --  CO2 32 34* 33* 41* 39* --  GLUCOSE  151* 88 173* 79 72 --  BUN 29* 32* 29* 27* 28* --  CREATININE 1.62* 1.83* 1.63* 1.36* 1.38* --  CALCIUM 9.3 9.3 9.6 9.2 9.2 --  MG -- -- -- -- -- 2.0  PHOS -- 3.1 -- -- -- --   Liver Function Tests:  Lab 01/27/12 0515  AST --  ALT --  ALKPHOS --  BILITOT --  PROT --  ALBUMIN 3.0*   No results found for this basename: LIPASE:5,AMYLASE:5 in the last 168 hours No results found for this basename: AMMONIA:5 in the last 168 hours CBC:  Lab 01/22/12 0645 01/21/12 1320  WBC 6.6 4.5  NEUTROABS -- 3.7  HGB 12.2 11.7*  HCT 40.0 38.0  MCV 90.5 93.6  PLT 155 167   Cardiac Enzymes:  Lab 01/22/12 0550 01/21/12 2228 01/21/12 1629  CKTOTAL 55 46 53  CKMB 3.1 2.5 2.5  CKMBINDEX -- -- --  TROPONINI <0.30 <0.30 <0.30   BNP: No components found with this basename: POCBNP:5 CBG:  Lab 01/23/12 2131  GLUCAP 92    Recent Results (from the past 240 hour(s))  URINE CULTURE     Status: Normal   Collection Time   01/21/12  5:11 PM      Component Value Range Status Comment   Specimen Description URINE, CLEAN CATCH   Final    Special Requests NONE   Final    Culture  Setup Time 01/21/2012 19:03   Final    Colony Count NO GROWTH   Final    Culture NO GROWTH   Final    Report Status 01/22/2012 FINAL   Final   MRSA PCR SCREENING     Status: Normal   Collection Time   01/21/12  7:29 PM      Component Value Range Status Comment   MRSA by PCR NEGATIVE  NEGATIVE Final      Studies:              All Imaging reviewed and is as per above notation   Scheduled Meds:    . amLODipine  5 mg Oral Daily  . aspirin EC  81 mg Oral Daily  . calcium carbonate  1 tablet Oral BID  . carvedilol  12.5 mg Oral BID WC  . feeding supplement  1 Container Oral TID BM  . guaiFENesin  600 mg Oral BID  . isosorbide dinitrate  10 mg Oral BID  . levothyroxine  75 mcg Oral QAC breakfast  . [COMPLETED] loperamide  2 mg Oral Once  . nitroGLYCERIN  0.4 mg Sublingual Once  . pantoprazole  40 mg Oral Daily  .  sodium chloride  3 mL Intravenous Q12H  . temazepam  30 mg Oral QHS  . [DISCONTINUED] amLODipine  10 mg Oral Daily  . [DISCONTINUED] hydrALAZINE  10 mg Oral Q8H  . [DISCONTINUED] potassium chloride  40 mEq Oral BID   Continuous Infusions:    Assessment/Plan: *Acute respiratory distress  Acute on chronic exacerbation of Diastolic CHF (congestive heart failure)  Diuresis per Cardiology, clinically improving,  Filed Weights   01/25/12 0355 01/26/12 0417 01/27/12 0444  Weight: 125 lb 11.2 oz (57.017 kg) 123 lb 11.2 oz (56.11 kg) 122 lb  14.4 oz (55.747 kg)   Pt did not take her Lasix for about 2 wks prior to admission -Lasix currently on hold secondary to contraction alkalosis-rest per cardiologist Malignant hypertension:  Improved now-was normal/low normal 12.11., continue norvasc 10--> [changed 12/12] mg daily, coreg 12.5 mg twice a day,  Isordil 10 mg twice a day-monitor blood pressure overnight. Her blood pressure goal is a 78 year old is higher than JNC 7 classification recommendations-we will place her on when necessary by mouth hydralazine as per Dr. Harvie Bridge recommendations if she persists above 150/90 overnight ? Bronchitis  Cough with clearing sputum-no role for antibiotics at present. No fever or chills. Expectant management-with Mucinex 600 twice a day, albuterol 2.5 every 4 when necessary, Atrovent 0.5 every 4 when necessary Pulm HTN: Severe-unclear for candidate for valve replacement given age and multiple comorbidities-this is to be addressed as an outpatient with her primary cardiologist Dr. Elease Hashimoto HYPOTHYROIDISM  tsh stable-continue Synthroid 75 mcg every morning CORONARY ARTERY DISEASE: stable, cardiac enzymes negative-continue Coreg 12.5, continue Isordil 10 mg twice a day , continue 81 mg aspirin daily GERD-continue Protonix 40 mg daily, TUMS 20 mg twice a day, will discontinue Mylanta given this can sometimes cause diarrhea Diarrhoea-this is chronic diarrhea-this low yield  in getting stool studies for fat at present time. I will place her on Imodium 2 mg when necessary. I suspect that in sure what part of the cause is but if she's getting a feeding supplement this may soften her stool. There's no good answer for this and as long as this is not infectious and we do not presume the same, I feel it reasonable to keep her on a constipating agent prn CKD (chronic kidney disease) 3 EGFR 30 Fluctuating with diuresis, lasix on hold today per cards-it was noted her potassium was 5.5 12.12.13 AM however on repeat it was 4.7-her kidney function seems to be at baseline with Anxiety-continue temazepam 30 mg each bedtime-recommend discontinuation as an outpatient Moderate protein energy malnutrition-albumin level of 3.0. Impaired glucose tolerance-we'll obtain an HbA1c if her blood glucose goes above 200. Otherwise outpatient followup  Ambulate, PT/OT   Code Status: Full Family Communication: Full discussion with daughter at bedside Disposition Plan: disposition to home in one to 2 days   Pleas Koch, MD  [facility] Pager [phone] 01/27/2012, 2:00 PM    LOS: 6 days

## 2012-01-27 NOTE — Progress Notes (Signed)
Physical Therapy Treatment Patient Details Name: [name] MRN: [id number] DOB: Nov 24, 1915 Today's Date: 01/27/2012 Time: 5621-3086 PT Time Calculation (min): 27 min  PT Assessment / Plan / Recommendation Comments on Treatment Session  Very sweet and motivated. Progressing ambulation distance. Definitely at risk for falls. Will need supervision for all OOB/gait and transfers.     Follow Up Recommendations  Home health PT;Supervision for mobility/OOB     Does the patient have the potential to tolerate intense rehabilitation     Barriers to Discharge        Equipment Recommendations  None recommended by PT    Recommendations for Other Services OT consult  Frequency Min 3X/week   Plan Discharge plan needs to be updated;Frequency remains appropriate    Precautions / Restrictions Precautions Precautions: Fall Restrictions Weight Bearing Restrictions: No       Mobility  Bed Mobility Bed Mobility: Supine to Sit Supine to Sit: 5: Supervision Sitting - Scoot to Edge of Bed: 6: Modified independent (Device/Increase time) Transfers Transfers: Sit to Stand;Stand to Sit Sit to Stand: 4: Min guard;From bed;With upper extremity assist Stand to Sit: To chair/3-in-1;4: Min assist;With upper extremity assist;With armrests Details for Transfer Assistance: cues for safe hand placement and stability with controlled descent to chair Ambulation/Gait Ambulation/Gait Assistance: 4: Min guard Ambulation Distance (Feet): 250 Feet Assistive device: Rolling walker Ambulation/Gait Assistance Details: cues for tall posture and to stay safely within the RW Gait Pattern: Trunk flexed;Shuffle;Decreased stride length Gait velocity: slow      PT Goals Acute Rehab PT Goals PT Goal: Sit to Stand - Progress: Progressing toward goal PT Goal: Stand to Sit - Progress: Progressing toward goal PT Goal: Ambulate - Progress: Progressing toward goal  Visit Information  Last PT Received On:  01/27/12 Assistance Needed: +1    Subjective Data  Subjective: I had a headache after I walked the other day.  Patient Stated Goal: home and to walk    Cognition  Overall Cognitive Status: Appears within functional limits for tasks assessed/performed Arousal/Alertness: Awake/alert Orientation Level: Appears intact for tasks assessed Behavior During Session: [facility] for tasks performed    Balance     End of Session PT - End of Session Equipment Utilized During Treatment: Gait belt Activity Tolerance: Patient tolerated treatment well Patient left: in chair;with call bell/phone within reach;with family/visitor present Nurse Communication: Mobility status   GP     [facility] HELEN 01/27/2012, 2:30 PM

## 2012-01-28 DIAGNOSIS — I10 Essential (primary) hypertension: Secondary | ICD-10-CM

## 2012-01-28 LAB — BASIC METABOLIC PANEL
CO2: 30 mEq/L (ref 19–32)
Calcium: 9.2 mg/dL (ref 8.4–10.5)
Creatinine, Ser: 1.57 mg/dL — ABNORMAL HIGH (ref 0.50–1.10)
GFR calc non Af Amer: 27 mL/min — ABNORMAL LOW (ref >90)
Glucose, Bld: 146 mg/dL — ABNORMAL HIGH (ref 70–99)

## 2012-01-28 MED ORDER — GUAIFENESIN ER 600 MG PO TB12: 600.0000 mg | ORAL_TABLET | Freq: Two times a day (BID) | ORAL | Status: DC

## 2012-01-28 MED ORDER — ASPIRIN 81 MG PO TBEC: 81.0000 mg | DELAYED_RELEASE_TABLET | Freq: Every day | ORAL | Status: AC

## 2012-01-28 MED ORDER — AMLODIPINE BESYLATE 5 MG PO TABS: 5.0000 mg | ORAL_TABLET | Freq: Every day | ORAL | Status: DC

## 2012-01-28 MED ORDER — BOOST / RESOURCE BREEZE PO LIQD: 1.0000 | Freq: Three times a day (TID) | ORAL | Status: DC

## 2012-01-28 MED ORDER — NITROGLYCERIN 0.4 MG SL SUBL: 0.4000 mg | SUBLINGUAL_TABLET | Freq: Once | SUBLINGUAL | Status: DC

## 2012-01-28 MED ORDER — IPRATROPIUM BROMIDE 0.02 % IN SOLN: 0.5000 mg | RESPIRATORY_TRACT | Status: DC | PRN

## 2012-01-28 NOTE — Progress Notes (Signed)
Seen and agree with SPT note Chevelle Coulson Tabor Lorry Furber, PT [phone]  

## 2012-01-28 NOTE — Discharge Summary (Signed)
Physician Discharge Summary  AIANNA FAHS ZOX:[id number] DOB: December 11, 1915 DOA: 01/21/2012  PCP: [name], MD  Admit date: 01/21/2012 Discharge date: 01/28/2012  Time spent: 35 minutes  Recommendations for Outpatient Follow-up:  1. Recommend careful reimplementation of blood pressure medications as an outpatient in addition to diuretics. 2. Needs weights, needs a basic metabolic panel in about 3 days 3. Her JNC 7 goal for hypertension would be higher than average adult person-would tolerate up to 160/90 as her blood pressure goal. 4. Monitor closely in the outpatient setting for shortness of breath-she potentially has multifactorial dyspnea secondary to #1 probably hypertension #2 acute on chronic diastolic dysfunction that in hospital. 5. Would recommend weaning her down off of her temazepam as an outpatient 6. Get an HbA1c in 3-4 months if needed  7. Recommend home health physical therapy for safety monitor  Discharge Diagnoses:  Principal Problem:  *Acute respiratory distress Active Problems:  HYPOTHYROIDISM  CORONARY ARTERY DISEASE  GERD  DYSPHAGIA  Malignant hypertension  CKD (chronic kidney disease)  Acute on chronic diastolic CHF (congestive heart failure)   Discharge Condition: Good  Diet recommendation: Regular  Filed Weights   01/26/12 0417 01/27/12 0444 01/28/12 0657  Weight: 123 lb 11.2 oz (56.11 kg) 122 lb 14.4 oz (55.747 kg) 123 lb 0.3 oz (55.8 kg)    History of present illness:  78 year old female admitted 01/21/2012 with 2-3 week history of shortness of breath. Initial BNP was 28,000. Patient also presented with hypertensive urgency and was subsequently diuresis to aggressively from an admission weight of 140 pounds to 124 pounds.  She developed some iatrogenic kidney injury and subsequently Lasix was held. Blood pressure has been difficult to control given peaks and lows and ultimately cardiology elected to place patient on hydralazine when necessary  and   Hospital Course:  *Acute respiratory distress  Acute on chronic exacerbation of Diastolic CHF (congestive heart failure)  Diuresis per Cardiology, clinically improving Filed Weights    01/25/12 0355  01/26/12 0417  01/27/12 0444   Weight:  125 lb 11.2 oz (57.017 kg)  123 lb 11.2 oz (56.11 kg)  122 lb 14.4 oz (55.747 kg)    Pt did not take her Lasix for about 2 wks prior to admission -Lasix currently on hold secondary to contraction alkalosis-her regular cardiologist Dr.Nahser actually saw her in the hospital and made recommendations for her. She will be followed up by the clinic in 2 weeks. Appreciate input  Malignant hypertension:  Improved now-was normal/low normal 12.11., continue norvasc 10--> [changed 12/12] mg daily, coreg 12.5 mg twice a day, Isordil 10 mg twice a day-monitor blood pressure overnight. Her blood pressure goal is a 78 year old is higher than JNC 7 classification recommendations-we will place her on when necessary by mouth hydralazine as per Dr. Harvie Bridge recommendations if she persists above 150/90 overnight   ? Bronchitis  Cough with clearing sputum-no role for antibiotics at present. No fever or chills. Expectant management-with Mucinex 600 twice a day, albuterol 2.5 every 4 when necessary, Atrovent 0.5 every 4 when necessary   Pulm HTN: Severe-unclear for candidate for valve replacement given age and multiple comorbidities-this is to be addressed as an outpatient with her primary cardiologist Dr. Elease Hashimoto   HYPOTHYROIDISM  tsh stable-continue Synthroid 75 mcg every morning   CORONARY ARTERY DISEASE: stable, cardiac enzymes negative-continue Coreg 12.5, continue Isordil 10 mg twice a day , continue 81 mg aspirin daily   GERD-continue Protonix 40 mg daily, TUMS 20 mg twice a day, will discontinue  Mylanta given this can sometimes cause diarrhea   Diarrhoea-this is chronic diarrhea-this low yield in getting stool studies for fat at present time. I will place her on  Imodium 2 mg when necessary. I suspect that in sure what part of the cause is but if she's getting a feeding supplement this may soften her stool. There's no good answer for this and as long as this is not infectious and we do not presume the same, I feel it reasonable to keep her on a constipating agent prn   CKD (chronic kidney disease) 3 EGFR 30  Fluctuating with diuresis, lasix on hold today per cards-it was noted her potassium was 5.5 12.12.13 AM however on repeat it was 4.7-her kidney function seems to be at baseline with   Anxiety-continue temazepam 30 mg each bedtime-recommend discontinuation as an outpatient   Moderate protein energy malnutrition-albumin level of 3.0.   Impaired glucose tolerance-we'll obtain an HbA1c if her blood glucose goes above 200. Otherwise outpatient followup   Consultants:  Cardiology Procedures:  Two-view chest x-ray 01/21/2012 = palmar vascular congestion, interstitial edema, small bilateral effusions, superimposed pneumonia difficult to exclude  Chest x-ray 01/22/2012 = no acute change Antibiotics:  Azithromycin 01/21/2012 >>01/23/2012  Ceftriaxone 01/21/2012  Discharge Exam: Filed Vitals:   01/27/12 2129 01/28/12 0657 01/28/12 1014 01/28/12 1137  BP: 110/52 115/58 104/57   Pulse:  80 70   Temp:  97.8 F (36.6 C)    TempSrc:  Oral    Resp:  20    Height:      Weight:  123 lb 0.3 oz (55.8 kg)    SpO2:  97%  98%   Well, no shortness of breath, diarrhea is completely resolved, ambulated with physical therapy well. Family in room  General: Alert oriented pleasant African American female looking younger than stated age, arcus senilis present, no icterus no pallor, dentition Cardiovascular: S1-S2 no murmur rub or gallop telemetry = sinus rhythm Respiratory: Clinically clear no added sound  Discharge Instructions  Discharge Orders    Future Appointments: Provider: Department: Dept Phone: Center:   02/07/2012 2:00 PM Beatrice Lecher, PA  [facility] Main Office [place]) [phone] [id number]   03/23/2012 11:30 AM Vesta Mixer, MD [facility] Main Office [place]) [phone] [id number]     Future Orders Please Complete By Expires   Diet - low sodium heart healthy      Increase activity slowly      Call MD for:  temperature >100.4      Call MD for:  severe uncontrolled pain      Call MD for:  difficulty breathing, headache or visual disturbances      Call MD for:  extreme fatigue          Medication List     As of 01/28/2012 12:29 PM    TAKE these medications         amLODipine 5 MG tablet   Commonly known as: NORVASC   Take 1 tablet (5 mg total) by mouth daily.      aspirin 81 MG EC tablet   Take 1 tablet (81 mg total) by mouth daily.      calcium carbonate 500 MG chewable tablet   Commonly known as: TUMS - dosed in mg elemental calcium   Chew 1 tablet by mouth 2 (two) times daily.      carvedilol 12.5 MG tablet   Commonly known as: COREG   Take 12.5 mg by mouth 2 (two) times daily  with a meal.      CORICIDIN HBP COUGH/COLD PO   Take 1 tablet by mouth 2 (two) times daily.      feeding supplement Liqd   Take 1 Container by mouth 3 (three) times daily between meals.      furosemide 20 MG tablet   Commonly known as: LASIX   Take 10 mg by mouth every other day.      guaiFENesin 600 MG 12 hr tablet   Commonly known as: MUCINEX   Take 1 tablet (600 mg total) by mouth 2 (two) times daily.      guaiFENesin-dextromethorphan 100-10 MG/5ML syrup   Commonly known as: ROBITUSSIN DM   Take 5 mLs by mouth 3 (three) times daily as needed. For cough      ipratropium 0.02 % nebulizer solution   Commonly known as: ATROVENT   Take 2.5 mLs (0.5 mg total) by nebulization every 4 (four) hours as needed.      isosorbide dinitrate 10 MG tablet   Commonly known as: ISORDIL   Take 10 mg by mouth 2 (two) times daily.      levothyroxine 75 MCG tablet   Commonly known as: SYNTHROID, LEVOTHROID    Take 75 mcg by mouth daily.      Melatonin 3 MG Tabs   Take 6 mg by mouth at bedtime.      nitroGLYCERIN 0.4 MG SL tablet   Commonly known as: NITROSTAT   Place 1 tablet (0.4 mg total) under the tongue once.      pantoprazole 40 MG tablet   Commonly known as: PROTONIX   Take 40 mg by mouth daily.      potassium chloride 10 MEQ tablet   Commonly known as: K-DUR,KLOR-CON   Take 1 tablet (10 mEq total) by mouth daily.      temazepam 30 MG capsule   Commonly known as: RESTORIL   Take 30 mg by mouth at bedtime.      TYLENOL 500 MG tablet   Generic drug: acetaminophen   Take 500 mg by mouth as needed. Pain/fever           Follow-up Information    Follow up with [facility]. [facility] Nurse for Heart Failure Management and Home Health Aide)    Contact information:   [phone]          The results of significant diagnostics from this hospitalization (including imaging, microbiology, ancillary and laboratory) are listed below for reference.    Significant Diagnostic Studies: Dg Chest 2 View  01/21/2012  *RADIOLOGY REPORT*  Clinical Data: 78 year old female shortness of breath and respiratory distress.  CHEST - 2 VIEW  Comparison: 12/31/2011 and prior chest radiographs  Findings: Cardiomegaly is again noted. This is a low-volume film with pointy vascular congestion, small bilateral pleural effusions and interstitial opacities - likely representing pulmonary edema. Bibasilar opacities are present likely representing atelectasis but superimposed pneumonia is difficult to exclude. There is no evidence of pneumothorax or acute bony abnormality.  IMPRESSION: Low-volume film with pulmonary vascular congestion, probable interstitial pulmonary edema, small bilateral pleural effusions and bibasilar atelectasis.  Superimposed pneumonia is difficult to exclude.   Original Report Authenticated By: Harmon Pier, M.D.    Dg Chest Port 1 View  01/22/2012  *RADIOLOGY REPORT*  Clinical  Data: CHF.  PORTABLE CHEST - 1 VIEW  Comparison: 01/21/2012  Findings: Cardiomegaly.  Low lung volumes with bilateral airspace disease and probable layering effusions.  No acute bony abnormality.  No  change.  IMPRESSION: No significant change.   Original Report Authenticated By: Charlett Nose, M.D.     Microbiology: Recent Results (from the past 240 hour(s))  URINE CULTURE     Status: Normal   Collection Time   01/21/12  5:11 PM      Component Value Range Status Comment   Specimen Description URINE, CLEAN CATCH   Final    Special Requests NONE   Final    Culture  Setup Time 01/21/2012 19:03   Final    Colony Count NO GROWTH   Final    Culture NO GROWTH   Final    Report Status 01/22/2012 FINAL   Final   MRSA PCR SCREENING     Status: Normal   Collection Time   01/21/12  7:29 PM      Component Value Range Status Comment   MRSA by PCR NEGATIVE  NEGATIVE Final      Labs: Basic Metabolic Panel:  Lab 01/28/12 9604 01/27/12 0830 01/27/12 0515 01/26/12 1000 01/25/12 0550 01/21/12 1958  NA 139 138 140 140 139 --  K 3.8 4.7 5.5* 4.2 3.9 --  CL 98 97 100 95* 91* --  CO2 30 32 34* 33* 41* --  GLUCOSE 146* 151* 88 173* 79 --  BUN 27* 29* 32* 29* 27* --  CREATININE 1.57* 1.62* 1.83* 1.63* 1.36* --  CALCIUM 9.2 9.3 9.3 9.6 9.2 --  MG -- -- -- -- -- 2.0  PHOS -- -- 3.1 -- -- --   Liver Function Tests:  Lab 01/27/12 0515  AST --  ALT --  ALKPHOS --  BILITOT --  PROT --  ALBUMIN 3.0*   No results found for this basename: LIPASE:5,AMYLASE:5 in the last 168 hours No results found for this basename: AMMONIA:5 in the last 168 hours CBC:  Lab 01/22/12 0645 01/21/12 1320  WBC 6.6 4.5  NEUTROABS -- 3.7  HGB 12.2 11.7*  HCT 40.0 38.0  MCV 90.5 93.6  PLT 155 167   Cardiac Enzymes:  Lab 01/22/12 0550 01/21/12 2228 01/21/12 1629  CKTOTAL 55 46 53  CKMB 3.1 2.5 2.5  CKMBINDEX -- -- --  TROPONINI <0.30 <0.30 <0.30   BNP: BNP (last 3 results)  Basename 01/25/12 0550 01/22/12 0550  01/21/12 1320  PROBNP 4459.0* 28193.0* 12008.0*   CBG:  Lab 01/23/12 2131  GLUCAP 92       Signed:  Rhetta Mura  [facility] 01/28/2012, 12:29 PM

## 2012-01-28 NOTE — Progress Notes (Signed)
Patient: [name] / Admit Date: 01/21/2012 / Date of Encounter: 01/28/2012, 8:00 AM   Subjective  Feels well. No complaints. No CP or SOB.  Her BP actually dropped yesterday.  Lasix has been on hold.  She has had some diarrhea.  Hydralazine was stopped.    Objective   Telemetry: NSR occ PVC, PAC Physical Exam: Filed Vitals:   01/28/12 0657  BP: 115/58  Pulse: 80  Temp: 97.8 F (36.6 C)  Resp: 20   General: Thin elderly AAF in no acute distress. Head: Normocephalic, atraumatic, sclera non-icteric, no xanthomas, nares are without discharge. Neck: JVD not elevated. Lungs:  Rhonchi bilaterally, no wheezing today. Heart: RRR S1 S2 with 3/6 SEM heard best at LLSB. Abdomen: Soft, non-tender, non-distended with normoactive bowel sounds. No hepatomegaly. No rebound/guarding. No obvious abdominal masses. Msk:  Strength and tone appear normal for age. Extremities: No clubbing or cyanosis. No edema.  Distal pedal pulses are 2+ and equal bilaterally. Decreased skin turgor.  Neuro: Alert and oriented X 3. Moves all extremities spontaneously. Psych:  Responds to questions appropriately with a normal affect.    Intake/Output Summary (Last 24 hours) at 01/28/12 0800 Last data filed at 01/28/12 0558  Gross per 24 hour  Intake    483 ml  Output    150 ml  Net    333 ml    Inpatient Medications:     . amLODipine  5 mg Oral Daily  . aspirin EC  81 mg Oral Daily  . calcium carbonate  1 tablet Oral BID  . carvedilol  12.5 mg Oral BID WC  . feeding supplement  1 Container Oral TID BM  . guaiFENesin  600 mg Oral BID  . isosorbide dinitrate  10 mg Oral BID  . levothyroxine  75 mcg Oral QAC breakfast  . nitroGLYCERIN  0.4 mg Sublingual Once  . pantoprazole  40 mg Oral Daily  . sodium chloride  3 mL Intravenous Q12H  . temazepam  30 mg Oral QHS    Labs:  Basename 01/27/12 0830 01/27/12 0515  NA 138 140  K 4.7 5.5*  CL 97 100  CO2 32 34*  GLUCOSE 151* 88  BUN 29* 32*    CREATININE 1.62* 1.83*  CALCIUM 9.3 9.3  MG -- --  PHOS -- 3.1    Radiology/Studies:  Dg Chest 2 View  01/21/2012  *RADIOLOGY REPORT*  Clinical Data: 78 year old female shortness of breath and respiratory distress.  CHEST - 2 VIEW  Comparison: 12/31/2011 and prior chest radiographs  Findings: Cardiomegaly is again noted. This is a low-volume film with pointy vascular congestion, small bilateral pleural effusions and interstitial opacities - likely representing pulmonary edema. Bibasilar opacities are present likely representing atelectasis but superimposed pneumonia is difficult to exclude. There is no evidence of pneumothorax or acute bony abnormality.  IMPRESSION: Low-volume film with pulmonary vascular congestion, probable interstitial pulmonary edema, small bilateral pleural effusions and bibasilar atelectasis.  Superimposed pneumonia is difficult to exclude.   Original Report Authenticated By: Harmon Pier, M.D.    Dg Chest Port 1 View  01/22/2012  *RADIOLOGY REPORT*  Clinical Data: CHF.  PORTABLE CHEST - 1 VIEW  Comparison: 01/21/2012  Findings: Cardiomegaly.  Low lung volumes with bilateral airspace disease and probable layering effusions.  No acute bony abnormality.  No change.  IMPRESSION: No significant change.   Original Report Authenticated By: Charlett Nose, M.D.      Assessment and Plan  1. Acute respiratory failure- due to #  2. Resolved.  2. Hypertensive emergency- with resultant flash pulmonary edema. Resolved.  I would actually prefer Hydralazine over Amlodipine .  I will decrease amlodipine to 5 mg a day.  We can add Hydralazine if / when her BP increases.  I think her volume depletion is contributing to her lower than normal BP. 3. Acute on chronic predominantly diastolic CHF- weight down 18lbs, -4200 L. Lasix held 2/2 contraction alkalosis - timing of resumption per MD.  4. Pulmonary hypertension- question secondary to longstanding left-sided diastolic dysfunction.  5.  Hypertension, uncontrolled- improved. Hydralazine added yesterday.  6. A/CKD, stage III- improving. Lasix held.  7. CAD - troponin negative on admission. Stable.     Vesta Mixer, Montez Hageman., MD, [facility] 01/28/2012, 8:00 AM

## 2012-01-28 NOTE — Progress Notes (Signed)
DC IV, DC Home, DC Tele. Discharge instructions and home medications discussed with patient and patient's family. Patient and family denied any questions or concerns at this time. Patient leaving unit via wheelchair and appears in no acute distress. 

## 2012-01-28 NOTE — Progress Notes (Signed)
Physical Therapy Treatment Patient Details Name: [name] MRN: [id number] DOB: February 12, 1916 Today's Date: 01/28/2012 Time: 1027-1059 PT Time Calculation (min): 32 min  PT Assessment / Plan / Recommendation Comments on Treatment Session  Pt. admitted with SOB, pulmonary edema, and CHF; Pt. continues to progress with ambulation but requires increased time; able to ambulate 400' and did not complain of fatigue. Pts. daughter present and very willing to help mother with mobility. Pt. on room air during session and O2 above 94% throughout.    Follow Up Recommendations  Home health PT           Equipment Recommendations  None recommended by PT       Frequency Min 3X/week   Plan Discharge plan remains appropriate;Frequency remains appropriate    Precautions / Restrictions Precautions Precautions: Fall Restrictions Weight Bearing Restrictions: No   Pertinent Vitals/Pain HR 103 at the highest O2 94% or above on room air throughout session BP 164/86    Mobility  Bed Mobility Supine to Sit: HOB elevated;6: Modified independent (Device/Increase time) (20 degrees) Sitting - Scoot to Edge of Bed: 6: Modified independent (Device/Increase time) Transfers Sit to Stand: 4: Min guard;From bed Stand to Sit: 4: Min guard;To chair/3-in-1 Details for Transfer Assistance: Cueing for pt. to stand upright and for hand placement. Ambulation/Gait Ambulation/Gait Assistance: 4: Min guard Ambulation Distance (Feet): 400 Feet Assistive device: Rolling walker Ambulation/Gait Assistance Details: Pt. required cueing to stand upright, stay inside RW, and to hold head up while walking.  Gait Pattern: Trunk flexed;Decreased stride length;Step-to pattern Gait velocity: decreased    Exercises General Exercises - Lower Extremity Long Arc Quad: AROM;Both;10 reps Hip Flexion/Marching: AROM;Both;10 reps     PT Goals Acute Rehab PT Goals PT Goal: Sit to Stand - Progress: Progressing toward goal PT  Goal: Stand to Sit - Progress: Progressing toward goal PT Goal: Ambulate - Progress: Progressing toward goal PT Goal: Perform Home Exercise Program - Progress: Progressing toward goal  Visit Information  Last PT Received On: 01/28/12 Assistance Needed: +1    Subjective Data  Subjective: "I can count in [place]."   Cognition  Overall Cognitive Status: Appears within functional limits for tasks assessed/performed Arousal/Alertness: Awake/alert Orientation Level: Appears intact for tasks assessed Behavior During Session: [facility] for tasks performed       End of Session PT - End of Session Equipment Utilized During Treatment: Gait belt Activity Tolerance: Patient tolerated treatment well Patient left: in chair;with call bell/phone within reach;with family/visitor present (Daughter present) Nurse Communication: Mobility status     Army [place]aco SPT 01/28/2012, 11:48 AM

## 2012-02-07 ENCOUNTER — Ambulatory Visit: Payer: Medicare Other | Admitting: Physician Assistant

## 2012-02-22 ENCOUNTER — Ambulatory Visit: Payer: Medicare Other | Admitting: Physician Assistant

## 2012-03-23 ENCOUNTER — Ambulatory Visit: Payer: Medicare Other | Admitting: Cardiovascular Disease

## 2012-04-02 ENCOUNTER — Emergency Department (HOSPITAL_COMMUNITY)
Admission: EM | Admit: 2012-04-02 | Discharge: 2012-04-02 | Disposition: A | Discharge status: Home / Self Care | Payer: Medicare Other | Attending: Emergency Medicine | Admitting: Emergency Medicine

## 2012-04-02 ENCOUNTER — Encounter (HOSPITAL_COMMUNITY): Payer: Self-pay | Admitting: Nurse Practitioner

## 2012-04-02 ENCOUNTER — Emergency Department (HOSPITAL_COMMUNITY): Payer: Medicare Other

## 2012-04-02 DIAGNOSIS — N189 Chronic kidney disease, unspecified: Secondary | ICD-10-CM | POA: Insufficient documentation

## 2012-04-02 DIAGNOSIS — Z9861 Coronary angioplasty status: Secondary | ICD-10-CM | POA: Insufficient documentation

## 2012-04-02 DIAGNOSIS — Z8701 Personal history of pneumonia (recurrent): Secondary | ICD-10-CM | POA: Insufficient documentation

## 2012-04-02 DIAGNOSIS — R609 Edema, unspecified: Secondary | ICD-10-CM | POA: Insufficient documentation

## 2012-04-02 DIAGNOSIS — I509 Heart failure, unspecified: Secondary | ICD-10-CM | POA: Insufficient documentation

## 2012-04-02 DIAGNOSIS — Z8739 Personal history of other diseases of the musculoskeletal system and connective tissue: Secondary | ICD-10-CM | POA: Insufficient documentation

## 2012-04-02 DIAGNOSIS — I129 Hypertensive chronic kidney disease with stage 1 through stage 4 chronic kidney disease, or unspecified chronic kidney disease: Secondary | ICD-10-CM | POA: Insufficient documentation

## 2012-04-02 DIAGNOSIS — Z8679 Personal history of other diseases of the circulatory system: Secondary | ICD-10-CM | POA: Insufficient documentation

## 2012-04-02 DIAGNOSIS — I2510 Atherosclerotic heart disease of native coronary artery without angina pectoris: Secondary | ICD-10-CM | POA: Insufficient documentation

## 2012-04-02 DIAGNOSIS — Z8639 Personal history of other endocrine, nutritional and metabolic disease: Secondary | ICD-10-CM | POA: Insufficient documentation

## 2012-04-02 DIAGNOSIS — I10 Essential (primary) hypertension: Secondary | ICD-10-CM

## 2012-04-02 DIAGNOSIS — Z85828 Personal history of other malignant neoplasm of skin: Secondary | ICD-10-CM | POA: Insufficient documentation

## 2012-04-02 DIAGNOSIS — Z79899 Other long term (current) drug therapy: Secondary | ICD-10-CM | POA: Insufficient documentation

## 2012-04-02 DIAGNOSIS — Z7982 Long term (current) use of aspirin: Secondary | ICD-10-CM | POA: Insufficient documentation

## 2012-04-02 DIAGNOSIS — Z8673 Personal history of transient ischemic attack (TIA), and cerebral infarction without residual deficits: Secondary | ICD-10-CM | POA: Insufficient documentation

## 2012-04-02 DIAGNOSIS — Z862 Personal history of diseases of the blood and blood-forming organs and certain disorders involving the immune mechanism: Secondary | ICD-10-CM | POA: Insufficient documentation

## 2012-04-02 DIAGNOSIS — E039 Hypothyroidism, unspecified: Secondary | ICD-10-CM | POA: Insufficient documentation

## 2012-04-02 LAB — BASIC METABOLIC PANEL
GFR calc Af Amer: 43 mL/min — ABNORMAL LOW (ref >90)
GFR calc non Af Amer: 37 mL/min — ABNORMAL LOW (ref >90)
Glucose, Bld: 128 mg/dL — ABNORMAL HIGH (ref 70–99)
Potassium: 4.6 mEq/L (ref 3.5–5.1)
Sodium: 137 mEq/L (ref 135–145)

## 2012-04-02 LAB — POCT I-STAT TROPONIN I: Troponin i, poc: 0.01 ng/mL (ref 0.00–0.08)

## 2012-04-02 LAB — CBC
Platelets: 196 10*3/uL (ref 150–400)
RBC: 4.28 MIL/uL (ref 3.87–5.11)
WBC: 5.4 10*3/uL (ref 4.0–10.5)

## 2012-04-02 MED ORDER — AMLODIPINE BESYLATE 5 MG PO TABS
5.0000 mg | ORAL_TABLET | Freq: Once | ORAL | Status: AC
  Administered 2012-04-02: 5 mg via ORAL
  Filled 2012-04-02: qty 1

## 2012-04-02 MED ORDER — AMLODIPINE BESYLATE 5 MG PO TABS: 5.0000 mg | ORAL_TABLET | Freq: Every day | ORAL | Status: AC

## 2012-04-02 MED ORDER — FUROSEMIDE 10 MG/ML IJ SOLN
20.0000 mg | Freq: Once | INTRAMUSCULAR | Status: DC
  Filled 2012-04-02: qty 2

## 2012-04-02 MED ORDER — FUROSEMIDE 10 MG/ML IJ SOLN
20.0000 mg | Freq: Once | INTRAMUSCULAR | Status: AC
  Administered 2012-04-02: 20 mg via INTRAMUSCULAR

## 2012-04-02 NOTE — ED Notes (Signed)
Pt transported to xray 

## 2012-04-02 NOTE — ED Provider Notes (Signed)
History     CSN: [id number]  Arrival date & time 04/02/12  1804   First MD Initiated Contact with Patient 04/02/12 1923      Chief Complaint  Patient presents with  . Hypertension    (Consider location/radiation/quality/duration/timing/severity/associated sxs/prior treatment) Patient is a 79 y.o. female presenting with hypertension. The history is provided by the patient and a relative.  Hypertension  She apparently ran out of her amlodipine sometime in the last week. A home health agency that is monitoring her blood pressure noted that her blood pressures were running very high today and advised that she come into the hospital. She also had an 8 pound weight gain. She denies headache, tinnitus, chest pain, heaviness, tightness, pressure. She denies dyspnea. Her medication is supposed to come by mail and do not exactly when it will arrive.  Past Medical History  Diagnosis Date  . Hyperlipidemia   . Hypertension   . Chronic kidney disease   . Hypothyroidism   . Cardiomegaly   . CHF (congestive heart failure)     Recent hospitalization for CHF & pneumonia -- EF is 40-45%  ( 03/16/10 by Dr. Peter [place] )    . Shortness of breath   . Dyspnea   . Stroke     history of   . History of rheumatic fever   . Head and neck cancer     status post radiation therapy  . Coronary artery disease     EF of 40-45% with history of angioplasty  . Pulmonary hypertension     with PA pressure estimated at 53 mmHg  . Pulmonary edema   . Hypoxia   . Aortic insufficiency     moderate-severe  . Arthritis   . Pneumonia     hx of  . Anemia   . CKD (chronic kidney disease) 01/21/2012  . Complication of anesthesia     wakes up slowly    Past Surgical History  Procedure Laterality Date  . Tonsillectomy    . Thyroidectomy, partial    . Hammer toe surgery    . Refractive surgery      both eyes  . Coronary angioplasty      with angioplasty  . Joint replacement      bilat  . Knee surgery       bilat    Family History  Problem Relation Age of Onset  . Stroke Father   . Ulcers Father   . Asthma Mother   . Cancer Brother   . Cancer Brother   . Cancer Brother   . Dementia Sister   . Congenital heart disease Sister   . Hypertension Son   . Hypertension Son   . Hypertension Son   . Hypertension Son   . Hypertension Son   . Hypertension Son   . Hypertension Son   . Hypertension Daughter   . Hypertension Daughter   . Hypertension Daughter     History  Substance Use Topics  . Smoking status: Never Smoker   . Smokeless tobacco: Never Used  . Alcohol Use: No    OB History   Grav Para Term Preterm Abortions TAB SAB Ect Mult Living                  Review of Systems  All other systems reviewed and are negative.    Allergies  Demerol; Doxycycline; Enoxaparin sodium; Hydrocodone-acetaminophen; Loratadine; Oxycodone-acetaminophen; and Red dye  Home Medications   Current Outpatient Rx  Name  Route  Sig  Dispense  Refill  . acetaminophen (TYLENOL) 500 MG tablet   Oral   Take 500 mg by mouth as needed. Pain/fever         . amLODipine (NORVASC) 5 MG tablet   Oral   Take 1 tablet (5 mg total) by mouth daily.   30 tablet   0   . aspirin EC 81 MG EC tablet   Oral   Take 1 tablet (81 mg total) by mouth daily.   30 tablet      . calcium carbonate (TUMS - DOSED IN MG ELEMENTAL CALCIUM) 500 MG chewable tablet   Oral   Chew 3 tablets by mouth daily.          . carvedilol (COREG) 12.5 MG tablet   Oral   Take 12.5 mg by mouth 2 (two) times daily with a meal.         . Ensure Plus (ENSURE PLUS) LIQD   Oral   Take 237 mLs by mouth daily.         . furosemide (LASIX) 20 MG tablet   Oral   Take 10 mg by mouth every other day.         . isosorbide dinitrate (ISORDIL) 10 MG tablet   Oral   Take 10 mg by mouth 2 (two) times daily.          Marland Kitchen levothyroxine (SYNTHROID, LEVOTHROID) 75 MCG tablet   Oral   Take 75 mcg by mouth daily.            . Melatonin 3 MG TABS   Oral   Take 6 mg by mouth at bedtime.          . nitroGLYCERIN (NITROSTAT) 0.4 MG SL tablet   Sublingual   Place 0.4 mg under the tongue every 5 (five) minutes as needed. x3 doses as needed for chest pain         . pantoprazole (PROTONIX) 40 MG tablet   Oral   Take 40 mg by mouth daily.         . potassium chloride (K-DUR,KLOR-CON) 10 MEQ tablet   Oral   Take 1 tablet (10 mEq total) by mouth daily.   30 tablet   5   . temazepam (RESTORIL) 30 MG capsule   Oral   Take 30 mg by mouth at bedtime.             BP 204/62  Pulse 74  Temp(Src) 98.3 F (36.8 C) (Oral)  Resp 18  SpO2 95%  Physical Exam  Nursing note and vitals reviewed.  79 year old female, resting comfortably and in no acute distress. Vital signs are significant for hypertension with blood pressure 204/62. However, blood pressure monitor in the room has shown pressures varying from 165 systolic to 200 systolic. Oxygen saturation is 95%, which is normal. Head is normocephalic and atraumatic. PERRLA, EOMI. Oropharynx is clear. Neck is nontender and supple without adenopathy or JVD. Back is nontender and there is no CVA tenderness. Lungs are clear without rales, wheezes, or rhonchi. Chest is nontender. Heart has regular rate and rhythm without murmur. Abdomen is soft, flat, nontender without masses or hepatosplenomegaly and peristalsis is normoactive. Extremities have 1+ edema, full range of motion is present. Skin is warm and dry without rash. Neurologic: She is awake and alert, cranial nerves are intact, there are no motor or sensory deficits.  ED Course  Procedures (including critical care time)  Results for orders  placed during the hospital encounter of 04/02/12  BASIC METABOLIC PANEL      Result Value Range   Sodium 137  135 - 145 mEq/L   Potassium 4.6  3.5 - 5.1 mEq/L   Chloride 101  96 - 112 mEq/L   CO2 27  19 - 32 mEq/L   Glucose, Bld 128 (*) 70 - 99 mg/dL   BUN  34 (*) 6 - 23 mg/dL   Creatinine, Ser 0.98 (*) 0.50 - 1.10 mg/dL   Calcium 9.7  8.4 - 11.9 mg/dL   GFR calc non Af Amer 37 (*) >90 mL/min   GFR calc Af Amer 43 (*) >90 mL/min  PRO B NATRIURETIC PEPTIDE      Result Value Range   Pro B Natriuretic peptide (BNP) 1761.0 (*) 0 - 450 pg/mL  CBC      Result Value Range   WBC 5.4  4.0 - 10.5 K/uL   RBC 4.28  3.87 - 5.11 MIL/uL   Hemoglobin 12.4  12.0 - 15.0 g/dL   HCT 14.7  82.9 - 56.2 %   MCV 87.6  78.0 - 100.0 fL   MCH 29.0  26.0 - 34.0 pg   MCHC 33.1  30.0 - 36.0 g/dL   RDW 13.0 (*) 86.5 - 78.4 %   Platelets 196  150 - 400 K/uL  POCT I-STAT TROPONIN I      Result Value Range   Troponin i, poc 0.01  0.00 - 0.08 ng/mL   Comment 3            Dg Chest 2 View  04/02/2012  *RADIOLOGY REPORT*  Clinical Data: Hypertension.  Cardiomegaly.  CHF.  CHEST - 2 VIEW  Comparison: 01/22/2012.  Findings: Apical lordotic projection.  Heart size is probably mildly enlarged allowing for the projection.  There is no airspace disease.  No effusion.  Chronic bilateral rotator cuff tears with high-riding humeral heads.  IMPRESSION: Cardiomegaly without failure.  Apical lordotic projection.   Original Report Authenticated By: Andreas Newport, M.D.     Date: 04/02/2012  Rate: 78  Rhythm: normal sinus rhythm  QRS Axis: right  Intervals: normal  ST/T Wave abnormalities: nonspecific ST/T changes  Conduction Disutrbances:none  Narrative Interpretation: Right axis deviation, old anteroseptal myocardial infarction, old lateral wall myocardial infarction, left ventricular hypertrophy with secondary repolarization changes. Compared with ECG of 01/21/2012, axis has shifted rightward.  Old EKG Reviewed: changes noted    1. Hypertension   2. Peripheral edema       MDM  Hypertension related to fluid overload and medication noncompliance. Do not see any evidence of hypertensive urgency or crisis, so she will continue to be sent home. She's given a dose of furosemide  here to help with her fluid overload and given initial dose of famotidine. She's given a prescription for moderate pain to last her until her mail order medication comes in. Old records are reviewed and she did have her recent hospitalizations for malignant hypertension but there is no evidence of malignant hypertension today. Renal function is unchanged from baseline and BNP is improved compared with the last value on record.        Dione Booze, MD 04/02/12 2013

## 2012-04-02 NOTE — ED Notes (Addendum)
Home health called pt today and told her that her BP was reading high on their monitor, nurse came to house and check and manual BP was 230/160. Pt denies pain or other complaints now. Family states she has seemed to be harder of hearing over pst few days. Pt taking BP meds at home as prescribed. Family reprots pt gained 8 lbs since 2 days ago on scale at home

## 2012-04-14 ENCOUNTER — Encounter: Payer: Self-pay | Admitting: Cardiovascular Disease

## 2012-04-14 ENCOUNTER — Ambulatory Visit (INDEPENDENT_AMBULATORY_CARE_PROVIDER_SITE_OTHER): Payer: Medicare Other | Admitting: Cardiovascular Disease

## 2012-04-14 VITALS — BP 190/100 | HR 76 | Ht 60.0 in | Wt 124.8 lb

## 2012-04-14 DIAGNOSIS — I509 Heart failure, unspecified: Secondary | ICD-10-CM

## 2012-04-14 DIAGNOSIS — I5033 Acute on chronic diastolic (congestive) heart failure: Secondary | ICD-10-CM

## 2012-04-14 NOTE — Progress Notes (Signed)
[name] Date of Birth  1915/02/21       [facility]    [place] [address]  [address] [place], [place]  [address]   [place], [place]  [address] [phone]     [phone]   Fax  [phone]    Fax [phone]  Problem List: 1. Hypertension 2. CHF 3. Hypothyroidism 4. Aortic insufficiency 5. Pulmonary Hypertension  History of Present Illness:  [name] is a 79 year old female with a history of congestive heart failure, aortic stenosis, hypothyroidism, and hypertension. She presents today after being diagnosed with mild congestive heart failure after she went to her general medical doctors office.  She has been eating some of cream of  chicken soup. She has problems with her esophagus and has bleeding of her esophagus that she eats food that is too rough. She finds that the chicken soup goes on fairly well. She's also had some mild shortness of breath recently. She also describes some back pain and some left arm discomfort.  She was hospitalized in August with dyspnea.  She had some renal insufficiency and her Lasix dose was lowered.  Her daughter is giving her Lasix 10 mg every other day.  Feb. 28, 2014:  [name] is doing well.  She has her BP has been well controlled.  She's been having some problems with irritation from the nasal cannula oxygen. Her oxygen levels have been quite good and have been around 95% most of the time. her daughter was wondering whether we could stop the oxygen.  She has been breathing better.  [facility] checks her blood pressure and her O2 sats on a daily basis.  Current Outpatient Prescriptions on File Prior to Visit  Medication Sig Dispense Refill  . acetaminophen (TYLENOL) 500 MG tablet Take 500 mg by mouth as needed. Pain/fever      . amLODipine (NORVASC) 5 MG tablet Take 1 tablet (5 mg total) by mouth daily.  10 tablet  0  . aspirin EC 81 MG EC tablet Take 1 tablet (81 mg  total) by mouth daily.  30 tablet    . calcium carbonate (TUMS - DOSED IN MG ELEMENTAL CALCIUM) 500 MG chewable tablet Chew 3 tablets by mouth daily.       . carvedilol (COREG) 12.5 MG tablet Take 12.5 mg by mouth 2 (two) times daily with a meal.      . Ensure Plus (ENSURE PLUS) LIQD Take 237 mLs by mouth daily.      . furosemide (LASIX) 20 MG tablet Take 10 mg by mouth every other day.      . isosorbide dinitrate (ISORDIL) 10 MG tablet Take 10 mg by mouth 2 (two) times daily.       Marland Kitchen levothyroxine (SYNTHROID, LEVOTHROID) 75 MCG tablet Take 75 mcg by mouth daily.        . Melatonin 3 MG TABS Take 6 mg by mouth at bedtime.       . nitroGLYCERIN (NITROSTAT) 0.4 MG SL tablet Place 0.4 mg under the tongue every 5 (five) minutes as needed. x3 doses as needed for chest pain      . pantoprazole (PROTONIX) 40 MG tablet Take 40 mg by mouth daily.      . potassium chloride (K-DUR,KLOR-CON) 10 MEQ tablet Take 1 tablet (10 mEq total) by mouth daily.  30 tablet  5  . temazepam (RESTORIL) 30 MG capsule Take 30 mg by mouth at  bedtime.         No current facility-administered medications on file prior to visit.    Allergies  Allergen Reactions  . Demerol (Meperidine)     hallucinations  . Doxycycline Hives and Nausea And Vomiting    Headaches   . Enoxaparin Sodium Other (See Comments)    unknown  . Hydrocodone-Acetaminophen Other (See Comments)    Syncope when taken with temazepam  . Loratadine Other (See Comments)    Clariting D-syncope  . Oxycodone-Acetaminophen Itching  . Red Dye Other (See Comments)    Affects veins in 1987    Past Medical History  Diagnosis Date  . Hyperlipidemia   . Hypertension   . Chronic kidney disease   . Hypothyroidism   . Cardiomegaly   . CHF (congestive heart failure)     Recent hospitalization for CHF & pneumonia -- EF is 40-45%  ( 03/16/10 by Dr. Peter [place] )    . Shortness of breath   . Dyspnea   . Stroke     history of   . History of rheumatic fever    . Head and neck cancer     status post radiation therapy  . Coronary artery disease     EF of 40-45% with history of angioplasty  . Pulmonary hypertension     with PA pressure estimated at 53 mmHg  . Pulmonary edema   . Hypoxia   . Aortic insufficiency     moderate-severe  . Arthritis   . Pneumonia     hx of  . Anemia   . CKD (chronic kidney disease) 01/21/2012  . Complication of anesthesia     wakes up slowly    Past Surgical History  Procedure Laterality Date  . Tonsillectomy    . Thyroidectomy, partial    . Hammer toe surgery    . Refractive surgery      both eyes  . Coronary angioplasty      with angioplasty  . Joint replacement      bilat  . Knee surgery      bilat    History  Smoking status  . Never Smoker   Smokeless tobacco  . Never Used    History  Alcohol Use No    Family History  Problem Relation Age of Onset  . Stroke Father   . Ulcers Father   . Asthma Mother   . Cancer Brother   . Cancer Brother   . Cancer Brother   . Dementia Sister   . Congenital heart disease Sister   . Hypertension Son   . Hypertension Son   . Hypertension Son   . Hypertension Son   . Hypertension Son   . Hypertension Son   . Hypertension Son   . Hypertension Daughter   . Hypertension Daughter   . Hypertension Daughter     Reviw of Systems:  Reviewed in the HPI.  All other systems are negative.  Physical Exam: Blood pressure 190/100, pulse 76, height 5' (1.524 m), weight 124 lb 12.8 oz (56.609 kg), SpO2 98.00%. General: Well developed, well nourished, in no acute distress.  She was examined in the wheelchair.  Head: Normocephalic, atraumatic, sclera non-icteric, mucus membranes are moist,   Neck: Supple. Carotids are 2 + without bruits. No JVD  Lungs: Mostly clear.  Heart: regular rate.  normal  S1 S2. 2-3 / 6 systolic murmur.  There is a 1-2/6 diastolic murmur. Abdomen: Soft, non-tender, non-distended with normal bowel sounds. No hepatomegaly.  No  rebound/guarding. No masses.  Msk:  Strength and tone are normal  Extremities: No clubbing or cyanosis. trace edema.  Distal pedal pulses are 2+ and equal bilaterally.  Neuro: Alert and oriented X 3. Moves all extremities spontaneously.  Psych:  Responds to questions appropriately with a normal affect.  ECG:  Assessment / Plan:

## 2012-04-14 NOTE — Patient Instructions (Addendum)
Your physician wants you to follow-up in: 6 months  You will receive a reminder letter in the mail two months in advance. If you don't receive a letter, please call our office to schedule the follow-up appointment.  Your physician recommends that you continue on your current medications as directed. Please refer to the Current Medication list given to you today.  

## 2012-04-14 NOTE — Assessment & Plan Note (Signed)
Mrs. Wish seems to be doing well. She is trying to stay away from eating too much salt. She seems to be breathing better. Her oxygen saturation levels have been normal around 95% . She was wondering whether she needs the home oxygen.  I told her that she did not need oxygen as long as her oxygen saturation level stays above 90% on room air. She'll discuss this with him to come here. When they get in touch with [place] will cancel the order. She wants to make sure that she can maintain adequate oxygen saturation on room air before she turns in her oxygen. I think this is reasonable.

## 2012-06-07 ENCOUNTER — Encounter (HOSPITAL_COMMUNITY): Payer: Self-pay

## 2012-06-07 ENCOUNTER — Observation Stay (HOSPITAL_COMMUNITY)
Admission: EM | Admit: 2012-06-07 | Discharge: 2012-06-09 | Disposition: A | Discharge status: Home / Self Care | Payer: Medicare Other | Attending: Internal Medicine | Admitting: Internal Medicine

## 2012-06-07 ENCOUNTER — Emergency Department (HOSPITAL_COMMUNITY): Payer: Medicare Other

## 2012-06-07 DIAGNOSIS — X58XXXA Exposure to other specified factors, initial encounter: Secondary | ICD-10-CM | POA: Insufficient documentation

## 2012-06-07 DIAGNOSIS — R32 Unspecified urinary incontinence: Secondary | ICD-10-CM

## 2012-06-07 DIAGNOSIS — R1310 Dysphagia, unspecified: Secondary | ICD-10-CM | POA: Insufficient documentation

## 2012-06-07 DIAGNOSIS — R531 Weakness: Secondary | ICD-10-CM | POA: Diagnosis present

## 2012-06-07 DIAGNOSIS — Q248 Other specified congenital malformations of heart: Secondary | ICD-10-CM | POA: Insufficient documentation

## 2012-06-07 DIAGNOSIS — C801 Malignant (primary) neoplasm, unspecified: Secondary | ICD-10-CM

## 2012-06-07 DIAGNOSIS — I10 Essential (primary) hypertension: Secondary | ICD-10-CM

## 2012-06-07 DIAGNOSIS — K222 Esophageal obstruction: Secondary | ICD-10-CM

## 2012-06-07 DIAGNOSIS — D509 Iron deficiency anemia, unspecified: Secondary | ICD-10-CM | POA: Insufficient documentation

## 2012-06-07 DIAGNOSIS — M545 Low back pain, unspecified: Secondary | ICD-10-CM | POA: Insufficient documentation

## 2012-06-07 DIAGNOSIS — I6350 Cerebral infarction due to unspecified occlusion or stenosis of unspecified cerebral artery: Secondary | ICD-10-CM

## 2012-06-07 DIAGNOSIS — R6510 Systemic inflammatory response syndrome (SIRS) of non-infectious origin without acute organ dysfunction: Principal | ICD-10-CM

## 2012-06-07 DIAGNOSIS — J40 Bronchitis, not specified as acute or chronic: Secondary | ICD-10-CM

## 2012-06-07 DIAGNOSIS — I498 Other specified cardiac arrhythmias: Secondary | ICD-10-CM | POA: Insufficient documentation

## 2012-06-07 DIAGNOSIS — R269 Unspecified abnormalities of gait and mobility: Secondary | ICD-10-CM

## 2012-06-07 DIAGNOSIS — D638 Anemia in other chronic diseases classified elsewhere: Secondary | ICD-10-CM | POA: Insufficient documentation

## 2012-06-07 DIAGNOSIS — D72829 Elevated white blood cell count, unspecified: Secondary | ICD-10-CM

## 2012-06-07 DIAGNOSIS — E039 Hypothyroidism, unspecified: Secondary | ICD-10-CM

## 2012-06-07 DIAGNOSIS — Q228 Other congenital malformations of tricuspid valve: Secondary | ICD-10-CM

## 2012-06-07 DIAGNOSIS — N184 Chronic kidney disease, stage 4 (severe): Secondary | ICD-10-CM | POA: Insufficient documentation

## 2012-06-07 DIAGNOSIS — I129 Hypertensive chronic kidney disease with stage 1 through stage 4 chronic kidney disease, or unspecified chronic kidney disease: Secondary | ICD-10-CM | POA: Insufficient documentation

## 2012-06-07 DIAGNOSIS — K219 Gastro-esophageal reflux disease without esophagitis: Secondary | ICD-10-CM

## 2012-06-07 DIAGNOSIS — I5033 Acute on chronic diastolic (congestive) heart failure: Secondary | ICD-10-CM

## 2012-06-07 DIAGNOSIS — Z8673 Personal history of transient ischemic attack (TIA), and cerebral infarction without residual deficits: Secondary | ICD-10-CM | POA: Insufficient documentation

## 2012-06-07 DIAGNOSIS — Z8679 Personal history of other diseases of the circulatory system: Secondary | ICD-10-CM

## 2012-06-07 DIAGNOSIS — I2789 Other specified pulmonary heart diseases: Secondary | ICD-10-CM | POA: Insufficient documentation

## 2012-06-07 DIAGNOSIS — K5730 Diverticulosis of large intestine without perforation or abscess without bleeding: Secondary | ICD-10-CM

## 2012-06-07 DIAGNOSIS — I272 Pulmonary hypertension, unspecified: Secondary | ICD-10-CM

## 2012-06-07 DIAGNOSIS — S93409A Sprain of unspecified ligament of unspecified ankle, initial encounter: Secondary | ICD-10-CM | POA: Insufficient documentation

## 2012-06-07 DIAGNOSIS — E785 Hyperlipidemia, unspecified: Secondary | ICD-10-CM | POA: Insufficient documentation

## 2012-06-07 DIAGNOSIS — I2510 Atherosclerotic heart disease of native coronary artery without angina pectoris: Secondary | ICD-10-CM | POA: Insufficient documentation

## 2012-06-07 DIAGNOSIS — J309 Allergic rhinitis, unspecified: Secondary | ICD-10-CM

## 2012-06-07 DIAGNOSIS — R5381 Other malaise: Secondary | ICD-10-CM | POA: Insufficient documentation

## 2012-06-07 DIAGNOSIS — J180 Bronchopneumonia, unspecified organism: Secondary | ICD-10-CM | POA: Insufficient documentation

## 2012-06-07 DIAGNOSIS — R1319 Other dysphagia: Secondary | ICD-10-CM

## 2012-06-07 DIAGNOSIS — N189 Chronic kidney disease, unspecified: Secondary | ICD-10-CM

## 2012-06-07 DIAGNOSIS — I509 Heart failure, unspecified: Secondary | ICD-10-CM

## 2012-06-07 DIAGNOSIS — R630 Anorexia: Secondary | ICD-10-CM | POA: Insufficient documentation

## 2012-06-07 DIAGNOSIS — R823 Hemoglobinuria: Secondary | ICD-10-CM | POA: Diagnosis present

## 2012-06-07 DIAGNOSIS — R29898 Other symptoms and signs involving the musculoskeletal system: Secondary | ICD-10-CM

## 2012-06-07 DIAGNOSIS — I499 Cardiac arrhythmia, unspecified: Secondary | ICD-10-CM

## 2012-06-07 DIAGNOSIS — Z85819 Personal history of malignant neoplasm of unspecified site of lip, oral cavity, and pharynx: Secondary | ICD-10-CM | POA: Insufficient documentation

## 2012-06-07 HISTORY — DX: Malignant neoplasm of pharynx, unspecified: C14.0

## 2012-06-07 HISTORY — DX: Systemic inflammatory response syndrome (sirs) of non-infectious origin without acute organ dysfunction: R65.10

## 2012-06-07 HISTORY — DX: Gastro-esophageal reflux disease without esophagitis: K21.9

## 2012-06-07 HISTORY — DX: Unspecified asthma, uncomplicated: J45.909

## 2012-06-07 HISTORY — DX: Cardiac murmur, unspecified: R01.1

## 2012-06-07 HISTORY — DX: Personal history of other medical treatment: Z92.89

## 2012-06-07 LAB — URINE MICROSCOPIC-ADD ON

## 2012-06-07 LAB — BASIC METABOLIC PANEL
BUN: 38 mg/dL — ABNORMAL HIGH (ref 6–23)
CO2: 25 mEq/L (ref 19–32)
Calcium: 9.8 mg/dL (ref 8.4–10.5)
GFR calc non Af Amer: 29 mL/min — ABNORMAL LOW (ref >90)
Glucose, Bld: 95 mg/dL (ref 70–99)
Potassium: 4 mEq/L (ref 3.5–5.1)
Sodium: 141 mEq/L (ref 135–145)

## 2012-06-07 LAB — CBC WITH DIFFERENTIAL/PLATELET
Eosinophils Absolute: 0 10*3/uL (ref 0.0–0.7)
Eosinophils Relative: 0 % (ref 0–5)
HCT: 34.7 % — ABNORMAL LOW (ref 36.0–46.0)
Hemoglobin: 11.6 g/dL — ABNORMAL LOW (ref 12.0–15.0)
Lymphocytes Relative: 4 % — ABNORMAL LOW (ref 12–46)
Lymphs Abs: 0.7 10*3/uL (ref 0.7–4.0)
MCH: 30.3 pg (ref 26.0–34.0)
MCV: 90.6 fL (ref 78.0–100.0)
Monocytes Relative: 5 % (ref 3–12)
Platelets: 175 10*3/uL (ref 150–400)
RBC: 3.83 MIL/uL — ABNORMAL LOW (ref 3.87–5.11)
WBC: 19 10*3/uL — ABNORMAL HIGH (ref 4.0–10.5)

## 2012-06-07 LAB — URINALYSIS, ROUTINE W REFLEX MICROSCOPIC
Bilirubin Urine: NEGATIVE
Glucose, UA: NEGATIVE mg/dL
Specific Gravity, Urine: 1.019 (ref 1.005–1.030)

## 2012-06-07 LAB — GLUCOSE, CAPILLARY: Glucose-Capillary: 85 mg/dL (ref 70–99)

## 2012-06-07 LAB — CK: Total CK: 79 U/L (ref 7–177)

## 2012-06-07 MED ORDER — NITROGLYCERIN 0.4 MG SL SUBL: 0.4000 mg | SUBLINGUAL_TABLET | SUBLINGUAL | Status: DC | PRN

## 2012-06-07 MED ORDER — AMLODIPINE BESYLATE 5 MG PO TABS
5.0000 mg | ORAL_TABLET | Freq: Every day | ORAL | Status: DC
  Administered 2012-06-07 – 2012-06-09 (×3): 5 mg via ORAL
  Filled 2012-06-07 (×3): qty 1

## 2012-06-07 MED ORDER — TEMAZEPAM 15 MG PO CAPS
30.0000 mg | ORAL_CAPSULE | Freq: Every day | ORAL | Status: DC
  Administered 2012-06-07 – 2012-06-08 (×2): 30 mg via ORAL
  Filled 2012-06-07 (×2): qty 2

## 2012-06-07 MED ORDER — PANTOPRAZOLE SODIUM 40 MG PO TBEC
40.0000 mg | DELAYED_RELEASE_TABLET | Freq: Every day | ORAL | Status: DC
  Administered 2012-06-07 – 2012-06-09 (×3): 40 mg via ORAL
  Filled 2012-06-07 (×3): qty 1

## 2012-06-07 MED ORDER — SODIUM CHLORIDE 0.9 % IJ SOLN
3.0000 mL | Freq: Two times a day (BID) | INTRAMUSCULAR | Status: DC
  Administered 2012-06-08 – 2012-06-09 (×3): 3 mL via INTRAVENOUS

## 2012-06-07 MED ORDER — ENSURE PLUS PO LIQD
237.0000 mL | Freq: Every day | ORAL | Status: DC
  Administered 2012-06-07 – 2012-06-09 (×3): 237 mL via ORAL
  Filled 2012-06-07 (×4): qty 237

## 2012-06-07 MED ORDER — LEVOTHYROXINE SODIUM 75 MCG PO TABS
75.0000 ug | ORAL_TABLET | Freq: Every day | ORAL | Status: DC
  Administered 2012-06-08 – 2012-06-09 (×2): 75 ug via ORAL
  Filled 2012-06-07 (×4): qty 1

## 2012-06-07 MED ORDER — ISOSORBIDE DINITRATE 10 MG PO TABS
10.0000 mg | ORAL_TABLET | Freq: Two times a day (BID) | ORAL | Status: DC
  Administered 2012-06-07 – 2012-06-09 (×4): 10 mg via ORAL
  Filled 2012-06-07 (×5): qty 1

## 2012-06-07 MED ORDER — ACETAMINOPHEN 500 MG PO TABS
500.0000 mg | ORAL_TABLET | Freq: Four times a day (QID) | ORAL | Status: DC | PRN
  Administered 2012-06-08 – 2012-06-09 (×2): 500 mg via ORAL
  Filled 2012-06-07 (×2): qty 1

## 2012-06-07 MED ORDER — ASPIRIN EC 81 MG PO TBEC
81.0000 mg | DELAYED_RELEASE_TABLET | Freq: Every day | ORAL | Status: DC
  Administered 2012-06-07 – 2012-06-09 (×3): 81 mg via ORAL
  Filled 2012-06-07 (×3): qty 1

## 2012-06-07 MED ORDER — TEMAZEPAM 15 MG PO CAPS: 30.0000 mg | ORAL_CAPSULE | Freq: Every day | ORAL | Status: DC

## 2012-06-07 MED ORDER — CALCIUM CARBONATE ANTACID 500 MG PO CHEW
3.0000 | CHEWABLE_TABLET | Freq: Every day | ORAL | Status: DC
  Administered 2012-06-08: 600 mg via ORAL
  Filled 2012-06-07 (×3): qty 3

## 2012-06-07 MED ORDER — FUROSEMIDE 20 MG PO TABS
10.0000 mg | ORAL_TABLET | ORAL | Status: DC
  Filled 2012-06-07: qty 0.5

## 2012-06-07 MED ORDER — SODIUM CHLORIDE 0.9 % IV SOLN
INTRAVENOUS | Status: DC
  Administered 2012-06-07: 19:00:00 via INTRAVENOUS

## 2012-06-07 MED ORDER — POLYETHYLENE GLYCOL 3350 17 G PO PACK
17.0000 g | PACK | Freq: Every day | ORAL | Status: DC | PRN
  Filled 2012-06-07: qty 1

## 2012-06-07 MED ORDER — ALBUTEROL SULFATE (5 MG/ML) 0.5% IN NEBU: 2.5000 mg | INHALATION_SOLUTION | RESPIRATORY_TRACT | Status: DC | PRN

## 2012-06-07 MED ORDER — CARVEDILOL 12.5 MG PO TABS
12.5000 mg | ORAL_TABLET | Freq: Two times a day (BID) | ORAL | Status: DC
  Administered 2012-06-07 – 2012-06-09 (×4): 12.5 mg via ORAL
  Filled 2012-06-07 (×6): qty 1

## 2012-06-07 MED ORDER — POTASSIUM CHLORIDE CRYS ER 10 MEQ PO TBCR
10.0000 meq | EXTENDED_RELEASE_TABLET | Freq: Every day | ORAL | Status: DC
  Administered 2012-06-07 – 2012-06-09 (×3): 10 meq via ORAL
  Filled 2012-06-07 (×3): qty 1

## 2012-06-07 MED ORDER — MELATONIN 3 MG PO TABS
6.0000 mg | ORAL_TABLET | Freq: Every day | ORAL | Status: DC
Start: 2012-06-07 — End: 2012-06-07

## 2012-06-07 NOTE — H&P (Addendum)
[facility] History and Physical  [name] ZOX:[id number] DOB: [birth year] DOA: 06/07/2012  Referring physician:  Dr. Estell Harpin PCP:  [name], MD   Chief Complaint:  Weakness, urinary incontinence, abdominal and back pain, lower extremity leg cramps  HPI:  The patient is a 79 y.o. year-old female with history of CAD, HTN, HLD, chronic systolic ICM with EF 40-45%, mod to severe aortic insufficiency, multifactorial dyspnea, CKD stage 3/4 who presents with weakness, urinary incontinence, and back pain radiating down the legs.  The patient was last at their baseline health Sunday when she developed urinary frequency and some mild generalized weakness.  She was given some food and she felt better, but she continued to have urinary incontinence for the next two days.  Denies urgency and dysuria.  She has had some chills, mild increase in cough with some brown sputum production.  No associated LEE, PND, orthopnea, weight gain.  Denies diarrhea, has chronic soft to loose stools which have not changed.  Yesterday, her left leg stopped working suddenly but has since started working again.  Denies associated left arm weakness or any associated numbness, slurred speech, confusion.  Unclear for how long she was unable to move her left leg, but has completely resolved since.  She has also had some muscle cramps, particularly on the back and in the bilateral quadriceps.    In the ER, she was mildly tachypneic and tachycardic.  CXR and UA were negative for signs of infection, however, UA demonstrated some hemoglobinuria without RBC.  WBC was elevated to 19K with neutrophil predominance.  Mild normocytic anemia at baseline.  She is being admitted for observation for generalized weakness and + SIRS criteria.    Review of Systems:  Denies fevers, but has had chills.  Weight is down slightly from baseline.  Denies changes to hearing and vision.  Denies rhinorrhea, sinus congestion, sore throat.  Denies  chest pain and palpitations.  Denies SOB, wheezing, but does have increased cough.  Denies nausea, vomiting, constipation.  Denies dysuria, frequency, urgency.  Some polyuria, no polydipsia.  Denies hematemesis, blood in stools, melena, abnormal bruising or bleeding.  Denies lymphadenopathy.  Diffuse arthralgias, myalgias, particularly in hands and in quads.  Denies skin rash or ulcer.  Denies lower extremity edema.  Denies focal numbness, weakness, slurred speech, confusion, facial droop.  Denies anxiety and depression.    Past Medical History  Diagnosis Date  . Hyperlipidemia   . Hypertension   . Chronic kidney disease   . Hypothyroidism   . Cardiomegaly   . CHF (congestive heart failure)     Recent hospitalization for CHF & pneumonia -- EF is 40-45%  ( 03/16/10 by Dr. Peter [place] )    . Shortness of breath   . Dyspnea   . Stroke     history of   . History of rheumatic fever   . Head and neck cancer     status post radiation therapy  . Coronary artery disease     EF of 40-45% with history of angioplasty  . Pulmonary hypertension     with PA pressure estimated at 53 mmHg  . Pulmonary edema   . Hypoxia   . Aortic insufficiency     moderate-severe  . Arthritis   . Pneumonia     hx of  . Anemia   . CKD (chronic kidney disease) 01/21/2012  . Complication of anesthesia     wakes up slowly   Past Surgical History  Procedure Laterality  Date  . Tonsillectomy    . Thyroidectomy, partial    . Hammer toe surgery    . Refractive surgery      both eyes  . Coronary angioplasty      with angioplasty  . Joint replacement      bilat  . Knee surgery      bilat   Social History:  reports that she has never smoked. She has never used smokeless tobacco. She reports that she does not drink alcohol or use illicit drugs. Lives at home and children alternate taking care for her.  Normally uses a walker.    Allergies  Allergen Reactions  . Demerol (Meperidine)     hallucinations  .  Doxycycline Hives and Nausea And Vomiting    Headaches   . Enoxaparin Sodium Other (See Comments)    unknown  . Hydrocodone-Acetaminophen Other (See Comments)    Syncope when taken with temazepam  . Loratadine Other (See Comments)    Clariting D-syncope  . Oxycodone-Acetaminophen Itching  . Red Dye Other (See Comments)    Affects veins in 1987    Family History  Problem Relation Age of Onset  . Stroke Father   . Ulcers Father   . Asthma Mother   . Cancer Brother   . Cancer Brother   . Cancer Brother   . Dementia Sister   . Congenital heart disease Sister   . Hypertension Son   . Hypertension Son   . Hypertension Son   . Hypertension Son   . Hypertension Son   . Hypertension Son   . Hypertension Son   . Hypertension Daughter   . Hypertension Daughter   . Hypertension Daughter      Prior to Admission medications   Medication Sig Start Date End Date Taking? Authorizing Provider  acetaminophen (TYLENOL) 500 MG tablet Take 500 mg by mouth as needed. Pain/fever   Yes Historical Provider, MD  amLODipine (NORVASC) 5 MG tablet Take 1 tablet (5 mg total) by mouth daily. 04/02/12  Yes Dione Booze, MD  aspirin EC 81 MG EC tablet Take 1 tablet (81 mg total) by mouth daily. 01/28/12  Yes Rhetta Mura, MD  calcium carbonate (TUMS - DOSED IN MG ELEMENTAL CALCIUM) 500 MG chewable tablet Chew 3 tablets by mouth daily.    Yes Historical Provider, MD  carvedilol (COREG) 12.5 MG tablet Take 12.5 mg by mouth 2 (two) times daily with a meal.   Yes Historical Provider, MD  furosemide (LASIX) 20 MG tablet Take 10 mg by mouth every other day. 09/24/11 09/23/12 Yes Altha Harm, MD  isosorbide dinitrate (ISORDIL) 10 MG tablet Take 10 mg by mouth 2 (two) times daily.    Yes Historical Provider, MD  levothyroxine (SYNTHROID, LEVOTHROID) 75 MCG tablet Take 75 mcg by mouth daily.     Yes Historical Provider, MD  Melatonin 3 MG TABS Take 6 mg by mouth at bedtime.    Yes Historical Provider, MD   nitroGLYCERIN (NITROSTAT) 0.4 MG SL tablet Place 0.4 mg under the tongue every 5 (five) minutes as needed. x3 doses as needed for chest pain 01/28/12  Yes Rhetta Mura, MD  pantoprazole (PROTONIX) 40 MG tablet Take 40 mg by mouth daily.   Yes Historical Provider, MD  potassium chloride (K-DUR,KLOR-CON) 10 MEQ tablet Take 1 tablet (10 mEq total) by mouth daily. 08/24/11 08/23/12 Yes Vesta Mixer, MD  temazepam (RESTORIL) 30 MG capsule Take 30 mg by mouth at bedtime.  Yes Historical Provider, MD  Ensure Plus (ENSURE PLUS) LIQD Take 237 mLs by mouth daily.    Historical Provider, MD   Physical Exam: Filed Vitals:   06/07/12 1245 06/07/12 1300 06/07/12 1315 06/07/12 1330  BP: 166/66 157/58 120/51 121/46  Pulse: 101 102 98 94  Temp:      TempSrc:      Resp: 26 24 21 19   SpO2: 94% 97% 96% 97%     General:  AAF, no acute distress, lying in bed  Eyes:  PERRL, anicteric, non-injected.  ENT:  Nares clear.  OP non-erythematous without plaques or exudates, but scattered petechiae on back of throat.  MMM.  Neck:  Supple without TM or JVD.    Lymph:  No cervical, supraclavicular, or submandibular LAD.    Cardiovascular:  RRR, 3/6 systolic and diastolic murmurs.  2+ pulses, warm extremities  Respiratory:  Rales at bilateral bases with diffuse rhonchi, no wheeze.  No increased WOB.  Abdomen:  NABS.  Soft, ND.  Diffuse mild TTP without rebound or guarding.     Skin:  No rashes or focal lesions.  Musculoskeletal:  Decreased bulk and tone.  Trace LE edema.  Pain with palpation of skin and muscles bilateral lower extremities.  Knees nontender to palpation and ROM.    Psychiatric:  A & O x 4.  Appropriate affect.    Neurologic:  CN 3-12 intact.  4/5 strength throughout.  Sensation intact except fingerstips.     Labs on Admission:  Basic Metabolic Panel:  Recent Labs Lab 06/07/12 1000  NA 141  K 4.0  CL 106  CO2 25  GLUCOSE 95  BUN 38*  CREATININE 1.48*  CALCIUM 9.8    Liver Function Tests: No results found for this basename: AST, ALT, ALKPHOS, BILITOT, PROT, ALBUMIN,  in the last 168 hours No results found for this basename: LIPASE, AMYLASE,  in the last 168 hours No results found for this basename: AMMONIA,  in the last 168 hours CBC:  Recent Labs Lab 06/07/12 1000  WBC 19.0*  NEUTROABS 16.7*  HGB 11.6*  HCT 34.7*  MCV 90.6  PLT 175   Cardiac Enzymes: No results found for this basename: CKTOTAL, CKMB, CKMBINDEX, TROPONINI,  in the last 168 hours  BNP (last 3 results)  Recent Labs  01/22/12 0550 01/25/12 0550 04/02/12 1834  PROBNP 28193.0* 4459.0* 1761.0*   CBG: No results found for this basename: GLUCAP,  in the last 168 hours  Radiological Exams on Admission: Dg Chest Port 1 View  06/07/2012  *RADIOLOGY REPORT*  Clinical Data: Fever and cough.  PORTABLE CHEST - 1 VIEW  Comparison: PA and lateral chest 04/02/2012 and 08/18/2011.  Findings: Heart size is upper normal.  Coarsening of the pulmonary interstitium is again seen.  Lung volumes are low.  No consolidative process, pneumothorax or effusion. Severe degenerative disease about the shoulders is noted.  IMPRESSION: No acute finding.   Original Report Authenticated By: Holley Dexter, M.D.     EKG: Independently reviewed. NSR with inverted T waves and some ST segment depressions in inferior leads and V5-6 which is similar to prior ECG.    Assessment/Plan Principal Problem:   SIRS (systemic inflammatory response syndrome) Active Problems:   HYPOTHYROIDISM   HYPERTENSION   CORONARY ARTERY DISEASE   ABNORMAL HEART RHYTHMS   CONGESTIVE HEART FAILURE   CEREBROVASCULAR ACCIDENT, HX OF   Gait abnormality   CKD (chronic kidney disease)   Weakness   Urinary incontinence   Hemoglobinuria  Transient left leg weakness   Leukocytosis   Principal Problem:   SIRS (systemic inflammatory response syndrome):  Leukocytosis, tachycardia, tachypnea.   -  Monitor for focal signs of  infection.  See below.  -  If fever, or clinical deterioration, start broad spectrum abx for HCAP.    Left lower extremity transient weakness suggestive of TIA. - Telemetry -  MRI/MRA brain -  If positive for acute stroke, neurology consult and further w/u as indicated    Urinary incontinence.  No evidence of severe hyperglycemia or UTI -  PVR to check for urinary retention  Hemoglobinuria, new and may reflect myositis/rhabdo -  CPK added on  Generalized weakness, likely due to underlying infection -  PT/OT consults  Leukocytosis.  UA and CXR negative, but has some bilateral rales on exam suggestive of aspiration pneumonia.  No recent diarrhea.  Check for silent MI.   -  No antibiotics at this time -  Pro-bnp added on -  Repeat CXR in AM -  F/u urine culture -  C. Diff if diarrhea develops -  Telemetry and cycle troponins  Sinus tachycardia, may be due to underlying infection or dehydration -  Gently hydrate -  Hold lasix  CAD, HTN, HLD, stable.  Continue aspirin, BB, statin, imdur, NTG, norvasc. CHF, possible acute exacerbation of systolic CHF despite negative CXR.  Not on ACEI due to stage III/IV CKD.  Hold lasix and continue BB  Insomnia, stable.  Continue restoril and melatonin GERD, stable.  Continue protonix.   Poor appetite:  Liberalize diet and continue ensure supplements Hypothyroid, stable.  Continue synthroid.   CKD, stage III/IV, renally dose medications and avoid nephrotoxins.   Diet:  regular Access:  PIV IVF:  NS at 25ml/h  Proph:  SCDs  Code Status: Full code Family Communication: spoke with patient and her daughter Disposition Plan: admit to telemetry  Time spent: 60 min  Renae Fickle [facility] Pager [phone]  If 7PM-7AM, please contact night-coverage [email or web address] Password [facility] 06/07/2012, 1:44 PM

## 2012-06-07 NOTE — ED Provider Notes (Signed)
History     CSN: [id number]  Arrival date & time 06/07/12  0941   First MD Initiated Contact with Patient 06/07/12 [phone]      Chief Complaint  Patient presents with  . Urinary Incontinence    (Consider location/radiation/quality/duration/timing/severity/associated sxs/prior treatment) Patient is a 79 y.o. female presenting with weakness. The history is provided by the patient (the pt complains of weakness, urinary incontince and chills).  Weakness This is a new problem. The current episode started 2 days ago. The problem occurs constantly. The problem has not changed since onset.Pertinent negatives include no chest pain, no abdominal pain and no headaches. Nothing aggravates the symptoms. Nothing relieves the symptoms.    Past Medical History  Diagnosis Date  . Hyperlipidemia   . Hypertension   . Chronic kidney disease   . Hypothyroidism   . Cardiomegaly   . CHF (congestive heart failure)     Recent hospitalization for CHF & pneumonia -- EF is 40-45%  ( 03/16/10 by Dr. Peter [place] )    . Shortness of breath   . Dyspnea   . Stroke     history of   . History of rheumatic fever   . Head and neck cancer     status post radiation therapy  . Coronary artery disease     EF of 40-45% with history of angioplasty  . Pulmonary hypertension     with PA pressure estimated at 53 mmHg  . Pulmonary edema   . Hypoxia   . Aortic insufficiency     moderate-severe  . Arthritis   . Pneumonia     hx of  . Anemia   . CKD (chronic kidney disease) 01/21/2012  . Complication of anesthesia     wakes up slowly    Past Surgical History  Procedure Laterality Date  . Tonsillectomy    . Thyroidectomy, partial    . Hammer toe surgery    . Refractive surgery      both eyes  . Coronary angioplasty      with angioplasty  . Joint replacement      bilat  . Knee surgery      bilat    Family History  Problem Relation Age of Onset  . Stroke Father   . Ulcers Father   . Asthma Mother   .  Cancer Brother   . Cancer Brother   . Cancer Brother   . Dementia Sister   . Congenital heart disease Sister   . Hypertension Son   . Hypertension Son   . Hypertension Son   . Hypertension Son   . Hypertension Son   . Hypertension Son   . Hypertension Son   . Hypertension Daughter   . Hypertension Daughter   . Hypertension Daughter     History  Substance Use Topics  . Smoking status: Never Smoker   . Smokeless tobacco: Never Used  . Alcohol Use: No    OB History   Grav Para Term Preterm Abortions TAB SAB Ect Mult Living                  Review of Systems  Constitutional: Negative for appetite change and fatigue.  HENT: Negative for congestion, sinus pressure and ear discharge.   Eyes: Negative for discharge.  Respiratory: Negative for cough.   Cardiovascular: Negative for chest pain.  Gastrointestinal: Negative for abdominal pain and diarrhea.  Genitourinary: Negative for frequency and hematuria.  Musculoskeletal: Negative for back pain.  Skin: Negative  for rash.  Neurological: Positive for weakness. Negative for seizures and headaches.  Psychiatric/Behavioral: Negative for hallucinations.    Allergies  Demerol; Doxycycline; Enoxaparin sodium; Hydrocodone-acetaminophen; Loratadine; Oxycodone-acetaminophen; and Red dye  Home Medications   Current Outpatient Rx  Name  Route  Sig  Dispense  Refill  . acetaminophen (TYLENOL) 500 MG tablet   Oral   Take 500 mg by mouth as needed. Pain/fever         . amLODipine (NORVASC) 5 MG tablet   Oral   Take 1 tablet (5 mg total) by mouth daily.   10 tablet   0   . aspirin EC 81 MG EC tablet   Oral   Take 1 tablet (81 mg total) by mouth daily.   30 tablet      . calcium carbonate (TUMS - DOSED IN MG ELEMENTAL CALCIUM) 500 MG chewable tablet   Oral   Chew 3 tablets by mouth daily.          . carvedilol (COREG) 12.5 MG tablet   Oral   Take 12.5 mg by mouth 2 (two) times daily with a meal.         .  furosemide (LASIX) 20 MG tablet   Oral   Take 10 mg by mouth every other day.         . isosorbide dinitrate (ISORDIL) 10 MG tablet   Oral   Take 10 mg by mouth 2 (two) times daily.          Marland Kitchen levothyroxine (SYNTHROID, LEVOTHROID) 75 MCG tablet   Oral   Take 75 mcg by mouth daily.           . Melatonin 3 MG TABS   Oral   Take 6 mg by mouth at bedtime.          . nitroGLYCERIN (NITROSTAT) 0.4 MG SL tablet   Sublingual   Place 0.4 mg under the tongue every 5 (five) minutes as needed. x3 doses as needed for chest pain         . pantoprazole (PROTONIX) 40 MG tablet   Oral   Take 40 mg by mouth daily.         . potassium chloride (K-DUR,KLOR-CON) 10 MEQ tablet   Oral   Take 1 tablet (10 mEq total) by mouth daily.   30 tablet   5   . temazepam (RESTORIL) 30 MG capsule   Oral   Take 30 mg by mouth at bedtime.           . Ensure Plus (ENSURE PLUS) LIQD   Oral   Take 237 mLs by mouth daily.           BP 139/57  Pulse 93  Temp(Src) 98.6 F (37 C) (Oral)  Resp 23  SpO2 96%  Physical Exam  Constitutional: She is oriented to person, place, and time. She appears well-developed.  HENT:  Head: Normocephalic.  Eyes: Conjunctivae and EOM are normal. No scleral icterus.  Neck: Neck supple. No thyromegaly present.  Cardiovascular: Normal rate and regular rhythm.  Exam reveals no gallop and no friction rub.   No murmur heard. Pulmonary/Chest: No stridor. She has no wheezes. She has no rales. She exhibits no tenderness.  Abdominal: She exhibits no distension. There is no tenderness. There is no rebound.  Musculoskeletal: Normal range of motion. She exhibits no edema.  Lymphadenopathy:    She has no cervical adenopathy.  Neurological: She is oriented to person, place, and time. Coordination  normal.  Skin: No rash noted. No erythema.  Psychiatric: She has a normal mood and affect. Her behavior is normal.    ED Course  Procedures (including critical care  time)  Labs Reviewed  CBC WITH DIFFERENTIAL - Abnormal; Notable for the following:    WBC 19.0 (*)    RBC 3.83 (*)    Hemoglobin 11.6 (*)    HCT 34.7 (*)    Neutrophils Relative 91 (*)    Neutro Abs 16.7 (*)    Lymphocytes Relative 4 (*)    All other components within normal limits  BASIC METABOLIC PANEL - Abnormal; Notable for the following:    BUN 38 (*)    Creatinine, Ser 1.48 (*)    GFR calc non Af Amer 29 (*)    GFR calc Af Amer 33 (*)    All other components within normal limits  URINALYSIS, ROUTINE W REFLEX MICROSCOPIC - Abnormal; Notable for the following:    Hgb urine dipstick MODERATE (*)    Protein, ur 30 (*)    All other components within normal limits  URINE MICROSCOPIC-ADD ON - Abnormal; Notable for the following:    Casts GRANULAR CAST (*)    All other components within normal limits   Dg Chest Port 1 View  06/07/2012  *RADIOLOGY REPORT*  Clinical Data: Fever and cough.  PORTABLE CHEST - 1 VIEW  Comparison: PA and lateral chest 04/02/2012 and 08/18/2011.  Findings: Heart size is upper normal.  Coarsening of the pulmonary interstitium is again seen.  Lung volumes are low.  No consolidative process, pneumothorax or effusion. Severe degenerative disease about the shoulders is noted.  IMPRESSION: No acute finding.   Original Report Authenticated By: Holley Dexter, M.D.      1. Weakness       MDM          Benny Lennert, MD 06/07/12 1236

## 2012-06-07 NOTE — ED Notes (Signed)
Pt. Eating ;lunch, daughter is assisting

## 2012-06-07 NOTE — ED Notes (Signed)
Pt. Began unable to control her urine, having periods of incontinence.  Denies any dysuria, or odor.    Having lower back pain, intermittent, into her legs.  Denies any injuries.  Pt. Having weakness.  Denies any n/v/d.  No edema.  Denies any chest pain. No sob.  Movement increases the pain.  Denies any cough or congestion.  CBG 112

## 2012-06-08 ENCOUNTER — Observation Stay (HOSPITAL_COMMUNITY): Payer: Medicare Other

## 2012-06-08 DIAGNOSIS — I509 Heart failure, unspecified: Secondary | ICD-10-CM

## 2012-06-08 DIAGNOSIS — Q228 Other congenital malformations of tricuspid valve: Secondary | ICD-10-CM

## 2012-06-08 DIAGNOSIS — I272 Pulmonary hypertension, unspecified: Secondary | ICD-10-CM | POA: Diagnosis present

## 2012-06-08 DIAGNOSIS — I2789 Other specified pulmonary heart diseases: Secondary | ICD-10-CM

## 2012-06-08 DIAGNOSIS — I5033 Acute on chronic diastolic (congestive) heart failure: Secondary | ICD-10-CM

## 2012-06-08 DIAGNOSIS — J309 Allergic rhinitis, unspecified: Secondary | ICD-10-CM

## 2012-06-08 LAB — EXPECTORATED SPUTUM ASSESSMENT W GRAM STAIN, RFLX TO RESP C

## 2012-06-08 LAB — FERRITIN: Ferritin: 211 ng/mL (ref 10–291)

## 2012-06-08 LAB — HEPATIC FUNCTION PANEL
ALT: 8 U/L (ref 0–35)
Bilirubin, Direct: <0.1 mg/dL (ref 0.0–0.3)

## 2012-06-08 LAB — BASIC METABOLIC PANEL
BUN: 38 mg/dL — ABNORMAL HIGH (ref 6–23)
Calcium: 8.9 mg/dL (ref 8.4–10.5)
Creatinine, Ser: 1.36 mg/dL — ABNORMAL HIGH (ref 0.50–1.10)
GFR calc non Af Amer: 32 mL/min — ABNORMAL LOW (ref >90)
Glucose, Bld: 103 mg/dL — ABNORMAL HIGH (ref 70–99)

## 2012-06-08 LAB — IRON AND TIBC
Saturation Ratios: 5 % — ABNORMAL LOW (ref 20–55)
TIBC: 234 ug/dL — ABNORMAL LOW (ref 250–470)

## 2012-06-08 LAB — RETICULOCYTES: Retic Ct Pct: 1.1 % (ref 0.4–3.1)

## 2012-06-08 LAB — FOLATE: Folate: >20 ng/mL

## 2012-06-08 LAB — VITAMIN B12: Vitamin B-12: 717 pg/mL (ref 211–911)

## 2012-06-08 LAB — URINE CULTURE

## 2012-06-08 LAB — MRSA PCR SCREENING: MRSA by PCR: NEGATIVE

## 2012-06-08 LAB — TYPE AND SCREEN
ABO/RH(D): A POS
Antibody Screen: NEGATIVE

## 2012-06-08 LAB — CBC
MCH: 29.5 pg (ref 26.0–34.0)
MCHC: 32.4 g/dL (ref 30.0–36.0)
Platelets: 150 10*3/uL (ref 150–400)
RDW: 14.3 % (ref 11.5–15.5)

## 2012-06-08 LAB — TSH: TSH: 3.155 u[IU]/mL (ref 0.350–4.500)

## 2012-06-08 MED ORDER — POLYVINYL ALCOHOL 1.4 % OP SOLN
1.0000 [drp] | OPHTHALMIC | Status: DC | PRN
  Filled 2012-06-08: qty 15

## 2012-06-08 MED ORDER — FUROSEMIDE 20 MG PO TABS
10.0000 mg | ORAL_TABLET | Freq: Every day | ORAL | Status: DC
  Administered 2012-06-09: 10 mg via ORAL
  Filled 2012-06-08: qty 0.5

## 2012-06-08 MED ORDER — LEVOFLOXACIN IN D5W 500 MG/100ML IV SOLN
500.0000 mg | INTRAVENOUS | Status: DC
  Filled 2012-06-08: qty 100

## 2012-06-08 MED ORDER — LEVOFLOXACIN IN D5W 500 MG/100ML IV SOLN
500.0000 mg | INTRAVENOUS | Status: DC
  Administered 2012-06-08: 500 mg via INTRAVENOUS
  Filled 2012-06-08: qty 100

## 2012-06-08 NOTE — Progress Notes (Signed)
[facility] PROGRESS NOTE  [name] UJW:[id number] DOB: Dec 30, 1915 DOA: 06/07/2012 PCP: [name], MD  Assessment/Plan:  1. SIRS (systemic inflammatory response syndrome): Leukocytosis, tachycardia, tachypnea.  - unclear etiology -  Urine culture pending  - clearly has acute bronchitis at least if not bronchopneumonia -  - started levaquin iv 500 mg daily 06/08/12  2. Left lower extremity transient weakness suggestive of TIA.  - MRI/MRA brain no acute stroke.  - PT eval   3. Urinary incontinence. No evidence of severe  UTI  - PVR to check for urinary retention  Hemoglobinuria of unclear etiology - CPK WNL   4. Generalized weakness, likely due to underlying infection and anemia  - PT/OT consults   5. Leukocytosis. but has some bilateral rales on exam suggestive of bronchitis vs bronchopneumonia. No recent diarrhea.  silent MI ruled out with normal troponins  - F/u urine culture  - C. Diff if diarrhea develops  - improved by 4/24    6. Sinus tachycardia, may be due to underlying infection or dehydration or bleeding  -  Hydrated from 4/23 until 4/24  - Held lasix initially - resumed 4/25   CAD, HTN, HLD, stable. Continue aspirin, BB, statin, imdur, NTG, norvasc.   CHF, possible acute exacerbation of diastolic CHF   Anemia - with hemoglobin drop from baseline of 12.4 on 03/2012 to a level of 9.5 on 4/24. Patient with history of unexplained anemia in the past requiring transfusions - workup sent out 4/24 records from [facility] reviewed 4/24    Code Status: full Family Communication: daughter  Disposition Plan: home    Consultants:    Procedures:    Antibiotics: Levaquin 4/24  HPI/Subjective: Feels cold and weak   Objective: Filed Vitals:   06/07/12 1330 06/07/12 1500 06/07/12 2100 06/08/12 0606  BP: 121/46 161/86 104/46 135/55  Pulse: 94 111 94 96  Temp:  98 F (36.7 C) 98.7 F (37.1 C) 98.2 F (36.8 C)  TempSrc:  Oral Oral Oral  Resp: 19  22 20 20   Height:  5' (1.524 m)    Weight:  57.3 kg (126 lb 5.2 oz)  58.3 kg (128 lb 8.5 oz)  SpO2: 97% 94% 93% 96%   Patient Vitals for the past 24 hrs:  BP Temp Temp src Pulse Resp SpO2 Height Weight  06/08/12 0606 135/55 mmHg 98.2 F (36.8 C) Oral 96 20 96 % - 58.3 kg (128 lb 8.5 oz)  06/07/12 2100 104/46 mmHg 98.7 F (37.1 C) Oral 94 20 93 % - -  06/07/12 1500 161/86 mmHg 98 F (36.7 C) Oral 111 22 94 % 5' (1.524 m) 57.3 kg (126 lb 5.2 oz)  06/07/12 1330 121/46 mmHg - - 94 19 97 % - -  06/07/12 1315 120/51 mmHg - - 98 21 96 % - -  06/07/12 1300 157/58 mmHg - - 102 24 97 % - -  06/07/12 1245 166/66 mmHg - - 101 26 94 % - -  06/07/12 1230 171/72 mmHg - - 101 26 94 % - -  06/07/12 1215 163/74 mmHg - - 99 24 98 % - -  06/07/12 1200 139/57 mmHg - - 93 23 96 % - -  06/07/12 1145 128/56 mmHg - - 94 24 91 % - -  06/07/12 1130 117/54 mmHg - - 93 23 95 % - -  06/07/12 1115 132/61 mmHg - - 94 23 94 % - -  06/07/12 1100 147/59 mmHg - - 95 22 92 % - -  06/07/12 1045 173/71 mmHg - - 100 24 95 % - -  06/07/12 1030 169/76 mmHg - - 104 29 94 % - -  06/07/12 1015 145/58 mmHg - - 101 21 94 % - -  06/07/12 1000 169/68 mmHg - - 103 24 94 % - -  06/07/12 0947 152/62 mmHg 98.6 F (37 C) Oral 105 16 96 % - -     Intake/Output Summary (Last 24 hours) at 06/08/12 0901 Last data filed at 06/07/12 1850  Gross per 24 hour  Intake      0 ml  Output      1 ml  Net     -1 ml   Filed Weights   06/07/12 1500 06/08/12 0606  Weight: 57.3 kg (126 lb 5.2 oz) 58.3 kg (128 lb 8.5 oz)    Exam:   General:  axox3  Cardiovascular: rrr, 5/6 systolic murmur   Respiratory: bilat rhonchi   Abdomen: soft, nt   Musculoskeletal: decreased muscle bulk    Data Reviewed: Basic Metabolic Panel:  Recent Labs Lab 06/07/12 1000 06/08/12 0330  NA 141 140  K 4.0 4.4  CL 106 108  CO2 25 24  GLUCOSE 95 103*  BUN 38* 38*  CREATININE 1.48* 1.36*  CALCIUM 9.8 8.9   Liver Function Tests: No results  found for this basename: AST, ALT, ALKPHOS, BILITOT, PROT, ALBUMIN,  in the last 168 hours No results found for this basename: LIPASE, AMYLASE,  in the last 168 hours No results found for this basename: AMMONIA,  in the last 168 hours CBC:  Recent Labs Lab 06/07/12 1000 06/08/12 0330  WBC 19.0* 11.0*  NEUTROABS 16.7*  --   HGB 11.6* 9.5*  HCT 34.7* 29.3*  MCV 90.6 91.0  PLT 175 150   Cardiac Enzymes:  Recent Labs Lab 06/07/12 1000 06/07/12 1550 06/07/12 2128 06/08/12 0330  CKTOTAL 79  --   --   --   TROPONINI  --  <0.30 <0.30 <0.30   BNP (last 3 results)  Recent Labs  04/02/12 1834 06/07/12 1000 06/07/12 1550  PROBNP 1761.0* 2076.0* 3203.0*   CBG:  Recent Labs Lab 06/07/12 1646  GLUCAP 85    Recent Results (from the past 240 hour(s))  MRSA PCR SCREENING     Status: None   Collection Time    06/07/12  9:44 PM      Result Value Range Status   MRSA by PCR NEGATIVE  NEGATIVE Final   Comment:            The GeneXpert MRSA Assay (FDA     approved for NASAL specimens     only), is one component of a     comprehensive MRSA colonization     surveillance program. It is not     intended to diagnose MRSA     infection nor to guide or     monitor treatment for     MRSA infections.     Studies: Dg Chest 2 View  06/08/2012  *RADIOLOGY REPORT*  Clinical Data: Pneumonia, cough  CHEST - 2 VIEW  Comparison: 06/07/2012  Findings: Enlargement of cardiac silhouette with pulmonary vascular congestion. Calcified tortuous aorta. Mild perihilar infiltrates likely edema. Persistent elevation of left diaphragm. No gross pleural effusion or pneumothorax. Bilateral chronic rotator cuff tears.  IMPRESSION: Enlargement of cardiac silhouette with pulmonary vascular congestion and probable perihilar edema.   Original Report Authenticated By: Ulyses Southward, M.D.    Mr Brain Wo Contrast  06/08/2012  *  RADIOLOGY REPORT*  Clinical Data: 79 year old female with weakness, incontinence, pain.   Left lower extremity weakness.  MRI HEAD WITHOUT CONTRAST  Technique:  Multiplanar, multiecho pulse sequences of the brain and surrounding structures were obtained according to standard protocol without intravenous contrast.  Comparison: Head CTs 10/01/2010 and earlier.  Findings: Mild diffusion artifact at the right middle cerebellar peduncle related to a chronic micro hemorrhage (see series 7 image 7).  No convincing area of restricted diffusion to suggest acute infarction.  Stable cerebral volume.  Partially empty sella configuration.  No ventriculomegaly.  Occasional chronic micro hemorrhages in the brain including that at the right middle cerebellar peduncle. No acute intracranial hemorrhage identified.  No midline shift, mass effect, or evidence of mass lesion.  Negative cervicomedullary junction.  Negative for age visualized cervical spine. Major intracranial vascular flow voids are preserved.  Patchy and confluent cerebral white matter T2 and FLAIR hyperintensity. Moderate T2 heterogeneity in the deep gray matter nuclei.  To some extent this represents dilated perivascular spaces.  Similar moderate patchy T2 hyperintensity in the pons.  Normal bone marrow signal.  No acute orbit soft tissue findings. Small left mastoid effusion.  Negative visualized nasopharynx. Negative scalp soft tissues.  IMPRESSION: 1. No acute intracranial abnormality. 2.  Chronic small vessel disease.   Original Report Authenticated By: Erskine Speed, M.D.    Dg Chest Port 1 View  06/07/2012  *RADIOLOGY REPORT*  Clinical Data: Fever and cough.  PORTABLE CHEST - 1 VIEW  Comparison: PA and lateral chest 04/02/2012 and 08/18/2011.  Findings: Heart size is upper normal.  Coarsening of the pulmonary interstitium is again seen.  Lung volumes are low.  No consolidative process, pneumothorax or effusion. Severe degenerative disease about the shoulders is noted.  IMPRESSION: No acute finding.   Original Report Authenticated By: Holley Dexter, M.D.     Scheduled Meds: . amLODipine  5 mg Oral Daily  . aspirin EC  81 mg Oral Daily  . calcium carbonate  3 tablet Oral Daily  . carvedilol  12.5 mg Oral BID WC  . Ensure Plus  237 mL Oral Daily  . isosorbide dinitrate  10 mg Oral BID  . levofloxacin (LEVAQUIN) IV  500 mg Intravenous Q48H  . levothyroxine  75 mcg Oral QAC breakfast  . pantoprazole  40 mg Oral Daily  . potassium chloride  10 mEq Oral Daily  . sodium chloride  3 mL Intravenous Q12H  . temazepam  30 mg Oral QHS   Continuous Infusions:    Principal Problem:   SIRS (systemic inflammatory response syndrome) Active Problems:   CARCINOMA   HYPOTHYROIDISM   HYPERTENSION   CONGESTIVE HEART FAILURE   GERD   DIVERTICULOSIS-COLON   DYSPHAGIA   CEREBROVASCULAR ACCIDENT, HX OF   Bronchitis   Gait abnormality   CKD (chronic kidney disease)   Acute on chronic diastolic CHF (congestive heart failure)   Weakness   Urinary incontinence   Hemoglobinuria   Transient left leg weakness   Leukocytosis   Tricuspid regurgitation, congenital   Pulmonary hypertension       Brailey Buescher  [facility] Pager [phone]. If 7PM-7AM, please contact night-coverage at [email or web address], password [facility] 06/08/2012, 9:01 AM  LOS: 1 day

## 2012-06-08 NOTE — Evaluation (Signed)
Physical Therapy Evaluation Patient Details Name: [name] MRN: [id number] DOB: 1915-07-20 Today's Date: 06/08/2012 Time: 1478-2956 PT Time Calculation (min): 44 min  PT Assessment / Plan / Recommendation Clinical Impression  [name] is a 79 y/o female admitted with SIRS.  Pt lives alone and has family checking on her throughout the day. According to pt's daughters who were present during the evaluation, [name] appears to be perfoming functional mobility near her baseline level. C/o mild R foot pain when ambulating but able to walk over 100 feet. Acute PT will continue to follow pt to insure consistency of safe mobility and further assess R foot. No follow-up PT suggestions at this time.     PT Assessment  Patient needs continued PT services    Follow Up Recommendations  No PT follow up;Supervision - Intermittent    Does the patient have the potential to tolerate intense rehabilitation      Barriers to Discharge None      Equipment Recommendations  None recommended by PT    Recommendations for Other Services     Frequency Min 3X/week    Precautions / Restrictions     Pertinent Vitals/Pain C/o pain in R foot.  Notified RN.       Mobility  Bed Mobility Bed Mobility: Supine to Sit;Sit to Supine Supine to Sit: 6: Modified independent (Device/Increase time);HOB elevated Sitting - Scoot to Edge of Bed: 6: Modified independent (Device/Increase time) Sit to Supine: 6: Modified independent (Device/Increase time);HOB elevated Transfers Transfers: Sit to Stand;Stand to Sit Sit to Stand: 5: Supervision Stand to Sit: 5: Supervision Details for Transfer Assistance: VC's for safe hand placement stand to sit Ambulation/Gait Ambulation/Gait Assistance: 5: Supervision Ambulation Distance (Feet): 120 Feet Assistive device: Rolling walker Ambulation/Gait Assistance Details: VCs to increase R step length and decrease L step length for less Dorsiflexion in R ankle during L  swing phase.  Pt reports less pain with the technique.  Gait Pattern: Step-to pattern;Trunk flexed Gait velocity: slow General Gait Details: Pt presents with kyphotic posture unable to extend trunk or neck to look forward without significant strain.   Stairs: No Wheelchair Mobility Wheelchair Mobility: No    Exercises     PT Diagnosis: Acute pain  PT Problem List: Decreased mobility;Pain PT Treatment Interventions: Gait training;DME instruction;Functional mobility training;Therapeutic activities;Patient/family education   PT Goals Acute Rehab PT Goals PT Goal Formulation: With patient Time For Goal Achievement: 06/15/12 Potential to Achieve Goals: Good Pt will go Supine/Side to Sit: Independently PT Goal: Supine/Side to Sit - Progress: Goal set today Pt will go Sit to Supine/Side: Independently PT Goal: Sit to Supine/Side - Progress: Goal set today Pt will Transfer Bed to Chair/Chair to Bed: with modified independence PT Transfer Goal: Bed to Chair/Chair to Bed - Progress: Goal set today Pt will Ambulate: >150 feet;with modified independence;with least restrictive assistive device PT Goal: Ambulate - Progress: Goal set today  Visit Information  Last PT Received On: 06/08/12 Assistance Needed: +1    Subjective Data  Subjective: My foot hurts Patient Stated Goal: return to home   Prior Functioning  Home Living Available Help at Discharge: Family;Available PRN/intermittently Type of Home: House Home Access: Ramped entrance Home Layout: One level Bathroom Shower/Tub: Health visitor: Standard Bathroom Accessibility: Yes How Accessible: Accessible via walker Home Adaptive Equipment: Bedside commode/3-in-1;Shower chair with back;Walker - rolling Prior Function Level of Independence: Needs assistance Needs Assistance: Light Housekeeping Light Housekeeping: Total Able to Take Stairs?: Yes Driving: No Vocation:  Retired Musician: No  difficulties    Copywriter, advertising Arousal/Alertness: Awake/alert Behavior During Therapy: WFL for tasks assessed/performed Overall Cognitive Status: Within Functional Limits for tasks assessed    Extremity/Trunk Assessment Right Lower Extremity Assessment RLE ROM/Strength/Tone: WFL for tasks assessed RLE ROM/Strength/Tone Deficits: Pt c/o pain at the dorsal talus with ankle DF and deep palpation.       Left Lower Extremity Assessment LLE ROM/Strength/Tone: WFL for tasks assessed   Balance Balance Balance Assessed: Yes Static Sitting Balance Static Sitting - Balance Support: Feet supported Static Sitting - Level of Assistance: 7: Independent Static Sitting - Comment/# of Minutes: 2+ minutes sitting on EOB with no LOB.  Dynamic Sitting Balance Dynamic Sitting - Balance Support: Feet supported Static Standing Balance Static Standing - Balance Support: Bilateral upper extremity supported Static Standing - Level of Assistance: 5: Stand by assistance Static Standing - Comment/# of Minutes: 1 minute with RW and No LOB  End of Session PT - End of Session Equipment Utilized During Treatment: Gait belt Activity Tolerance: Patient tolerated treatment well Patient left: in bed;with call bell/phone within reach;with family/visitor present Nurse Communication: Mobility status  GP Functional Assessment Tool Used: clinical judgement Functional Limitation: Mobility: Walking and moving around Mobility: Walking and Moving Around Current Status [phone]): 0 percent impaired, limited or restricted Mobility: Walking and Moving Around Goal Status ([id number]): 0 percent impaired, limited or restricted   [name] 06/08/2012, 5:04 PM

## 2012-06-08 NOTE — Evaluation (Signed)
Occupational Therapy Evaluation and Discharge Patient Details Name: NATOYA VISCOMI MRN: [id number] DOB: Jul 09, 1915 Today's Date: 06/08/2012 Time: 4098-1191 OT Time Calculation (min): 26 min  OT Assessment / Plan / Recommendation Clinical Impression  This 79 yo female admitted with weakness, urinary incontinence, abdominal and back pain, lower extremity leg cramps, SIRS presents to acute OT at S level which she has at home. No further OT needs, will sign off.    OT Assessment  Patient does not need any further OT services    Follow Up Recommendations  No OT follow up       Equipment Recommendations  None recommended by OT          Precautions / Restrictions Precautions Precautions: None Restrictions Weight Bearing Restrictions: No   Pertinent Vitals/Pain Pain: top of right ankle with movement, heat applied    ADL  Eating/Feeding: Independent;Simulated Where Assessed - Eating/Feeding: Chair Grooming: Simulated;Supervision/safety;Set up Where Assessed - Grooming: Unsupported sitting Upper Body Bathing: Set up;Supervision/safety;Simulated Where Assessed - Upper Body Bathing: Unsupported sitting Lower Body Bathing: Simulated;Set up;Supervision/safety Where Assessed - Lower Body Bathing: Unsupported sit to stand Upper Body Dressing: Simulated;Supervision/safety;Set up Where Assessed - Upper Body Dressing: Unsupported sitting Lower Body Dressing: Simulated;Supervision/safety;Set up Where Assessed - Lower Body Dressing: Unsupported sit to stand Toilet Transfer: Simulated;Supervision/safety Toilet Transfer Method: Sit to Barista:  (bed around to recliner) Toileting - Clothing Manipulation and Hygiene: Simulated;Supervision/safety Where Assessed - Toileting Clothing Manipulation and Hygiene: Standing Equipment Used: Rolling walker Transfers/Ambulation Related to ADLs: Pt took increased time to ambulate from bed>recliner.  Pt needed 1 VC for hand placement  with sitting at recliner ADL Comments: Pt has been performing all of her ADLS at home        Visit Information  Last OT Received On: 06/08/12 Assistance Needed: +1    Subjective Data  Subjective: I just love you all(after A her to the chair)   Prior Functioning     Home Living Available Help at Discharge: Family;Available PRN/intermittently Type of Home: House Home Access: Ramped entrance Home Layout: One level Bathroom Shower/Tub: Health visitor: Standard Bathroom Accessibility: Yes How Accessible: Accessible via walker Home Adaptive Equipment: Bedside commode/3-in-1;Shower chair with back;Walker - rolling Prior Function Level of Independence: Needs assistance Needs Assistance: Light Housekeeping Light Housekeeping: Total Able to Take Stairs?: Yes Driving: No Vocation: Retired Musician: No difficulties         Vision/Perception Vision - History Baseline Vision: Wears glasses all the time Patient Visual Report: No change from baseline   Cognition  Cognition Arousal/Alertness: Awake/alert Behavior During Therapy: WFL for tasks assessed/performed Overall Cognitive Status: Within Functional Limits for tasks assessed    Extremity/Trunk Assessment Right Upper Extremity Assessment RUE ROM/Strength/Tone: [facility] for tasks assessed Left Upper Extremity Assessment LUE ROM/Strength/Tone: WFL for tasks assessed     Mobility Bed Mobility Bed Mobility: Supine to Sit;Sitting - Scoot to Edge of Bed Supine to Sit: HOB flat;With rails;6: Modified independent (Device/Increase time) Sitting - Scoot to Edge of Bed: 6: Modified independent (Device/Increase time) Transfers Transfers: Sit to Stand;Stand to Sit Sit to Stand: 5: Supervision;With upper extremity assist;From bed Stand to Sit: 5: Supervision;With upper extremity assist;With armrests;To chair/3-in-1 Details for Transfer Assistance: VC's for safe hand placement stand to sit            End of Session OT - End of Session Activity Tolerance: Patient tolerated treatment well Patient left: in chair;with family/visitor present Nurse Communication:  (pain in right top  of ankle, heat applied)  GO Functional Assessment Tool Used: Clinical observation Functional Limitation: Self care Self Care Current Status (Z6109): At least 1 percent but less than 20 percent impaired, limited or restricted Self Care Goal Status (U0454): At least 1 percent but less than 20 percent impaired, limited or restricted Self Care Discharge Status [phone]): At least 1 percent but less than 20 percent impaired, limited or restricted   Evette Georges [phone] 06/08/2012, 12:10 PM

## 2012-06-09 LAB — CBC
HCT: 31.2 % — ABNORMAL LOW (ref 36.0–46.0)
Hemoglobin: 10.3 g/dL — ABNORMAL LOW (ref 12.0–15.0)
WBC: 9.1 10*3/uL (ref 4.0–10.5)

## 2012-06-09 LAB — BASIC METABOLIC PANEL
BUN: 37 mg/dL — ABNORMAL HIGH (ref 6–23)
Chloride: 107 mEq/L (ref 96–112)
Glucose, Bld: 103 mg/dL — ABNORMAL HIGH (ref 70–99)
Potassium: 4.4 mEq/L (ref 3.5–5.1)

## 2012-06-09 MED ORDER — AMOXICILLIN-POT CLAVULANATE 500-125 MG PO TABS: 1.0000 | ORAL_TABLET | Freq: Two times a day (BID) | ORAL | Status: DC

## 2012-06-09 MED ORDER — LEVOFLOXACIN IN D5W 250 MG/50ML IV SOLN
250.0000 mg | Freq: Once | INTRAVENOUS | Status: AC
  Administered 2012-06-09: 250 mg via INTRAVENOUS
  Filled 2012-06-09: qty 50

## 2012-06-09 MED ORDER — FERROUS GLUCONATE 225 (27 FE) MG PO TABS: 240.0000 mg | ORAL_TABLET | Freq: Three times a day (TID) | ORAL | Status: DC

## 2012-06-09 MED ORDER — FUROSEMIDE 20 MG PO TABS: 10.0000 mg | ORAL_TABLET | Freq: Every day | ORAL | Status: DC

## 2012-06-09 NOTE — Progress Notes (Signed)
Notified orthopedics about pt's wrap for her foot.  Orthopedics came and placed wrap on pt before pt was DC.  DC instructions given to pt and family, family verbalized understanding.  Pt DC home via wc.

## 2012-06-09 NOTE — Progress Notes (Signed)
Physical Therapy Treatment Patient Details Name: [name] MRN: [id number] DOB: 02-Jan-1916 Today's Date: 06/09/2012 Time: 9629-5284 PT Time Calculation (min): 23 min  PT Assessment / Plan / Recommendation Comments on Treatment Session  Pt c/o R foot pain. Md reported she is medically ready for d/c home today if she is able to tolerate ambulation and activity. Pt verbalizes medial ankle pain with ROM. Pt does have full PROM and able to actively move in all ranges, minimal edema noted.  Recommend elevation, ice, and ace wrap for stability. Pt reports she does want to d/c home with family providing assistance. Recommend HHPT follow-up; however, pt is declining.    Follow Up Recommendations  No PT follow up;Other (comment) (pt declined)     Does the patient have the potential to tolerate intense rehabilitation     Barriers to Discharge        Equipment Recommendations       Recommendations for Other Services    Frequency     Plan Discharge plan remains appropriate    Precautions / Restrictions Precautions Precautions: Fall   Pertinent Vitals/Pain     Mobility  Bed Mobility Bed Mobility: Supine to Sit Supine to Sit: 6: Modified independent (Device/Increase time);HOB elevated;With rails Sitting - Scoot to Edge of Bed: 6: Modified independent (Device/Increase time) Sit to Supine: 6: Modified independent (Device/Increase time);HOB elevated Details for Bed Mobility Assistance: increased time Transfers Transfers: Sit to Stand;Stand to Sit Sit to Stand: 4: Min assist;From bed Stand to Sit: 4: Min assist;To bed Details for Transfer Assistance: min assist with standing due to right foot pain. Ambulation/Gait Ambulation/Gait Assistance: 4: Min assist Ambulation Distance (Feet): 5 Feet Assistive device: Rolling walker Ambulation/Gait Assistance Details: cues to bear weight on UE to decrease pain in Right ankle. Gait Pattern: Step-to pattern;Trunk flexed Gait velocity:  decreased Stairs: No    Exercises     PT Diagnosis:    PT Problem List:   PT Treatment Interventions:     PT Goals Acute Rehab PT Goals PT Goal: Supine/Side to Sit - Progress: Progressing toward goal PT Goal: Sit to Supine/Side - Progress: Progressing toward goal PT Transfer Goal: Bed to Chair/Chair to Bed - Progress: Progressing toward goal PT Goal: Ambulate - Progress: Progressing toward goal  Visit Information  Last PT Received On: 06/09/12 Assistance Needed: +1    Subjective Data  Subjective: My foot hurts, but I still want to go home.   Cognition  Cognition Arousal/Alertness: Awake/alert Behavior During Therapy: WFL for tasks assessed/performed Overall Cognitive Status: Within Functional Limits for tasks assessed    Balance  Balance Balance Assessed: No  End of Session PT - End of Session Equipment Utilized During Treatment: Gait belt Activity Tolerance: Patient limited by pain Patient left: in bed;with family/visitor present Nurse Communication: Mobility status   GP Functional Assessment Tool Used: cliniacl judgement Functional Limitation: Mobility: Walking and moving around Mobility: Walking and Moving Around Current Status (X3244): At least 1 percent but less than 20 percent impaired, limited or restricted   [name] 06/09/2012, 12:20 PM

## 2012-06-09 NOTE — Discharge Summary (Signed)
Physician Discharge Summary  [name] ZOX:[id number] DOB: 01/06/1916 DOA: 06/07/2012  PCP: [name], MD  Admit date: 06/07/2012 Discharge date: 06/09/2012   Recommendations for Outpatient Follow-up:  1. Ankle sprain - if continues to be symptomatic by April 28 patient may benefit from referral to sports medicine 2. consider outpatient referral to hematology to fully workup the anemia   Discharge Diagnoses:   SIRS (systemic inflammatory response syndrome) - of unclear etiology-resolved   Leukocytosis of unclear etiology-resolved Nasopharyngeal  CARCINOMA - in remission   HYPOTHYROIDISM   HYPERTENSION   CONGESTIVE HEART FAILURE   GERD   DIVERTICULOSIS-COLON   DYSPHAGIA   CEREBROVASCULAR ACCIDENT, HX OF Possible TIA   Bronchopneumonia    CKD (chronic kidney disease) stage II   Acute on chronic diastolic CHF (congestive heart failure)   Weakness   Tricuspid regurgitation, congenital   Pulmonary hypertension   Discharge Condition: Good  Diet recommendation: Heart healthy  Filed Weights   06/07/12 1500 06/08/12 0606 06/09/12 0500  Weight: 57.3 kg (126 lb 5.2 oz) 58.3 kg (128 lb 8.5 oz) 58.6 kg (129 lb 3 oz)    History of present illness:  The patient is a 79 y.o. year-old female with history of CAD, HTN, HLD, chronic systolic ICM with EF 40-45%, mod to severe aortic insufficiency, multifactorial dyspnea, CKD stage 3/4 who presents with weakness, urinary incontinence, and back pain radiating down the legs. The patient was last at their baseline health Sunday when she developed urinary frequency and some mild generalized weakness. She was given some food and she felt better, but she continued to have urinary incontinence for the next two days. Denies urgency and dysuria. She has had some chills, mild increase in cough with some brown sputum production. No associated LEE, PND, orthopnea, weight gain. Denies diarrhea, has chronic soft to loose stools which have not  changed. Yesterday, her left leg stopped working suddenly but has since started working again. Denies associated left arm weakness or any associated numbness, slurred speech, confusion. Unclear for how long she was unable to move her left leg, but has completely resolved since. She has also had some muscle cramps, particularly on the back and in the bilateral quadriceps.  In the ER, she was mildly tachypneic and tachycardic. CXR and UA were negative for signs of infection, however, UA demonstrated some hemoglobinuria without RBC. WBC was elevated to 19K with neutrophil predominance. Mild normocytic anemia at baseline. She is being admitted for observation for generalized weakness and + SIRS criteria.    Hospital Course:  1. SIRS (systemic inflammatory response syndrome): Leukocytosis, tachycardia, tachypnea. - On admission. Symptoms resolved in a few hours and they did not recur at any point in time during 2 days of observation.  - unclear etiology - Urine culture and blood cultures did not indicate any bacterial growth.  - clearly has mild bronchopneumonia -  - started levaquin iv and she did receive 2 doses prior to being discharged from the hospital. Patient was switched to oral Augmentin 500 mg twice a day for 3 more days at the time of the discharge 2. Left lower extremity transient weakness suggestive of TIA.  - MRI/MRA brain no acute stroke.  - PT eval confirming no needs .  3. Urinary incontinence. No evidence of UTI  - PVR to check for urinary retention  Hemoglobinuria of unclear etiology - CPK WNL  4. Generalized weakness, likely due to underlying infection and anemia  - PT/OT consults - revealed that the weakness  has resolved 5. Leukocytosis. Unclear etiology. silent MI ruled out with normal troponins  urine culture negative  - no diarrhea noted  - improved by 4/24  - resolved by 4/25   Anemia - with hemoglobin drop from baseline of 12.4 on 03/2012 to a level of 9.5 on 4/24. Patient  with history of unexplained anemia in the past requiring transfusions - workup sent out 4/24 records from [facility] reviewed 4/24 . Anemia panel c/w anemia of chronic ds and iron deficiency combined. Stool heme negative. She will need outpt f/u with hematology. No signs of hemolysis, given normal bilirubin normal AST. Hemoglobin level improved to 10.3 prior to discharge  Right ankle sprain-patient was evaluated and placed on an Ace wrap. We recommended ice and elevation   Discharge Exam: Filed Vitals:   06/08/12 0606 06/08/12 1450 06/08/12 2100 06/09/12 0500  BP: 135/55 118/46 130/49 134/57  Pulse: 96 75 72 79  Temp: 98.2 F (36.8 C) 98 F (36.7 C) 98.9 F (37.2 C) 98 F (36.7 C)  TempSrc: Oral Oral Oral Axillary  Resp: 20 19 20 18   Height:      Weight: 58.3 kg (128 lb 8.5 oz)   58.6 kg (129 lb 3 oz)  SpO2: 96% 94% 92% 94%    General: Alert and oriented x3 Cardiovascular: Regular rate and rhythm Respiratory: Clear to auscultation  Discharge Instructions  Discharge Orders   Future Orders Complete By Expires     Diet - low sodium heart healthy  As directed     Increase activity slowly  As directed         Medication List    TAKE these medications       amLODipine 5 MG tablet  Commonly known as:  NORVASC  Take 1 tablet (5 mg total) by mouth daily.     amoxicillin-clavulanate 500-125 MG per tablet  Commonly known as:  AUGMENTIN  Take 1 tablet (500 mg total) by mouth 2 (two) times daily.     aspirin 81 MG EC tablet  Take 1 tablet (81 mg total) by mouth daily.     calcium carbonate 500 MG chewable tablet  Commonly known as:  TUMS - dosed in mg elemental calcium  Chew 3 tablets by mouth daily.     carvedilol 12.5 MG tablet  Commonly known as:  COREG  Take 12.5 mg by mouth 2 (two) times daily with a meal.     Ensure Plus Liqd  Take 237 mLs by mouth daily.     ferrous gluconate 225 (27 FE) MG tablet  Commonly known as:  FERGON  Take 1 tablet (240 mg total) by mouth 3  (three) times daily with meals.     furosemide 20 MG tablet  Commonly known as:  LASIX  Take 0.5 tablets (10 mg total) by mouth daily.     isosorbide dinitrate 10 MG tablet  Commonly known as:  ISORDIL  Take 10 mg by mouth 2 (two) times daily.     levothyroxine 75 MCG tablet  Commonly known as:  SYNTHROID, LEVOTHROID  Take 75 mcg by mouth daily.     Melatonin 3 MG Tabs  Take 6 mg by mouth at bedtime.     nitroGLYCERIN 0.4 MG SL tablet  Commonly known as:  NITROSTAT  Place 0.4 mg under the tongue every 5 (five) minutes as needed. x3 doses as needed for chest pain     pantoprazole 40 MG tablet  Commonly known as:  PROTONIX  Take 40  mg by mouth daily.     potassium chloride 10 MEQ tablet  Commonly known as:  K-DUR,KLOR-CON  Take 1 tablet (10 mEq total) by mouth daily.     temazepam 30 MG capsule  Commonly known as:  RESTORIL  Take 30 mg by mouth at bedtime.     TYLENOL 500 MG tablet  Generic drug:  acetaminophen  Take 500 mg by mouth as needed. Pain/fever           Follow-up Information   Schedule an appointment as soon as possible for a visit with [name], MD.   Contact information:   [facility] [address] [place] [address] [phone]        The results of significant diagnostics from this hospitalization (including imaging, microbiology, ancillary and laboratory) are listed below for reference.    Significant Diagnostic Studies: Dg Chest 2 View  06/08/2012  *RADIOLOGY REPORT*  Clinical Data: Pneumonia, cough  CHEST - 2 VIEW  Comparison: 06/07/2012  Findings: Enlargement of cardiac silhouette with pulmonary vascular congestion. Calcified tortuous aorta. Mild perihilar infiltrates likely edema. Persistent elevation of left diaphragm. No gross pleural effusion or pneumothorax. Bilateral chronic rotator cuff tears.  IMPRESSION: Enlargement of cardiac silhouette with pulmonary vascular congestion and  probable perihilar edema.   Original Report Authenticated By: Ulyses Southward, M.D.    Mr Brain Wo Contrast  06/08/2012  *RADIOLOGY REPORT*  Clinical Data: 79 year old female with weakness, incontinence, pain.  Left lower extremity weakness.  MRI HEAD WITHOUT CONTRAST  Technique:  Multiplanar, multiecho pulse sequences of the brain and surrounding structures were obtained according to standard protocol without intravenous contrast.  Comparison: Head CTs 10/01/2010 and earlier.  Findings: Mild diffusion artifact at the right middle cerebellar peduncle related to a chronic micro hemorrhage (see series 7 image 7).  No convincing area of restricted diffusion to suggest acute infarction.  Stable cerebral volume.  Partially empty sella configuration.  No ventriculomegaly.  Occasional chronic micro hemorrhages in the brain including that at the right middle cerebellar peduncle. No acute intracranial hemorrhage identified.  No midline shift, mass effect, or evidence of mass lesion.  Negative cervicomedullary junction.  Negative for age visualized cervical spine. Major intracranial vascular flow voids are preserved.  Patchy and confluent cerebral white matter T2 and FLAIR hyperintensity. Moderate T2 heterogeneity in the deep gray matter nuclei.  To some extent this represents dilated perivascular spaces.  Similar moderate patchy T2 hyperintensity in the pons.  Normal bone marrow signal.  No acute orbit soft tissue findings. Small left mastoid effusion.  Negative visualized nasopharynx. Negative scalp soft tissues.  IMPRESSION: 1. No acute intracranial abnormality. 2.  Chronic small vessel disease.   Original Report Authenticated By: Erskine Speed, M.D.    Dg Chest Port 1 View  06/07/2012  *RADIOLOGY REPORT*  Clinical Data: Fever and cough.  PORTABLE CHEST - 1 VIEW  Comparison: PA and lateral chest 04/02/2012 and 08/18/2011.  Findings: Heart size is upper normal.  Coarsening of the pulmonary interstitium is again seen.  Lung  volumes are low.  No consolidative process, pneumothorax or effusion. Severe degenerative disease about the shoulders is noted.  IMPRESSION: No acute finding.   Original Report Authenticated By: Holley Dexter, M.D.     Microbiology: Recent Results (from the past 240 hour(s))  URINE CULTURE     Status: None   Collection Time    06/07/12 10:41 AM      Result Value Range Status  Specimen Description URINE, CLEAN CATCH   Final   Special Requests CX ADDED AT 2223 ON 161096   Final   Culture  Setup Time 06/08/2012 00:46   Final   Colony Count NO GROWTH   Final   Culture NO GROWTH   Final   Report Status 06/08/2012 FINAL   Final  MRSA PCR SCREENING     Status: None   Collection Time    06/07/12  9:44 PM      Result Value Range Status   MRSA by PCR NEGATIVE  NEGATIVE Final   Comment:            The GeneXpert MRSA Assay (FDA     approved for NASAL specimens     only), is one component of a     comprehensive MRSA colonization     surveillance program. It is not     intended to diagnose MRSA     infection nor to guide or     monitor treatment for     MRSA infections.  CULTURE, EXPECTORATED SPUTUM-ASSESSMENT     Status: None   Collection Time    06/08/12  9:44 AM      Result Value Range Status   Specimen Description SPUTUM   Final   Special Requests Normal   Final   Sputum evaluation     Final   Value: MICROSCOPIC FINDINGS SUGGEST THAT THIS SPECIMEN IS NOT REPRESENTATIVE OF LOWER RESPIRATORY SECRETIONS. PLEASE RECOLLECT.     Gram Stain Report Called to,Read Back By and Verified With: T. Fairing RN 10:20 06/08/12 ([email or web address])   Report Status 06/08/2012 FINAL   Final     Labs: Basic Metabolic Panel:  Recent Labs Lab 06/07/12 1000 06/08/12 0330 06/09/12 0525  NA 141 140 140  K 4.0 4.4 4.4  CL 106 108 107  CO2 25 24 25   GLUCOSE 95 103* 103*  BUN 38* 38* 37*  CREATININE 1.48* 1.36* 1.21*  CALCIUM 9.8 8.9 9.7   Liver Function Tests:  Recent Labs Lab 06/08/12 0850  AST 18   ALT 8  ALKPHOS 102  BILITOT 0.2*  PROT 7.3  ALBUMIN 2.7*   No results found for this basename: LIPASE, AMYLASE,  in the last 168 hours No results found for this basename: AMMONIA,  in the last 168 hours CBC:  Recent Labs Lab 06/07/12 1000 06/08/12 0330 06/09/12 0525  WBC 19.0* 11.0* 9.1  NEUTROABS 16.7*  --   --   HGB 11.6* 9.5* 10.3*  HCT 34.7* 29.3* 31.2*  MCV 90.6 91.0 90.7  PLT 175 150 188   Cardiac Enzymes:  Recent Labs Lab 06/07/12 1000 06/07/12 1550 06/07/12 2128 06/08/12 0330  CKTOTAL 79  --   --   --   TROPONINI  --  <0.30 <0.30 <0.30   BNP: BNP (last 3 results)  Recent Labs  04/02/12 1834 06/07/12 1000 06/07/12 1550  PROBNP 1761.0* 2076.0* 3203.0*   CBG:  Recent Labs Lab 06/07/12 1646  GLUCAP 85       Signed:  Akansha Wyche  [facility] 06/09/2012, 1:00 PM

## 2012-09-20 ENCOUNTER — Other Ambulatory Visit: Payer: Self-pay

## 2012-09-24 ENCOUNTER — Inpatient Hospital Stay (HOSPITAL_COMMUNITY)
Admission: EM | Admit: 2012-09-24 | Discharge: 2012-09-29 | DRG: 871 | Disposition: A | Discharge status: Home / Self Care | Payer: Medicare Other | Attending: Internal Medicine | Admitting: Internal Medicine

## 2012-09-24 ENCOUNTER — Emergency Department (HOSPITAL_COMMUNITY): Payer: Medicare Other

## 2012-09-24 ENCOUNTER — Encounter (HOSPITAL_COMMUNITY): Payer: Self-pay | Admitting: Emergency Medicine

## 2012-09-24 DIAGNOSIS — R918 Other nonspecific abnormal finding of lung field: Secondary | ICD-10-CM | POA: Diagnosis present

## 2012-09-24 DIAGNOSIS — I359 Nonrheumatic aortic valve disorder, unspecified: Secondary | ICD-10-CM | POA: Diagnosis present

## 2012-09-24 DIAGNOSIS — I2789 Other specified pulmonary heart diseases: Secondary | ICD-10-CM | POA: Diagnosis present

## 2012-09-24 DIAGNOSIS — I129 Hypertensive chronic kidney disease with stage 1 through stage 4 chronic kidney disease, or unspecified chronic kidney disease: Secondary | ICD-10-CM | POA: Diagnosis present

## 2012-09-24 DIAGNOSIS — Z8521 Personal history of malignant neoplasm of larynx: Secondary | ICD-10-CM

## 2012-09-24 DIAGNOSIS — Z79899 Other long term (current) drug therapy: Secondary | ICD-10-CM

## 2012-09-24 DIAGNOSIS — D649 Anemia, unspecified: Secondary | ICD-10-CM | POA: Diagnosis present

## 2012-09-24 DIAGNOSIS — N189 Chronic kidney disease, unspecified: Secondary | ICD-10-CM | POA: Diagnosis present

## 2012-09-24 DIAGNOSIS — R6520 Severe sepsis without septic shock: Secondary | ICD-10-CM | POA: Diagnosis present

## 2012-09-24 DIAGNOSIS — Z8673 Personal history of transient ischemic attack (TIA), and cerebral infarction without residual deficits: Secondary | ICD-10-CM

## 2012-09-24 DIAGNOSIS — Z9861 Coronary angioplasty status: Secondary | ICD-10-CM

## 2012-09-24 DIAGNOSIS — J9620 Acute and chronic respiratory failure, unspecified whether with hypoxia or hypercapnia: Secondary | ICD-10-CM | POA: Diagnosis present

## 2012-09-24 DIAGNOSIS — A419 Sepsis, unspecified organism: Principal | ICD-10-CM | POA: Diagnosis present

## 2012-09-24 DIAGNOSIS — R6510 Systemic inflammatory response syndrome (SIRS) of non-infectious origin without acute organ dysfunction: Secondary | ICD-10-CM

## 2012-09-24 DIAGNOSIS — B961 Klebsiella pneumoniae [K. pneumoniae] as the cause of diseases classified elsewhere: Secondary | ICD-10-CM | POA: Diagnosis present

## 2012-09-24 DIAGNOSIS — I5033 Acute on chronic diastolic (congestive) heart failure: Secondary | ICD-10-CM

## 2012-09-24 DIAGNOSIS — Z7982 Long term (current) use of aspirin: Secondary | ICD-10-CM

## 2012-09-24 DIAGNOSIS — J690 Pneumonitis due to inhalation of food and vomit: Secondary | ICD-10-CM | POA: Diagnosis present

## 2012-09-24 DIAGNOSIS — I509 Heart failure, unspecified: Secondary | ICD-10-CM | POA: Diagnosis present

## 2012-09-24 DIAGNOSIS — I10 Essential (primary) hypertension: Secondary | ICD-10-CM

## 2012-09-24 DIAGNOSIS — G4700 Insomnia, unspecified: Secondary | ICD-10-CM | POA: Diagnosis present

## 2012-09-24 DIAGNOSIS — R0902 Hypoxemia: Secondary | ICD-10-CM

## 2012-09-24 DIAGNOSIS — I2510 Atherosclerotic heart disease of native coronary artery without angina pectoris: Secondary | ICD-10-CM | POA: Diagnosis present

## 2012-09-24 DIAGNOSIS — R1310 Dysphagia, unspecified: Secondary | ICD-10-CM | POA: Diagnosis present

## 2012-09-24 DIAGNOSIS — J189 Pneumonia, unspecified organism: Secondary | ICD-10-CM

## 2012-09-24 DIAGNOSIS — I5023 Acute on chronic systolic (congestive) heart failure: Secondary | ICD-10-CM | POA: Diagnosis present

## 2012-09-24 DIAGNOSIS — D72829 Elevated white blood cell count, unspecified: Secondary | ICD-10-CM

## 2012-09-24 DIAGNOSIS — J45909 Unspecified asthma, uncomplicated: Secondary | ICD-10-CM | POA: Diagnosis present

## 2012-09-24 DIAGNOSIS — Z96659 Presence of unspecified artificial knee joint: Secondary | ICD-10-CM

## 2012-09-24 DIAGNOSIS — K219 Gastro-esophageal reflux disease without esophagitis: Secondary | ICD-10-CM | POA: Diagnosis present

## 2012-09-24 DIAGNOSIS — E039 Hypothyroidism, unspecified: Secondary | ICD-10-CM | POA: Diagnosis present

## 2012-09-24 LAB — POCT I-STAT, CHEM 8
BUN: 29 mg/dL — ABNORMAL HIGH (ref 6–23)
Calcium, Ion: 1.17 mmol/L (ref 1.13–1.30)
Creatinine, Ser: 1.5 mg/dL — ABNORMAL HIGH (ref 0.50–1.10)
Glucose, Bld: 89 mg/dL (ref 70–99)
Sodium: 142 mEq/L (ref 135–145)
TCO2: 25 mmol/L (ref 0–100)

## 2012-09-24 LAB — CBC WITH DIFFERENTIAL/PLATELET
Basophils Relative: 0 % (ref 0–1)
Eosinophils Absolute: 0 10*3/uL (ref 0.0–0.7)
Eosinophils Relative: 0 % (ref 0–5)
Lymphs Abs: 0.6 10*3/uL — ABNORMAL LOW (ref 0.7–4.0)
MCH: 29 pg (ref 26.0–34.0)
MCHC: 31.8 g/dL (ref 30.0–36.0)
MCV: 91.1 fL (ref 78.0–100.0)
Monocytes Absolute: 0.5 10*3/uL (ref 0.1–1.0)
Platelets: 185 10*3/uL (ref 150–400)
RBC: 3.93 MIL/uL (ref 3.87–5.11)
WBC Morphology: INCREASED

## 2012-09-24 MED ORDER — DEXTROSE 5 % IV SOLN
1.0000 g | Freq: Once | INTRAVENOUS | Status: AC
  Administered 2012-09-24: 1 g via INTRAVENOUS
  Filled 2012-09-24: qty 10

## 2012-09-24 MED ORDER — TEMAZEPAM 15 MG PO CAPS
15.0000 mg | ORAL_CAPSULE | Freq: Every day | ORAL | Status: DC
  Administered 2012-09-25: 15 mg via ORAL
  Filled 2012-09-24: qty 1

## 2012-09-24 MED ORDER — AZITHROMYCIN 500 MG PO TABS
500.0000 mg | ORAL_TABLET | ORAL | Status: DC
  Administered 2012-09-25 – 2012-09-28 (×4): 500 mg via ORAL
  Filled 2012-09-24 (×5): qty 1

## 2012-09-24 MED ORDER — ENSURE PUDDING PO PUDG
1.0000 | Freq: Three times a day (TID) | ORAL | Status: DC
  Administered 2012-09-25 – 2012-09-29 (×13): 1 via ORAL
  Filled 2012-09-24: qty 1

## 2012-09-24 MED ORDER — TEMAZEPAM 15 MG PO CAPS
30.0000 mg | ORAL_CAPSULE | Freq: Every day | ORAL | Status: DC
  Administered 2012-09-25: 30 mg via ORAL
  Filled 2012-09-24: qty 2

## 2012-09-24 MED ORDER — LEVOTHYROXINE SODIUM 75 MCG PO TABS
75.0000 ug | ORAL_TABLET | Freq: Every day | ORAL | Status: DC
  Administered 2012-09-25 – 2012-09-29 (×5): 75 ug via ORAL
  Filled 2012-09-24 (×6): qty 1

## 2012-09-24 MED ORDER — FUROSEMIDE 20 MG PO TABS
10.0000 mg | ORAL_TABLET | Freq: Every day | ORAL | Status: DC
  Administered 2012-09-25 – 2012-09-29 (×5): 10 mg via ORAL
  Filled 2012-09-24 (×5): qty 0.5

## 2012-09-24 MED ORDER — ISOSORBIDE DINITRATE 10 MG PO TABS
10.0000 mg | ORAL_TABLET | Freq: Two times a day (BID) | ORAL | Status: DC
  Administered 2012-09-25 – 2012-09-29 (×10): 10 mg via ORAL
  Filled 2012-09-24 (×11): qty 1

## 2012-09-24 MED ORDER — DEXTROSE 5 % IV SOLN
1.0000 g | INTRAVENOUS | Status: DC
  Administered 2012-09-25 – 2012-09-28 (×4): 1 g via INTRAVENOUS
  Filled 2012-09-24 (×5): qty 10

## 2012-09-24 MED ORDER — POTASSIUM CHLORIDE ER 10 MEQ PO TBCR
10.0000 meq | EXTENDED_RELEASE_TABLET | Freq: Every day | ORAL | Status: DC
  Administered 2012-09-25 – 2012-09-29 (×5): 10 meq via ORAL
  Filled 2012-09-24 (×7): qty 1

## 2012-09-24 MED ORDER — AZITHROMYCIN 250 MG PO TABS
500.0000 mg | ORAL_TABLET | Freq: Once | ORAL | Status: AC
  Administered 2012-09-24: 500 mg via ORAL
  Filled 2012-09-24: qty 2

## 2012-09-24 MED ORDER — ACETAMINOPHEN 500 MG PO TABS
1000.0000 mg | ORAL_TABLET | Freq: Four times a day (QID) | ORAL | Status: DC | PRN
  Filled 2012-09-24: qty 2

## 2012-09-24 MED ORDER — CARVEDILOL 12.5 MG PO TABS
12.5000 mg | ORAL_TABLET | Freq: Two times a day (BID) | ORAL | Status: DC
  Administered 2012-09-25 – 2012-09-29 (×9): 12.5 mg via ORAL
  Filled 2012-09-24 (×11): qty 1

## 2012-09-24 MED ORDER — AMLODIPINE BESYLATE 5 MG PO TABS
5.0000 mg | ORAL_TABLET | Freq: Every day | ORAL | Status: DC
  Administered 2012-09-25 – 2012-09-29 (×5): 5 mg via ORAL
  Filled 2012-09-24 (×5): qty 1

## 2012-09-24 MED ORDER — ASPIRIN EC 81 MG PO TBEC
81.0000 mg | DELAYED_RELEASE_TABLET | Freq: Every day | ORAL | Status: DC
  Administered 2012-09-25 – 2012-09-29 (×5): 81 mg via ORAL
  Filled 2012-09-24 (×5): qty 1

## 2012-09-24 MED ORDER — HEPARIN SODIUM (PORCINE) 5000 UNIT/ML IJ SOLN
5000.0000 [IU] | Freq: Three times a day (TID) | INTRAMUSCULAR | Status: DC
  Administered 2012-09-25 – 2012-09-29 (×14): 5000 [IU] via SUBCUTANEOUS
  Filled 2012-09-24 (×17): qty 1

## 2012-09-24 MED ORDER — PANTOPRAZOLE SODIUM 40 MG PO TBEC
40.0000 mg | DELAYED_RELEASE_TABLET | Freq: Every day | ORAL | Status: DC
  Administered 2012-09-25 – 2012-09-29 (×5): 40 mg via ORAL
  Filled 2012-09-24 (×4): qty 1

## 2012-09-24 NOTE — ED Notes (Addendum)
Per EMS: Patient's daughter came to check on her today and noticed the patient O2 Saturation was in the mid 80's. Pt had a oral temp of 101.7. Pt reports increased weakness and cough. Rhonchi auscultated. Some frothy sputum assessed by EMS. No obvious resp. Distress at this time. Ax4.

## 2012-09-24 NOTE — ED Provider Notes (Addendum)
CSN: [id number]     Arrival date & time 09/24/12  1901 History     First MD Initiated Contact with Patient 09/24/12 1903     Chief Complaint  Patient presents with  . Shortness of Breath   (Consider location/radiation/quality/duration/timing/severity/associated sxs/prior Treatment) Patient is a 79 y.o. female presenting with shortness of breath.  Shortness of Breath Associated symptoms: cough and fever    History is given by patient and patient's daughters . Patient with cough and fever onset today Temperature 101.7 at home afternoon. Treated with Tylenol prior to coming here. She's also had cough since today. Cough is productive. Daughter notes that her pulse ox was 85% on room air. Patient wears home oxygen intermittently. Daughter increased her oxygen dosage from 2 L which she wears at baseline 3 L. Patient complains of pain at left lateral chest. Feels like "a pulling. Patient is also had a few episodes of diarrhea today.     Past Medical History  Diagnosis Date  . Hypertension   . Hypothyroidism   . Cardiomegaly   . CHF (congestive heart failure)     Recent hospitalization for CHF & pneumonia -- EF is 40-45%  ( 03/16/10 by Dr. Peter [place] )    . History of rheumatic fever   . Coronary artery disease     EF of 40-45% with history of angioplasty  . Pulmonary hypertension     with PA pressure estimated at 53 mmHg  . Pulmonary edema   . Hypoxia   . Aortic insufficiency     moderate-severe  . Arthritis   . Anemia   . Complication of anesthesia     wakes up slowly  . History of blood transfusion 1980's; 2005    "8 pints w/1st knee OR; 3 pints 2nd knee OR" (06/07/2012)  . Heart murmur     "since age 39" (06/07/2012)  . Asthma   . 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 Shortness of breath     "can happen at any time" (06/07/2012)  . Dyspnea   . Pneumonia     "a few times in my life; used to have it q yr; last time was 01/21/2012"  (06/07/2012)  . CKD (chronic kidney disease) 01/21/2012  . GERD (gastroesophageal reflux disease)   . Stroke 1980's    denies residual on 06/07/2012  . SIRS (systemic inflammatory response syndrome) 06/07/2012   Past Surgical History  Procedure Laterality Date  . Thyroidectomy, partial  1998  . Hammer toe surgery Bilateral 1980's  . Joint replacement      bilat  . Tonsillectomy  1934  . Cataract extraction w/ intraocular lens implant Right 2005  . Replacement total knee Bilateral 1980's; 2006    "right; left" (06/07/2012)  . Knee arthroscopy Right 1980's  . Coronary angioplasty  1990's   Family History  Problem Relation Age of Onset  . Stroke Father   . Ulcers Father   . Asthma Mother   . Cancer Brother   . Cancer Brother   . Cancer Brother   . Dementia Sister   . Congenital heart disease Sister   . Hypertension Son   . Hypertension Son   . Hypertension Son   . Hypertension Son   . Hypertension Son   . Hypertension Son   . Hypertension Son   . Hypertension Daughter   . Hypertension Daughter   . Hypertension Daughter    History  Substance Use  Topics  . Smoking status: Never Smoker   . Smokeless tobacco: Never Used  . Alcohol Use: No   OB History   Grav Para Term Preterm Abortions TAB SAB Ect Mult Living                 Review of Systems  Constitutional: Positive for fever.  HENT: Positive for sneezing.   Respiratory: Positive for cough and shortness of breath.   Cardiovascular: Negative.   Gastrointestinal: Positive for diarrhea.  Musculoskeletal: Negative.   Skin: Negative.   Neurological: Negative.   Psychiatric/Behavioral: Negative.   All other systems reviewed and are negative.    Allergies  Demerol; Doxycycline; Enoxaparin sodium; Hydrocodone-acetaminophen; Loratadine; Oxycodone-acetaminophen; and Red dye  Home Medications   Current Outpatient Rx  Name  Route  Sig  Dispense  Refill  . acetaminophen (TYLENOL) 500 MG tablet   Oral   Take 1,000  mg by mouth every 6 (six) hours as needed for pain. Pain/fever         . amLODipine (NORVASC) 5 MG tablet   Oral   Take 1 tablet (5 mg total) by mouth daily.   10 tablet   0   . aspirin EC 81 MG EC tablet   Oral   Take 1 tablet (81 mg total) by mouth daily.   30 tablet      . calcium carbonate (TUMS - DOSED IN MG ELEMENTAL CALCIUM) 500 MG chewable tablet   Oral   Chew 3 tablets by mouth daily.          . carvedilol (COREG) 12.5 MG tablet   Oral   Take 12.5 mg by mouth 2 (two) times daily with a meal.         . Ensure Plus (ENSURE PLUS) LIQD   Oral   Take 237 mLs by mouth daily.         . ferrous gluconate (FERGON) 225 (27 FE) MG tablet   Oral   Take 1 tablet (240 mg total) by mouth 3 (three) times daily with meals.   90 each   0   . furosemide (LASIX) 20 MG tablet   Oral   Take 0.5 tablets (10 mg total) by mouth daily.   30 tablet   0   . isosorbide dinitrate (ISORDIL) 10 MG tablet   Oral   Take 10 mg by mouth 2 (two) times daily.          Marland Kitchen levothyroxine (SYNTHROID, LEVOTHROID) 75 MCG tablet   Oral   Take 75 mcg by mouth daily.           . Melatonin 3 MG TABS   Oral   Take 6 mg by mouth at bedtime.          . pantoprazole (PROTONIX) 40 MG tablet   Oral   Take 40 mg by mouth daily.         . potassium chloride (K-DUR) 10 MEQ tablet   Oral   Take 10 mEq by mouth daily.         . temazepam (RESTORIL) 15 MG capsule   Oral   Take 15 mg by mouth at bedtime. Takes with 30mg  capsule         . temazepam (RESTORIL) 30 MG capsule   Oral   Take 30 mg by mouth at bedtime. Takes with 15mg  capsule         . nitroGLYCERIN (NITROSTAT) 0.4 MG SL tablet   Sublingual   Place 0.4  mg under the tongue every 5 (five) minutes as needed. x3 doses as needed for chest pain          BP 128/72  Temp(Src) 100.6 F (38.1 C) (Rectal)  Resp 26  SpO2 99% Physical Exam  Nursing note and vitals reviewed. Constitutional:  Chronically ill-appearing   HENT:  Head: Normocephalic and atraumatic.  Eyes: Conjunctivae are normal. Pupils are equal, round, and reactive to light.  Neck: Neck supple. No tracheal deviation present. No thyromegaly present.  Cardiovascular: Normal rate and regular rhythm.   No murmur heard. Pulmonary/Chest: Effort normal. She has no rales.  Rales at bases bilaterally  Abdominal: Soft. Bowel sounds are normal. She exhibits no distension. There is no tenderness.  Musculoskeletal: Normal range of motion. She exhibits no edema and no tenderness.  Neurological: She is alert. Coordination normal.  Skin: Skin is warm and dry. No rash noted.  Psychiatric: She has a normal mood and affect.    ED Course   Procedures (including critical care time)  Labs Reviewed  POCT I-STAT, CHEM 8 - Abnormal; Notable for the following:    BUN 29 (*)    Creatinine, Ser 1.50 (*)    All other components within normal limits  URINE CULTURE  URINALYSIS, ROUTINE W REFLEX MICROSCOPIC   Date: 09/25/2012  Rate:   Rhythm: normal sinus rhythm  QRS Axis: normal  Intervals: normal  ST/T Wave abnormalities: nonspecific T wave changes  Conduction Disutrbances:none  Narrative Interpretation:   Old EKG Reviewed: Rate has decreased from 06/07/2012 otherwise no significant change interpreted by me  No results found. No diagnosis found. Chest x-ray viewed by me Results for orders placed during the hospital encounter of 09/24/12  CBC WITH DIFFERENTIAL      Result Value Range   WBC 12.1 (*) 4.0 - 10.5 K/uL   RBC 3.93  3.87 - 5.11 MIL/uL   Hemoglobin 11.4 (*) 12.0 - 15.0 g/dL   HCT 53.6 (*) 64.4 - 03.4 %   MCV 91.1  78.0 - 100.0 fL   MCH 29.0  26.0 - 34.0 pg   MCHC 31.8  30.0 - 36.0 g/dL   RDW 74.2  59.5 - 63.8 %   Platelets 185  150 - 400 K/uL   Neutrophils Relative % 91 (*) 43 - 77 %   Lymphocytes Relative 5 (*) 12 - 46 %   Monocytes Relative 4  3 - 12 %   Eosinophils Relative 0  0 - 5 %   Basophils Relative 0  0 - 1 %   Neutro  Abs 11.0 (*) 1.7 - 7.7 K/uL   Lymphs Abs 0.6 (*) 0.7 - 4.0 K/uL   Monocytes Absolute 0.5  0.1 - 1.0 K/uL   Eosinophils Absolute 0.0  0.0 - 0.7 K/uL   Basophils Absolute 0.0  0.0 - 0.1 K/uL   WBC Morphology INCREASED BANDS (>20% BANDS)    POCT I-STAT, CHEM 8      Result Value Range   Sodium 142  135 - 145 mEq/L   Potassium 4.3  3.5 - 5.1 mEq/L   Chloride 106  96 - 112 mEq/L   BUN 29 (*) 6 - 23 mg/dL   Creatinine, Ser 7.56 (*) 0.50 - 1.10 mg/dL   Glucose, Bld 89  70 - 99 mg/dL   Calcium, Ion 4.33  2.95 - 1.30 mmol/L   TCO2 25  0 - 100 mmol/L   Hemoglobin 12.6  12.0 - 15.0 g/dL   HCT 18.8  41.6 -  46.0 %  CG4 I-STAT (LACTIC ACID)      Result Value Range   Lactic Acid, Venous 1.17  0.5 - 2.2 mmol/L   Dg Chest 2 View  09/24/2012   *RADIOLOGY REPORT*  Clinical Data: Shortness of breath.  Cough.  Fever.  CHEST - 2 VIEW  Comparison: 06/08/2012  Findings: The patient is rotated to the right on today's exam, resulting in reduced diagnostic sensitivity and specificity. Indistinct airspace opacity noted in the left lung, particularly at the left lower lobe and potentially the posterior lingula.  Interstitial accentuation is present bilaterally.  Borderline cardiomegaly.  IMPRESSION:  1.  Airspace opacity at the left lung base, including the left lower lobe and likely the lingula, potentially from pneumonia or asymmetric edema.  There is mild interstitial accentuation bilaterally. 2.  Borderline cardiomegaly.   Original Report Authenticated By: Gaylyn Rong, M.D.    MDM  Spoke with Dr.Gardner plan admit medical surgical floor. Intravenous antibiotics. Meds #1community-acquired pneumonia #2Renal insufficiency  Doug Sou, MD 09/24/12 [id number]  Doug Sou, MD 09/25/12 [phone]

## 2012-09-24 NOTE — ED Notes (Signed)
Pt placed on bedpan; unable to void at this time.

## 2012-09-24 NOTE — H&P (Signed)
[facility] History and Physical  [name] UUV:[id number] DOB: [birth year] DOA: 09/24/2012  Referring physician: ED PCP: [name], MD  Chief Complaint: SOB  HPI: [name] is a 79 y.o. female who presents to the ED with 1 day h/o SOB, fever of 101.7, O2 sat reading 85% on RA per patients daughter, also developed cough this morning.  Apparently patient wears 2L of O2 at baseline (now up to 3L here).  Pain located in L lateral chest.  Pulling sensation.  In the ED she was documented to be febrile to 100.6, CXR demonstrated LLL opacity.  Patient also had mildly elevated WBC, put on rocephin and azithromycin and admitted for CAP.  Review of Systems: 12 systems reviewed and otherwise negative.  Past Medical History  Diagnosis Date  . Hypertension   . Hypothyroidism   . Cardiomegaly   . CHF (congestive heart failure)     Recent hospitalization for CHF & pneumonia -- EF is 40-45%  ( 03/16/10 by Dr. Peter [place] )    . History of rheumatic fever   . Coronary artery disease     EF of 40-45% with history of angioplasty  . Pulmonary hypertension     with PA pressure estimated at 53 mmHg  . Pulmonary edema   . Hypoxia   . Aortic insufficiency     moderate-severe  . Arthritis   . Anemia   . Complication of anesthesia     wakes up slowly  . History of blood transfusion 1980's; 2005    "8 pints w/1st knee OR; 3 pints 2nd knee OR" (06/07/2012)  . Heart murmur     "since age 89" (06/07/2012)  . Asthma   . 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 Shortness of breath     "can happen at any time" (06/07/2012)  . Dyspnea   . Pneumonia     "a few times in my life; used to have it q yr; last time was 01/21/2012" (06/07/2012)  . CKD (chronic kidney disease) 01/21/2012  . GERD (gastroesophageal reflux disease)   . Stroke 1980's    denies residual on 06/07/2012  . SIRS (systemic inflammatory response syndrome) 06/07/2012   Past  Surgical History  Procedure Laterality Date  . Thyroidectomy, partial  1998  . Hammer toe surgery Bilateral 1980's  . Joint replacement      bilat  . Tonsillectomy  1934  . Cataract extraction w/ intraocular lens implant Right 2005  . Replacement total knee Bilateral 1980's; 2006    "right; left" (06/07/2012)  . Knee arthroscopy Right 1980's  . Coronary angioplasty  1990's   Social History:  reports that she has never smoked. She has never used smokeless tobacco. She reports that she does not drink alcohol or use illicit drugs.   Allergies  Allergen Reactions  . Demerol (Meperidine)     hallucinations  . Doxycycline Hives and Nausea And Vomiting    Headaches   . Enoxaparin Sodium Other (See Comments)    unknown  . Hydrocodone-Acetaminophen Other (See Comments)    Syncope when taken with temazepam  . Loratadine Other (See Comments)    Clariting D-syncope  . Oxycodone-Acetaminophen Itching  . Red Dye Other (See Comments)    Affects veins in 1987    Family History  Problem Relation Age of Onset  . Stroke Father   . Ulcers Father   . Asthma Mother   . Cancer Brother   .  Cancer Brother   . Cancer Brother   . Dementia Sister   . Congenital heart disease Sister   . Hypertension Son   . Hypertension Son   . Hypertension Son   . Hypertension Son   . Hypertension Son   . Hypertension Son   . Hypertension Son   . Hypertension Daughter   . Hypertension Daughter   . Hypertension Daughter    Prior to Admission medications   Medication Sig Start Date End Date Taking? Authorizing Provider  acetaminophen (TYLENOL) 500 MG tablet Take 1,000 mg by mouth every 6 (six) hours as needed for pain. Pain/fever   Yes Historical Provider, MD  amLODipine (NORVASC) 5 MG tablet Take 1 tablet (5 mg total) by mouth daily. 04/02/12  Yes Dione Booze, MD  aspirin EC 81 MG EC tablet Take 1 tablet (81 mg total) by mouth daily. 01/28/12  Yes Rhetta Mura, MD  calcium carbonate (TUMS - DOSED  IN MG ELEMENTAL CALCIUM) 500 MG chewable tablet Chew 3 tablets by mouth daily.    Yes Historical Provider, MD  carvedilol (COREG) 12.5 MG tablet Take 12.5 mg by mouth 2 (two) times daily with a meal.   Yes Historical Provider, MD  Ensure Plus (ENSURE PLUS) LIQD Take 237 mLs by mouth daily.   Yes Historical Provider, MD  ferrous gluconate (FERGON) 225 (27 FE) MG tablet Take 1 tablet (240 mg total) by mouth 3 (three) times daily with meals. 06/09/12  Yes Sorin Luanne Bras, MD  furosemide (LASIX) 20 MG tablet Take 0.5 tablets (10 mg total) by mouth daily. 06/09/12 06/09/13 Yes Sorin Luanne Bras, MD  isosorbide dinitrate (ISORDIL) 10 MG tablet Take 10 mg by mouth 2 (two) times daily.    Yes Historical Provider, MD  levothyroxine (SYNTHROID, LEVOTHROID) 75 MCG tablet Take 75 mcg by mouth daily.     Yes Historical Provider, MD  Melatonin 3 MG TABS Take 6 mg by mouth at bedtime.    Yes Historical Provider, MD  pantoprazole (PROTONIX) 40 MG tablet Take 40 mg by mouth daily.   Yes Historical Provider, MD  potassium chloride (K-DUR) 10 MEQ tablet Take 10 mEq by mouth daily.   Yes Historical Provider, MD  temazepam (RESTORIL) 15 MG capsule Take 15 mg by mouth at bedtime. Takes with 30mg  capsule   Yes Historical Provider, MD  temazepam (RESTORIL) 30 MG capsule Take 30 mg by mouth at bedtime. Takes with 15mg  capsule   Yes Historical Provider, MD  nitroGLYCERIN (NITROSTAT) 0.4 MG SL tablet Place 0.4 mg under the tongue every 5 (five) minutes as needed. x3 doses as needed for chest pain 01/28/12   Rhetta Mura, MD   Physical Exam: Filed Vitals:   09/24/12 2215  BP: 151/55  Pulse: 94  Temp:   Resp: 29    General:  NAD, resting comfortably in bed Eyes: PEERLA EOMI ENT: mucous membranes moist Neck: supple w/o JVD Cardiovascular: RRR w/o MRG Respiratory: crackles in L base, tachypnic but comfortable Abdomen: soft, nt, nd, bs+ Skin: no rash nor lesion Musculoskeletal: MAE, full ROM all 4  extremities Psychiatric: normal tone and affect Neurologic: AAOx3, grossly non-focal  Labs on Admission:  Basic Metabolic Panel:  Recent Labs Lab 09/24/12 2011  NA 142  K 4.3  CL 106  GLUCOSE 89  BUN 29*  CREATININE 1.50*   Liver Function Tests: No results found for this basename: AST, ALT, ALKPHOS, BILITOT, PROT, ALBUMIN,  in the last 168 hours No results found for this basename: LIPASE,  AMYLASE,  in the last 168 hours No results found for this basename: AMMONIA,  in the last 168 hours CBC:  Recent Labs Lab 09/24/12 2011 09/24/12 2027  WBC  --  12.1*  NEUTROABS  --  11.0*  HGB 12.6 11.4*  HCT 37.0 35.8*  MCV  --  91.1  PLT  --  185   Cardiac Enzymes: No results found for this basename: CKTOTAL, CKMB, CKMBINDEX, TROPONINI,  in the last 168 hours  BNP (last 3 results)  Recent Labs  04/02/12 1834 06/07/12 1000 06/07/12 1550  PROBNP 1761.0* 2076.0* 3203.0*   CBG: No results found for this basename: GLUCAP,  in the last 168 hours  Radiological Exams on Admission: Dg Chest 2 View  09/24/2012   *RADIOLOGY REPORT*  Clinical Data: Shortness of breath.  Cough.  Fever.  CHEST - 2 VIEW  Comparison: 06/08/2012  Findings: The patient is rotated to the right on today's exam, resulting in reduced diagnostic sensitivity and specificity. Indistinct airspace opacity noted in the left lung, particularly at the left lower lobe and potentially the posterior lingula.  Interstitial accentuation is present bilaterally.  Borderline cardiomegaly.  IMPRESSION:  1.  Airspace opacity at the left lung base, including the left lower lobe and likely the lingula, potentially from pneumonia or asymmetric edema.  There is mild interstitial accentuation bilaterally. 2.  Borderline cardiomegaly.   Original Report Authenticated By: Gaylyn Rong, M.D.    EKG: Independently reviewed.  Assessment/Plan Principal Problem:   CAP (community acquired pneumonia) Active Problems:   SIRS (systemic  inflammatory response syndrome)   1. CAP with SIRS - Patient with LLL CAP on CXR, treating with rocephin and azithromycin, SIRS as evidenced by WBC and fever, treating fever with PRN tylenol.  O2 via [place], pulse ox monitoring, admitting to inpatient.  Not clinically dehydrated at this point with current BP 151/55 and with long h/o fluid overload in the past so will hold off on extra IVF and allow patient to take in PO (she states she is hungry and thirsty now). 2. HTN - continue home meds    Code Status: Full Code (must indicate code status--if unknown or must be presumed, indicate so) Family Communication: Spoke with daughter at bedside (indicate person spoken with, if applicable, with phone number if by telephone) Disposition Plan: Admit to inpatient (indicate anticipated LOS)  Time spent: 70 min  GARDNER, JARED M. [facility] Pager [phone]  If 7PM-7AM, please contact night-coverage [email or web address] Password [facility] 09/24/2012, 10:22 PM

## 2012-09-24 NOTE — ED Notes (Signed)
Pt requested Ensure.  Physician ordered as requested.  Pt given sprite and graham crackers, refused other foods offered.  Family remains at bedside.

## 2012-09-25 DIAGNOSIS — R0902 Hypoxemia: Secondary | ICD-10-CM

## 2012-09-25 DIAGNOSIS — N189 Chronic kidney disease, unspecified: Secondary | ICD-10-CM

## 2012-09-25 LAB — BASIC METABOLIC PANEL
BUN: 32 mg/dL — ABNORMAL HIGH (ref 6–23)
Chloride: 106 mEq/L (ref 96–112)
GFR calc Af Amer: 32 mL/min — ABNORMAL LOW (ref >90)
Potassium: 4.1 mEq/L (ref 3.5–5.1)

## 2012-09-25 LAB — STREP PNEUMONIAE URINARY ANTIGEN: Strep Pneumo Urinary Antigen: NEGATIVE

## 2012-09-25 LAB — CBC
HCT: 29.8 % — ABNORMAL LOW (ref 36.0–46.0)
Hemoglobin: 9.8 g/dL — ABNORMAL LOW (ref 12.0–15.0)
RBC: 3.28 MIL/uL — ABNORMAL LOW (ref 3.87–5.11)
RDW: 15.4 % (ref 11.5–15.5)
WBC: 13.4 10*3/uL — ABNORMAL HIGH (ref 4.0–10.5)

## 2012-09-25 LAB — EXPECTORATED SPUTUM ASSESSMENT W GRAM STAIN, RFLX TO RESP C

## 2012-09-25 LAB — URINALYSIS, ROUTINE W REFLEX MICROSCOPIC
Bilirubin Urine: NEGATIVE
Hgb urine dipstick: NEGATIVE
Ketones, ur: NEGATIVE mg/dL
Protein, ur: 30 mg/dL — AB
Urobilinogen, UA: 0.2 mg/dL (ref 0.0–1.0)

## 2012-09-25 LAB — HIV ANTIBODY (ROUTINE TESTING W REFLEX): HIV: NONREACTIVE

## 2012-09-25 LAB — URINE MICROSCOPIC-ADD ON

## 2012-09-25 MED ORDER — TRAMADOL HCL 50 MG PO TABS
50.0000 mg | ORAL_TABLET | Freq: Four times a day (QID) | ORAL | Status: DC | PRN
  Administered 2012-09-25 – 2012-09-26 (×3): 50 mg via ORAL
  Filled 2012-09-25 (×3): qty 1

## 2012-09-25 MED ORDER — ZOLPIDEM TARTRATE 5 MG PO TABS
5.0000 mg | ORAL_TABLET | Freq: Every day | ORAL | Status: DC
  Administered 2012-09-25: 5 mg via ORAL
  Filled 2012-09-25: qty 1

## 2012-09-25 NOTE — Progress Notes (Signed)
Attempted to call report. No answer from ED Nurse  Ismerai Bin, RN [phone]

## 2012-09-25 NOTE — Progress Notes (Signed)
[facility] PROGRESS NOTE  [name] ZOX:[id number] DOB: 05/01/1915 DOA: 09/24/2012 PCP: [name], MD  Brief narrative 79 year old female patient with extensive past medical history (HTN, hypothyroidism, chronic systolic CHF, CAD, pulmonary hypertension, aortic insufficiency, chronic kidney disease, throat cancer) was admitted to the hospital on 09/24/12 with one-day history of productive cough, dyspnea and fever of 101.32F. Patient use intermittent home oxygen. Per daughter, O2 sats at home and 85% on room air. In the ED she was documented to be febrile to 100.6, CXR demonstrated LLL opacity. Patient also had mildly elevated WBC, put on rocephin and azithromycin and admitted for CAP.   Assessment/Plan: 1. Community acquired pneumonia with SIRS: Left lower lobe pneumonia chest x-ray. Continue IV Rocephin and azithromycin. Patient clinically feels better. Patient has history of dysphagia, swallow evaluation but refuses modified diet being aware of aspiration risks. 2. Hypoxia: Secondary to pneumonia. Treat as above and monitor. 3. Hypertension: Controlled. 4. Chronic kidney disease: Creatinine close to baseline. 5. Anemia: Probably at baseline. Follow CBC in a.m. 6. Chronic systolic CHF: Appears compensated.  Code Status: Full Family Communication: Discussed with daughter at bedside. Disposition Plan: Home when medically stable.    Consultants:  None  Procedures:  None  Antibiotics:  IV Rocephin 8/10 >  IV azithromycin 8/10 >   HPI/Subjective: Feels better. Dyspnea improved but not at baseline. Intermittent cough productive of minimal blood-tinged sputum.  Objective: Filed Vitals:   09/25/12 0419 09/25/12 1000 09/25/12 1258 09/25/12 1737  BP: 136/58 128/60 115/51 111/49  Pulse: 83 79 76 62  Temp: 98.6 F (37 C) 98.5 F (36.9 C) 98.7 F (37.1 C) 98.2 F (36.8 C)  TempSrc: Oral Oral Oral Oral  Resp: 28 20 20 20   Height:      Weight:      SpO2:  100% 100% 98% 100%    Intake/Output Summary (Last 24 hours) at 09/25/12 1821 Last data filed at 09/25/12 0900  Gross per 24 hour  Intake    240 ml  Output    225 ml  Net     15 ml   Filed Weights   09/25/12 0114  Weight: 59.194 kg (130 lb 8 oz)    Exam:   General exam: Comfortable. Elderly frail pleasant female.  Respiratory system: Reduced breath sounds bilaterally with scattered bilateral expiratory rhonchi and occasional basal crackles. No increased work of breathing.  Cardiovascular system: S1 & S2 heard, RRR. No JVD, murmurs, gallops, clicks or pedal edema.  Gastrointestinal system: Abdomen is nondistended, soft and nontender. Normal bowel sounds heard.  Central nervous system: Alert and oriented. No focal neurological deficits.  Extremities: Symmetric 5 x 5 power.   Data Reviewed: Basic Metabolic Panel:  Recent Labs Lab 09/24/12 2011 09/25/12 0530  NA 142 141  K 4.3 4.1  CL 106 106  CO2  --  27  GLUCOSE 89 76  BUN 29* 32*  CREATININE 1.50* 1.55*  CALCIUM  --  8.8   Liver Function Tests: No results found for this basename: AST, ALT, ALKPHOS, BILITOT, PROT, ALBUMIN,  in the last 168 hours No results found for this basename: LIPASE, AMYLASE,  in the last 168 hours No results found for this basename: AMMONIA,  in the last 168 hours CBC:  Recent Labs Lab 09/24/12 2011 09/24/12 2027 09/25/12 0530  WBC  --  12.1* 13.4*  NEUTROABS  --  11.0*  --   HGB 12.6 11.4* 9.8*  HCT 37.0 35.8* 29.8*  MCV  --  91.1  90.9  PLT  --  185 157   Cardiac Enzymes: No results found for this basename: CKTOTAL, CKMB, CKMBINDEX, TROPONINI,  in the last 168 hours BNP (last 3 results)  Recent Labs  04/02/12 1834 06/07/12 1000 06/07/12 1550  PROBNP 1761.0* 2076.0* 3203.0*   CBG: No results found for this basename: GLUCAP,  in the last 168 hours  Recent Results (from the past 240 hour(s))  CULTURE, EXPECTORATED SPUTUM-ASSESSMENT     Status: None   Collection Time     09/25/12 10:43 AM      Result Value Range Status   Specimen Description SPUTUM   Final   Special Requests NONE   Final   Sputum evaluation     Final   Value: THIS SPECIMEN IS ACCEPTABLE. RESPIRATORY CULTURE REPORT TO FOLLOW.   Report Status 09/25/2012 FINAL   Final     Studies: Dg Chest 2 View  09/24/2012   *RADIOLOGY REPORT*  Clinical Data: Shortness of breath.  Cough.  Fever.  CHEST - 2 VIEW  Comparison: 06/08/2012  Findings: The patient is rotated to the right on today's exam, resulting in reduced diagnostic sensitivity and specificity. Indistinct airspace opacity noted in the left lung, particularly at the left lower lobe and potentially the posterior lingula.  Interstitial accentuation is present bilaterally.  Borderline cardiomegaly.  IMPRESSION:  1.  Airspace opacity at the left lung base, including the left lower lobe and likely the lingula, potentially from pneumonia or asymmetric edema.  There is mild interstitial accentuation bilaterally. 2.  Borderline cardiomegaly.   Original Report Authenticated By: Gaylyn Rong, M.D.     Additional labs:   Scheduled Meds: . amLODipine  5 mg Oral Daily  . aspirin EC  81 mg Oral Daily  . azithromycin  500 mg Oral Q24H  . carvedilol  12.5 mg Oral BID WC  . cefTRIAXone (ROCEPHIN)  IV  1 g Intravenous Q24H  . feeding supplement  1 Container Oral TID BM  . furosemide  10 mg Oral Daily  . heparin  5,000 Units Subcutaneous Q8H  . isosorbide dinitrate  10 mg Oral BID  . levothyroxine  75 mcg Oral QAC breakfast  . pantoprazole  40 mg Oral Daily  . potassium chloride  10 mEq Oral Daily  . zolpidem  5 mg Oral QHS   Continuous Infusions:   Principal Problem:   CAP (community acquired pneumonia) Active Problems:   SIRS (systemic inflammatory response syndrome)    Time spent: 35 minutes    [facility]  [facility] Pager [phone].   If 8PM-8AM, please contact night-coverage at [email or web address], password [facility] 09/25/2012,  6:21 PM  LOS: 1 day

## 2012-09-25 NOTE — Progress Notes (Signed)
New Admission Note:   Arrival Method: Via stretcher with EMT  Mental Orientation: Telemetry: N/A Assessment: Completed Skin: No skin issues. Warm, dry and intact  IV: Two IVs intact Pain: No pain  Tubes: N/A Safety Measures: Fall prevention given and discussed Admission: Completed 6700 Orientation: Patient and family member oriented to the unit, room and staff. Family: Daughter at bedside  Orders have been reviewed and implemented. Will continue to monitor the patient. Call light has been placed within reach and bed alarm has been activated.  Most night time medication was given in the ED by the Nurse. EMT did bring two medications with her. Placed in patients medication draw.   Doristine Devoid, RN  Phone number: [phone]

## 2012-09-25 NOTE — ED Notes (Signed)
Up to floor with EMT. Daughter at [facility]. Pt requesting ensure as bedtime snack.

## 2012-09-26 DIAGNOSIS — J9620 Acute and chronic respiratory failure, unspecified whether with hypoxia or hypercapnia: Secondary | ICD-10-CM | POA: Diagnosis present

## 2012-09-26 DIAGNOSIS — I509 Heart failure, unspecified: Secondary | ICD-10-CM

## 2012-09-26 DIAGNOSIS — I5023 Acute on chronic systolic (congestive) heart failure: Secondary | ICD-10-CM | POA: Diagnosis present

## 2012-09-26 LAB — BASIC METABOLIC PANEL
BUN: 37 mg/dL — ABNORMAL HIGH (ref 6–23)
CO2: 26 mEq/L (ref 19–32)
Chloride: 102 mEq/L (ref 96–112)
Creatinine, Ser: 1.53 mg/dL — ABNORMAL HIGH (ref 0.50–1.10)

## 2012-09-26 LAB — CBC
HCT: 30.5 % — ABNORMAL LOW (ref 36.0–46.0)
MCV: 92.1 fL (ref 78.0–100.0)
RBC: 3.31 MIL/uL — ABNORMAL LOW (ref 3.87–5.11)
RDW: 15.6 % — ABNORMAL HIGH (ref 11.5–15.5)
WBC: 12.6 10*3/uL — ABNORMAL HIGH (ref 4.0–10.5)

## 2012-09-26 LAB — URINE CULTURE

## 2012-09-26 MED ORDER — TEMAZEPAM 15 MG PO CAPS
30.0000 mg | ORAL_CAPSULE | Freq: Every day | ORAL | Status: DC
  Administered 2012-09-26 – 2012-09-28 (×3): 30 mg via ORAL
  Filled 2012-09-26 (×3): qty 2

## 2012-09-26 MED ORDER — WHITE PETROLATUM GEL
Status: AC
  Administered 2012-09-26: 0.2
  Filled 2012-09-26: qty 5

## 2012-09-26 MED ORDER — FUROSEMIDE 10 MG/ML IJ SOLN
20.0000 mg | Freq: Once | INTRAMUSCULAR | Status: AC
  Administered 2012-09-26: 20 mg via INTRAVENOUS
  Filled 2012-09-26: qty 2

## 2012-09-26 MED ORDER — ARTIFICIAL TEARS OP OINT
TOPICAL_OINTMENT | Freq: Three times a day (TID) | OPHTHALMIC | Status: DC
  Administered 2012-09-26 (×3): via OPHTHALMIC
  Filled 2012-09-26: qty 3.5

## 2012-09-26 MED ORDER — ACETAMINOPHEN 325 MG PO TABS: 650.0000 mg | ORAL_TABLET | Freq: Four times a day (QID) | ORAL | Status: DC | PRN

## 2012-09-26 NOTE — Evaluation (Signed)
Physical Therapy Evaluation Patient Details Name: [name] MRN: [id number] DOB: 1915-12-07 Today's Date: 09/26/2012 Time: 4098-1191 PT Time Calculation (min): 20 min  PT Assessment / Plan / Recommendation History of Present Illness  79 year old female patient with extensive past medical history (HTN, hypothyroidism, chronic systolic CHF, CAD, pulmonary hypertension, aortic insufficiency, chronic kidney disease, throat cancer) was admitted to the hospital on 09/24/12 with one-day history of productive cough, dyspnea and fever of 101.25F. Patient use intermittent home oxygen. Per daughter, O2 sats at home and 85% on room air. In the ED she was documented to be febrile to 100.6, CXR demonstrated LLL opacity. Patient also had mildly elevated WBC, put on rocephin and azithromycin and admitted for CAP.   Clinical Impression  Pt admitted with above. Pt currently with functional limitations due to the deficits listed below (see PT Problem List).  Pt will benefit from skilled PT to increase their independence and safety with mobility to allow discharge to the venue listed below.  Daughter agreeable with plan for PT to see pt in acute to continue mobilizing and prepare for d/c home with family assist however does not feel pt will need PT once home.  Daughter states granddaughter comes over and does exercises with pt.     PT Assessment  Patient needs continued PT services    Follow Up Recommendations  No PT follow up    Does the patient have the potential to tolerate intense rehabilitation      Barriers to Discharge        Equipment Recommendations  None recommended by PT    Recommendations for Other Services     Frequency Min 3X/week    Precautions / Restrictions Precautions Precautions: Fall Precaution Comments: chronic O2   Pertinent Vitals/Pain Maintained 3L O2 [place], reports feeling "drunk" on pain meds, mod right flank pain reported with mobility (pt feels she pulled a muscle, denies  falls)      Mobility  Bed Mobility Bed Mobility: Supine to Sit;Sitting - Scoot to Edge of Bed Supine to Sit: 4: Min assist Sitting - Scoot to Edge of Bed: 3: Mod assist Transfers Transfers: Sit to Stand;Stand to [employer] Transfers Sit to Stand: 4: Min guard;With upper extremity assist Stand to Sit: 4: Min guard;With upper extremity assist Stand Pivot Transfers: 4: Min guard;With armrests Details for Transfer Assistance: depends on UE assist, verbal cues for safe technique Ambulation/Gait Ambulation/Gait Assistance: 4: Min assist Ambulation Distance (Feet): 40 Feet Assistive device: Rolling walker Ambulation/Gait Assistance Details: limited by fatigue, verbal cues for safe use of RW, ambulated on 3L O2 [place] Gait Pattern: Step-through pattern;Trunk flexed;Decreased stride length Gait velocity: decreased    Exercises     PT Diagnosis: Difficulty walking  PT Problem List: Decreased strength;Decreased activity tolerance;Decreased mobility PT Treatment Interventions: DME instruction;Gait training;Functional mobility training;Therapeutic activities;Therapeutic exercise;Patient/family education     PT Goals(Current goals can be found in the care plan section) Acute Rehab PT Goals PT Goal Formulation: With patient/family Time For Goal Achievement: 10/03/12 Potential to Achieve Goals: Good  Visit Information  Last PT Received On: 09/26/12 Assistance Needed: +1 History of Present Illness: 79 year old female patient with extensive past medical history (HTN, hypothyroidism, chronic systolic CHF, CAD, pulmonary hypertension, aortic insufficiency, chronic kidney disease, throat cancer) was admitted to the hospital on 09/24/12 with one-day history of productive cough, dyspnea and fever of 101.25F. Patient use intermittent home oxygen. Per daughter, O2 sats at home and 85% on room air. In the ED she was documented  to be febrile to 100.6, CXR demonstrated LLL opacity. Patient also had  mildly elevated WBC, put on rocephin and azithromycin and admitted for CAP.       Prior Functioning  Home Living Family/patient expects to be discharged to:: Private residence Living Arrangements: Alone;Other (Comment) (however family has 24/7 rotation schedule) Available Help at Discharge: Available 24 hours/day;Family Type of Home: House Home Access: Ramped entrance Home Layout: One level Home Equipment: Walker - 2 wheels;Wheelchair - manual;Shower seat;Bedside commode Prior Function Level of Independence: Needs assistance Comments: Daughter reports pt attempts to maintain as much independence as possible, pt reports able to bath and dress herself as well as ambulate around home with RW Communication Communication: No difficulties    Cognition  Cognition Arousal/Alertness: Awake/alert Behavior During Therapy: WFL for tasks assessed/performed Overall Cognitive Status: Within Functional Limits for tasks assessed    Extremity/Trunk Assessment Lower Extremity Assessment Lower Extremity Assessment: Generalized weakness   Balance    End of Session PT - End of Session Equipment Utilized During Treatment: Gait belt;Oxygen Activity Tolerance: Patient limited by fatigue Patient left: in bed;with call bell/phone within reach;with family/visitor present  GP     [name],[name] 09/26/2012, 3:56 PM Zenovia Jarred, PT, DPT 09/26/2012 Pager: [phone]

## 2012-09-26 NOTE — Clinical Documentation Improvement (Signed)
THIS DOCUMENT IS NOT A PERMANENT PART OF THE MEDICAL RECORD  Please update your documentation with the medical record to reflect your response to this query. If you need help knowing how to do this please call [phone].  09/26/12  Dear Dr. Angie Fava sociates,  In a better effort to capture your patient's severity of illness, reflect appropriate length of stay and utilization of resources, a review of the patient medical record has revealed the following indicators.    Based on your clinical judgment, please clarify and document in a progress note and/or discharge summary the clinical condition associated with the following supporting information:  In responding to this query please exercise your independent judgment.  The fact that a query is asked, does not imply that any particular answer is desired or expected.  Possible Clinical Conditions?  _______Acute Respiratory Failure _______Acute on Chronic Respiratory Failure _______Chronic Respiratory Failure _______Other Condition________________ _______Cannot Clinically Determine    Supporting Information:  Signs&Symptoms:(As per notes) "Hypoxia: Secondary to pneumonia", "Patient use intermittent home oxygen. Per daughter, O2 sats at home and 85% on room air."  Radiology: CXR 09-24-12 IMPRESSION:  1. Airspace opacity at the left lung base, including the left  lower lobe and likely the lingula, potentially from pneumonia or  asymmetric edema. There is mild interstitial accentuation  bilaterally.  2. Borderline cardiomegaly.  Treatment: Oxygen: O2 concentrations: 3 l/m [place]  Treatment: (As per notes)  IV Rocephin 8/10 > IV azithromycin 8/10 >                   You may use possible, probable, or suspect with inpatient documentation. possible, probable, suspected diagnoses MUST be documented at the time of discharge  Reviewed: additional documentation in the medical record  Thank You,  Nevin Bloodgood RN, BSN, CCDS  Clinical Documentation Specialist: [phone] Cell= [phone] Health Information Management [place]

## 2012-09-26 NOTE — Evaluation (Signed)
Clinical/Bedside Swallow Evaluation Patient Details  Name: [name] MRN: [id number] Date of Birth: May 05, 1915  Today's Date: 09/26/2012 Time: 1545-1610 SLP Time Calculation (min): 25 min  Past Medical History:  Past Medical History  Diagnosis Date  . Hypertension   . Hypothyroidism   . Cardiomegaly   . CHF (congestive heart failure)     Recent hospitalization for CHF & pneumonia -- EF is 40-45%  ( 03/16/10 by Dr. Peter Sw[place] )    . History of rheumatic fever   . Coronary artery disease     EF of 40-45% with history of angioplasty  . Pulmonary hypertension     with PA pressure estimated at 53 mmHg  . Pulmonary edema   . Hypoxia   . Aortic insufficiency     moderate-severe  . Arthritis   . Anemia   . Complication of anesthesia     wakes up slowly  . History of blood transfusion 1980's; 2005    "8 pints w/1st knee OR; 3 pints 2nd knee OR" (06/07/2012)  . Heart murmur     "since age 64" (06/07/2012)  . Asthma   . 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 Shortness of breath     "can happen at any time" (06/07/2012)  . Dyspnea   . Pneumonia     "a few times in my life; used to have it q yr; last time was 01/21/2012" (06/07/2012)  . CKD (chronic kidney disease) 01/21/2012  . GERD (gastroesophageal reflux disease)   . Stroke 1980's    denies residual on 06/07/2012  . SIRS (systemic inflammatory response syndrome) 06/07/2012   Past Surgical History:  Past Surgical History  Procedure Laterality Date  . Thyroidectomy, partial  1998  . Hammer toe surgery Bilateral 1980's  . Joint replacement      bilat  . Tonsillectomy  1934  . Cataract extraction w/ intraocular lens implant Right 2005  . Replacement total knee Bilateral 1980's; 2006    "right; left" (06/07/2012)  . Knee arthroscopy Right 1980's  . Coronary angioplasty  1990's   HPI:  79 year old female patient with extensive past medical history (HTN, hypothyroidism, chronic  systolic CHF, CAD, pulmonary hypertension, aortic insufficiency, chronic kidney disease, throat cancer) was admitted to the hospital on 09/24/12 with one-day history of productive cough, dyspnea and fever of 101.65F. Patient use intermittent home oxygen. Per daughter, O2 sats at home and 85% on room air. In the ED she was documented to be febrile to 100.6, CXR demonstrated LLL opacity. Patient also had mildly elevated WBC, put on rocephin and azithromycin and admitted for CAP.   Assessment / Plan / Recommendation Clinical Impression  Pt presents with h/o oropharyngeal and esophageal dysphagia s/p XRT for nasopharyngeal cancer, and noncompliant with recommendations for nectar thick liquids at home. Today she exhibits decreased hyolaryngeal movement, multiple swallows per bolus, and wet vocal quality with thin liquids which is cleared with cued throat clear. Although pt has chosen not to thicken her liquids in the past, given recent illness pt and daughter agree that they would like objective testing prior to modifying diet to assess aspiration risk. Recommend Dys. 3 textures (pt's baseline per daughter) and thin liquids with intermittent throat clear until objective testing can be done.    Aspiration Risk  Mild    Diet Recommendation Dysphagia 3 (Mechanical Soft);Thin liquid   Liquid Administration via: Cup;No straw Medication Administration: Whole meds with puree Supervision: Patient able  to self feed;Intermittent supervision to cue for compensatory strategies Compensations: Slow rate;Small sips/bites;Clear throat intermittently Postural Changes and/or Swallow Maneuvers: Seated upright 90 degrees;Upright 30-60 min after meal    Other  Recommendations Recommended Consults: MBS Oral Care Recommendations: Oral care BID   Follow Up Recommendations  Other (comment) (TBD pending objective testing)    Frequency and Duration        Pertinent Vitals/Pain N/A    SLP Swallow Goals  TBD pending  objective testing   Swallow Study Prior Functional Status  Type of Home: House Available Help at Discharge: Available 24 hours/day;Family    General Date of Onset: 09/24/12 HPI: 79 year old female patient with extensive past medical history (HTN, hypothyroidism, chronic systolic CHF, CAD, pulmonary hypertension, aortic insufficiency, chronic kidney disease, throat cancer) was admitted to the hospital on 09/24/12 with one-day history of productive cough, dyspnea and fever of 101.7F. Patient use intermittent home oxygen. Per daughter, O2 sats at home and 85% on room air. In the ED she was documented to be febrile to 100.6, CXR demonstrated LLL opacity. Patient also had mildly elevated WBC, put on rocephin and azithromycin and admitted for CAP. Type of Study: Bedside swallow evaluation Previous Swallow Assessment: MBS 2012, recommend nectar thick liquids Diet Prior to this Study: Regular;Thin liquids Temperature Spikes Noted: No Respiratory Status: Supplemental O2 delivered via (comment) ([place] (3L)) History of Recent Intubation: No Behavior/Cognition: Alert;Cooperative;Pleasant mood Oral Cavity - Dentition: Dentures, top;Dentures, bottom Self-Feeding Abilities: Able to feed self Patient Positioning: Upright in bed Baseline Vocal Quality: Hoarse;Other (comment) (s/p radiation tx for nasopharyngeal cancer) Volitional Cough: Congested Volitional Swallow: Able to elicit    Oral/Motor/Sensory Function Overall Oral Motor/Sensory Function: Appears within functional limits for tasks assessed   Ice Chips Ice chips: Not tested   Thin Liquid Thin Liquid: Impaired Presentation: Cup;Self Fed Pharyngeal  Phase Impairments: Decreased hyoid-laryngeal movement;Multiple swallows;Wet Vocal Quality    Nectar Thick Nectar Thick Liquid: Not tested   Honey Thick Honey Thick Liquid: Not tested   Puree Puree: Impaired Presentation: Self Fed;Spoon Pharyngeal Phase Impairments: Decreased hyoid-laryngeal  movement;Multiple swallows   Solid   GO    Solid: Impaired Presentation: Self Fed Oral Phase Functional Implications: Oral residue (trace-mild) Pharyngeal Phase Impairments: Decreased hyoid-laryngeal movement;Multiple swallows       Maxcine Ham 09/26/2012,4:30 PM  Maxcine Ham, M.A. CCC-SLP

## 2012-09-26 NOTE — Progress Notes (Addendum)
[facility] PROGRESS NOTE  [name]:[id number] DOB: [birth year] DOA: 09/24/2012 PCP: [name], MD  Brief narrative 79 year old female patient with extensive past medical history (HTN, hypothyroidism, chronic systolic CHF, CAD, pulmonary hypertension, aortic insufficiency, chronic kidney disease, throat cancer) was admitted to the hospital on 09/24/12 with one-day history of productive cough, dyspnea and fever of 101.35F. Patient use intermittent home oxygen. Per daughter, O2 sats at home and 85% on room air. In the ED she was documented to be febrile to 100.6, CXR demonstrated LLL opacity. Patient also had mildly elevated WBC, put on rocephin and azithromycin and admitted for CAP.   Assessment/Plan: 1. Community acquired pneumonia/? Aspiration PNA, with SIRS on admission: Left lower lobe pneumonia chest x-ray. Continue IV Rocephin and azithromycin. Patient clinically feels better. Patient has history of dysphagia, swallow evaluation but refuses modified diet being aware of aspiration risks. Agreed to swallow eval today- requested. 2. Hypoxia/Acute on chronic hypoxic respiratory failure: Secondary to pneumonia & ? decompensated CHF. Treat underlying cause and monitor. 3. Hypertension: Controlled. 4. Chronic kidney disease: Creatinine close to baseline. 5. Anemia: Probably at baseline. stable 6. Acute on Chronic systolic CHF: Lung exam today appears wet. Will give a dose of IV Lasix and monitor. 7. Insomnia: patient and daughter are concerned that patient has been on temazepam for the last 50 years and not getting it now. She used to be on 30 mg QHS for almost 10 years until a month ago when it was increased to 45 mg daily by her PCP. Same was stopped in the hospital. Discussed with pharmacy who are concerned regarding higher doses in this elderly patient. However, given that she has been on it for a long time and abruptly stopping may lead to withdrawal side effects, we'll  resume at 30 mg at bedtime. Monitor  Code Status: Full Family Communication: Discussed with daughter at bedside. Disposition Plan: Home when medically stable.    Consultants:  None  Procedures:  None  Antibiotics:  IV Rocephin 8/10 >  IV azithromycin 8/10 >   HPI/Subjective: Dyspnea better- but had a 'spell' yesterday evening. Left mid back pain on moving. Dry eyes.  Objective: Filed Vitals:   09/25/12 2025 09/25/12 2057 09/26/12 0527 09/26/12 0930  BP:  119/49 120/30 120/49  Pulse:  75 78 79  Temp:  98.6 F (37 C) 98.5 F (36.9 C) 98.6 F (37 C)  TempSrc:  Oral Oral Oral  Resp:  22 20 19   Height:  5\' 2"  (1.575 m)    Weight:  59.194 kg (130 lb 8 oz)    SpO2: 98% 100% 100% 100%    Intake/Output Summary (Last 24 hours) at 09/26/12 1202 Last data filed at 09/26/12 1126  Gross per 24 hour  Intake    230 ml  Output    770 ml  Net   -540 ml   Filed Weights   09/25/12 0114 09/25/12 2057  Weight: 59.194 kg (130 lb 8 oz) 59.194 kg (130 lb 8 oz)    Exam:   General exam: Comfortable. Elderly frail pleasant female.  Respiratory system: Reduced breath sounds bilaterally with scattered bilateral expiratory rhonchi and basal crackles. No increased work of breathing.  Cardiovascular system: S1 & S2 heard, RRR. No JVD, murmurs, gallops, clicks. Trace pedal edema.  Gastrointestinal system: Abdomen is nondistended, soft and nontender. Normal bowel sounds heard.  Central nervous system: Alert and oriented. No focal neurological deficits.  Extremities: Symmetric 5 x 5 power.  MSS. No acute findings  or tenderness on back exam.   Data Reviewed: Basic Metabolic Panel:  Recent Labs Lab 09/24/12 2011 09/25/12 0530 09/26/12 0530  NA 142 141 138  K 4.3 4.1 4.1  CL 106 106 102  CO2  --  27 26  GLUCOSE 89 76 79  BUN 29* 32* 37*  CREATININE 1.50* 1.55* 1.53*  CALCIUM  --  8.8 9.2   Liver Function Tests: No results found for this basename: AST, ALT, ALKPHOS,  BILITOT, PROT, ALBUMIN,  in the last 168 hours No results found for this basename: LIPASE, AMYLASE,  in the last 168 hours No results found for this basename: AMMONIA,  in the last 168 hours CBC:  Recent Labs Lab 09/24/12 2011 09/24/12 2027 09/25/12 0530 09/26/12 0530  WBC  --  12.1* 13.4* 12.6*  NEUTROABS  --  11.0*  --   --   HGB 12.6 11.4* 9.8* 9.7*  HCT 37.0 35.8* 29.8* 30.5*  MCV  --  91.1 90.9 92.1  PLT  --  185 157 156   Cardiac Enzymes: No results found for this basename: CKTOTAL, CKMB, CKMBINDEX, TROPONINI,  in the last 168 hours BNP (last 3 results)  Recent Labs  04/02/12 1834 06/07/12 1000 06/07/12 1550  PROBNP 1761.0* 2076.0* 3203.0*   CBG: No results found for this basename: GLUCAP,  in the last 168 hours  Recent Results (from the past 240 hour(s))  CULTURE, BLOOD (ROUTINE X 2)     Status: None   Collection Time    09/24/12  8:55 PM      Result Value Range Status   Specimen Description BLOOD RIGHT ARM   Final   Special Requests BOTTLES DRAWN AEROBIC ONLY 10CC   Final   Culture  Setup Time     Final   Value: 09/25/2012 02:39     Performed at [facility]   Culture     Final   Value:        BLOOD CULTURE RECEIVED NO GROWTH TO DATE CULTURE WILL BE HELD FOR 5 DAYS BEFORE ISSUING A FINAL NEGATIVE REPORT     Performed at [facility]   Report Status PENDING   Incomplete  CULTURE, BLOOD (ROUTINE X 2)     Status: None   Collection Time    09/24/12  9:03 PM      Result Value Range Status   Specimen Description BLOOD RIGHT ARM   Final   Special Requests BOTTLES DRAWN AEROBIC ONLY 10CC   Final   Culture  Setup Time     Final   Value: 09/25/2012 02:39     Performed at [facility]   Culture     Final   Value:        BLOOD CULTURE RECEIVED NO GROWTH TO DATE CULTURE WILL BE HELD FOR 5 DAYS BEFORE ISSUING A FINAL NEGATIVE REPORT     Performed at [facility]   Report Status PENDING   Incomplete  CULTURE, EXPECTORATED  SPUTUM-ASSESSMENT     Status: None   Collection Time    09/25/12 10:43 AM      Result Value Range Status   Specimen Description SPUTUM   Final   Special Requests NONE   Final   Sputum evaluation     Final   Value: THIS SPECIMEN IS ACCEPTABLE. RESPIRATORY CULTURE REPORT TO FOLLOW.   Report Status 09/25/2012 FINAL   Final  CULTURE, RESPIRATORY (NON-EXPECTORATED)     Status: None   Collection Time  09/25/12 10:43 AM      Result Value Range Status   Specimen Description SPUTUM   Final   Special Requests NONE   Final   Gram Stain     Final   Value: ABUNDANT WBC PRESENT,BOTH PMN AND MONONUCLEAR     FEW SQUAMOUS EPITHELIAL CELLS PRESENT     RARE GRAM NEGATIVE RODS     Performed at [facility]     Performed at [facility]   Culture     Final   Value: Culture reincubated for better growth     Performed at [facility]   Report Status PENDING   Incomplete     Studies: Dg Chest 2 View  09/24/2012   *RADIOLOGY REPORT*  Clinical Data: Shortness of breath.  Cough.  Fever.  CHEST - 2 VIEW  Comparison: 06/08/2012  Findings: The patient is rotated to the right on today's exam, resulting in reduced diagnostic sensitivity and specificity. Indistinct airspace opacity noted in the left lung, particularly at the left lower lobe and potentially the posterior lingula.  Interstitial accentuation is present bilaterally.  Borderline cardiomegaly.  IMPRESSION:  1.  Airspace opacity at the left lung base, including the left lower lobe and likely the lingula, potentially from pneumonia or asymmetric edema.  There is mild interstitial accentuation bilaterally. 2.  Borderline cardiomegaly.   Original Report Authenticated By: Gaylyn Rong, M.D.     Additional labs:   Scheduled Meds: . amLODipine  5 mg Oral Daily  . artificial tears   Both Eyes Q8H  . aspirin EC  81 mg Oral Daily  . azithromycin  500 mg Oral Q24H  . carvedilol  12.5 mg Oral BID WC  . cefTRIAXone (ROCEPHIN)  IV   1 g Intravenous Q24H  . feeding supplement  1 Container Oral TID BM  . furosemide  10 mg Oral Daily  . heparin  5,000 Units Subcutaneous Q8H  . isosorbide dinitrate  10 mg Oral BID  . levothyroxine  75 mcg Oral QAC breakfast  . pantoprazole  40 mg Oral Daily  . potassium chloride  10 mEq Oral Daily  . temazepam  30 mg Oral QHS   Continuous Infusions:   Principal Problem:   CAP (community acquired pneumonia) Active Problems:   HYPERTENSION   CKD (chronic kidney disease)   SIRS (systemic inflammatory response syndrome)   Hypoxia    Time spent: 35 minutes    [facility]  [facility] Pager [phone].   If 8PM-8AM, please contact night-coverage at [email or web address], password [facility] 09/26/2012, 12:02 PM  LOS: 2 days

## 2012-09-27 ENCOUNTER — Inpatient Hospital Stay (HOSPITAL_COMMUNITY): Payer: Medicare Other

## 2012-09-27 DIAGNOSIS — I5033 Acute on chronic diastolic (congestive) heart failure: Secondary | ICD-10-CM

## 2012-09-27 DIAGNOSIS — D72829 Elevated white blood cell count, unspecified: Secondary | ICD-10-CM

## 2012-09-27 LAB — BASIC METABOLIC PANEL
BUN: 31 mg/dL — ABNORMAL HIGH (ref 6–23)
CO2: 32 mEq/L (ref 19–32)
Calcium: 9.3 mg/dL (ref 8.4–10.5)
Creatinine, Ser: 1.39 mg/dL — ABNORMAL HIGH (ref 0.50–1.10)
Glucose, Bld: 90 mg/dL (ref 70–99)

## 2012-09-27 MED ORDER — RESOURCE THICKENUP CLEAR PO POWD
ORAL | Status: DC | PRN
  Filled 2012-09-27: qty 125

## 2012-09-27 NOTE — Progress Notes (Signed)
[facility] called regarding pt dinner tray. Representative states he will reprint ticket.

## 2012-09-27 NOTE — Progress Notes (Signed)
Late entry to fix incorrectly entered G-code   06/15/2012 06/30/16  PT G-Codes **NOT FOR INPATIENT CLASS**  Functional Assessment Tool Used cliniacl judgement  Functional Limitation Mobility: Walking and moving around  Mobility: Walking and Moving Around Goal Status [phone]) CH  Mobility: Walking and Moving Around Discharge Status [phone]) CI  PT General Charges  $$ ACUTE PT VISIT 1 Procedure  PT Treatments  $Gait Training 8-22 mins  $Therapeutic Exercise 8-22 mins   Lavona Mound, [place]  [phone] 09/27/2012

## 2012-09-27 NOTE — Procedures (Signed)
Objective Swallowing Evaluation: Modified Barium Swallowing Study  Patient Details  Name: [name] MRN: [id number] Date of Birth: 08-Oct-1915  Today's Date: 09/27/2012 Time: 1335-141112:57-13:20  SLP Time Calculation (min): 36 min; 23 min  Past Medical History:  Past Medical History  Diagnosis Date  . Hypertension   . Hypothyroidism   . Cardiomegaly   . CHF (congestive heart failure)     Recent hospitalization for CHF & pneumonia -- EF is 40-45%  ( 03/16/10 by Dr. Peter [place] )    . History of rheumatic fever   . Coronary artery disease     EF of 40-45% with history of angioplasty  . Pulmonary hypertension     with PA pressure estimated at 53 mmHg  . Pulmonary edema   . Hypoxia   . Aortic insufficiency     moderate-severe  . Arthritis   . Anemia   . Complication of anesthesia     wakes up slowly  . History of blood transfusion 1980's; 2005    "8 pints w/1st knee OR; 3 pints 2nd knee OR" (06/07/2012)  . Heart murmur     "since age 61" (06/07/2012)  . Asthma   . 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 Shortness of breath     "can happen at any time" (06/07/2012)  . Dyspnea   . Pneumonia     "a few times in my life; used to have it q yr; last time was 01/21/2012" (06/07/2012)  . CKD (chronic kidney disease) 01/21/2012  . GERD (gastroesophageal reflux disease)   . Stroke 1980's    denies residual on 06/07/2012  . SIRS (systemic inflammatory response syndrome) 06/07/2012   Past Surgical History:  Past Surgical History  Procedure Laterality Date  . Thyroidectomy, partial  1998  . Hammer toe surgery Bilateral 1980's  . Joint replacement      bilat  . Tonsillectomy  1934  . Cataract extraction w/ intraocular lens implant Right 2005  . Replacement total knee Bilateral 1980's; 2006    "right; left" (06/07/2012)  . Knee arthroscopy Right 1980's  . Coronary angioplasty  1990's   HPI:  79 year old female patient with extensive past  medical history (HTN, hypothyroidism, chronic systolic CHF, CAD, pulmonary hypertension, aortic insufficiency, chronic kidney disease, throat cancer with surgery and radiation 2002) was admitted to the hospital on 09/24/12 with one-day history of productive cough, dyspnea and fever of 101.31F. Patient use intermittent home oxygen. Per daughter, O2 sats at home and 85% on room air. In the ED she was documented to be febrile to 100.6, CXR demonstrated LLL opacity. Patient also had mildly elevated WBC, put on rocephin and azithromycin and admitted for CAP.  Bedside swallow eval revealed +s/s aspiration; pt with chronic dysphagia.       Assessment / Plan / Recommendation Clinical Impression  Dysphagia Diagnosis: Moderate pharyngeal phase dysphagia; deficits noted per screen in cervical esophagus  Clinical impression: Pt presents with a moderate pharyngeal dysphagia with cervical esophageal impairment. Condition is likely chronic, with pt decompensating during acute medical events and swallow function deteriorating.  There is minimal mobility of the hyolaryngeal complex, likely due to fibrosis from radiation 2002.  Larynx does not elevate, epiglottis does not invert over larynx, and this leads to overt aspiration of thin liquids and penetration of nectars to the vocal folds (without consistent cough response.)  Material passes through UES and tends to accrue in cervical esophagus, with eventual passage into  distal esophagus and through LES.    Spoke at length with pt and her daughter, [name], after the study.  We discussed the nature of this dysphagia, its deterioration during acute medical events, the presence of aspiration and our inability to prevent it, only minimize it.  Pt/daughter verbalize understanding.  They would like to limit liquids to honey-thick temporarily, with the intent to re-assess at bedside at regular intervals and liberalize liquids when appropriate.      Treatment Recommendation   Therapy as outlined in treatment plan below    Diet Recommendation Dysphagia 3 (Mechanical Soft);Honey-thick liquid   Liquid Administration via: Cup Medication Administration: Crushed with puree Supervision: Patient able to self feed;Trained caregiver to feed patient Compensations: Slow rate;Small sips/bites;Clear throat intermittently Postural Changes and/or Swallow Maneuvers: Out of bed for meals;Upright 30-60 min after meal    Other  Recommendations Oral Care Recommendations: Oral care BID Other Recommendations: Order thickener from pharmacy   Follow Up Recommendations  Other (comment) (tba)    Frequency and Duration min 2x/week  2 weeks   Pertinent Vitals/Pain No pain    SLP Swallow Goals Patient will utilize recommended strategies during swallow to increase swallowing safety with: Supervision/safety   General Type of Study: Modified Barium Swallowing Study Previous Swallow Assessment: MBS 2012, recommend nectar thick liquids Diet Prior to this Study: Regular;Thin liquids Temperature Spikes Noted: No Respiratory Status: Supplemental O2 delivered via (comment) History of Recent Intubation: No Behavior/Cognition: Alert;Cooperative;Pleasant mood Oral Cavity - Dentition: Dentures, top;Dentures, bottom Oral Motor / Sensory Function: Within functional limits Self-Feeding Abilities: Able to feed self Patient Positioning: Upright in bed Baseline Vocal Quality: Hoarse;Other (comment) Volitional Cough: Congested Volitional Swallow: Able to elicit Anatomy: Other (Comment) (kyphosis; presence of large cricopharyngeus) Pharyngeal Secretions: Not observed secondary MBS    Reason for Referral     Oral Phase Oral Preparation/Oral Phase Oral Phase: WFL   Pharyngeal Phase  Pharyngeal Phase: Impaired  Pharyngeal - Honey Cup: Delayed swallow initiation;Reduced pharyngeal peristalsis;Reduced epiglottic inversion;Reduced anterior laryngeal mobility;Reduced laryngeal elevation;Reduced  airway/laryngeal closure;Pharyngeal residue - posterior pharnyx  Pharyngeal - Nectar Cup: Delayed swallow initiation;Reduced pharyngeal peristalsis;Reduced epiglottic inversion;Reduced anterior laryngeal mobility;Reduced laryngeal elevation;Reduced airway/laryngeal closure;Pharyngeal residue - posterior pharnyx;Penetration/Aspiration during swallow Penetration/Aspiration details (nectar cup): Material enters airway, CONTACTS cords and not ejected out  Pharyngeal - Thin Cup: Delayed swallow initiation;Reduced pharyngeal peristalsis;Reduced epiglottic inversion;Reduced anterior laryngeal mobility;Reduced laryngeal elevation;Reduced airway/laryngeal closure;Pharyngeal residue - posterior pharnyx;Penetration/Aspiration during swallow;Moderate aspiration Penetration/Aspiration details (thin cup): Material enters airway, passes BELOW cords without attempt by patient to eject out (silent aspiration)  Cervical Esophageal Phase    GO    Cervical Esophageal Phase Cervical Esophageal Phase: Impaired Cervical Esophageal Phase - Comment Cervical Esophageal Comment: Large cricopharyngeal bar; barium residue sits in cervical esophagus; minimal peristalsis observed        Madyn Ivins L. Samson Frederic, [place] CCC/SLP Pager [phone]  Blenda Mounts [place] 09/27/2012, 2:29 PM

## 2012-09-27 NOTE — Progress Notes (Addendum)
[facility] PROGRESS NOTE  [name] NWG:[id number] DOB: 1915-02-20 DOA: 09/24/2012 PCP: [name], MD  Assessment/Plan: 1. Community acquired pneumonia/? Aspiration PNA, with SIRS on admission: Left lower lobe pneumonia obtain a.m. chest x-ray. Continue IV Rocephin and azithromycin day 3. Start patient on a 3 day course of high-dose steroids in the a.m, and Mucinex DM, and pulmonary toilet.   2. Dysphagia; agrees to modified diet.  3. Hypoxia/Acute on chronic hypoxic respiratory failure: Secondary to pneumonia & ? decompensated CHF. Treat underlying cause and monitor. 4. Hypertension: Controlled. 5. Chronic kidney disease: Creatinine close to baseline. 6. Anemia: Probably at baseline. stable 7. Acute on Chronic systolic CHF: Diffuse crackles  8. Insomnia; continue Ttemazepam  at 30 mg at bedtime. Monitor    Code Status: Full Family Communication: Daughter present for discussion of plan of care Disposition Plan: Discharge in one to 2 days     Procedures:  Antibiotics:  IV Rocephin and azithromycin day 3/7   HPI/Subjective: 79 year old female patient with extensive past medical history (HTN, hypothyroidism, chronic systolic CHF, CAD, pulmonary hypertension, aortic insufficiency, chronic kidney disease, throat cancer) was admitted to the hospital on 09/24/12 with one-day history of productive cough, dyspnea and fever of 101.40F. Patient use intermittent home oxygen. Per daughter, O2 sats at home and 85% on room air. In the ED she was documented to be febrile to 100.6, CXR demonstrated LLL opacity. Patient also had mildly elevated WBC, put on rocephin and azithromycin and admitted for CAP. TODAY states feels better but not to baseline yet     Objective: Filed Vitals:   09/26/12 1654 09/26/12 2157 09/27/12 0556 09/27/12 0904  BP: 100/85 120/54 120/38 117/43  Pulse: 84 88 63 62  Temp: 98.7 F (37.1 C) 99.3 F (37.4 C) 98.2 F (36.8 C) 98.5 F (36.9 C)   TempSrc: Oral Oral Oral Oral  Resp: 18 18 18 16   Height:  5\' 2"  (1.575 m)    Weight:  57.295 kg (126 lb 5 oz)    SpO2: 100% 100% 99% 100%    Intake/Output Summary (Last 24 hours) at 09/27/12 1816 Last data filed at 09/27/12 1500  Gross per 24 hour  Intake    120 ml  Output   1650 ml  Net  -1530 ml   Filed Weights   09/25/12 0114 09/25/12 2057 09/26/12 2157  Weight: 59.194 kg (130 lb 8 oz) 59.194 kg (130 lb 8 oz) 57.295 kg (126 lb 5 oz)    Exam:   General: Alert ,NAD  Cardiovascular: Regular in rate, grade 4 systolic murmur (been there since a child)  Respiratory: Diffuse crackles all lung fields  Abdomen: Soft nontender nondistended plus bowel sounds   Data Reviewed: Basic Metabolic Panel:  Recent Labs Lab 09/24/12 2011 09/25/12 0530 09/26/12 0530 09/27/12 0616  NA 142 141 138 138  K 4.3 4.1 4.1 4.1  CL 106 106 102 100  CO2  --  27 26 32  GLUCOSE 89 76 79 90  BUN 29* 32* 37* 31*  CREATININE 1.50* 1.55* 1.53* 1.39*  CALCIUM  --  8.8 9.2 9.3   Liver Function Tests: No results found for this basename: AST, ALT, ALKPHOS, BILITOT, PROT, ALBUMIN,  in the last 168 hours No results found for this basename: LIPASE, AMYLASE,  in the last 168 hours No results found for this basename: AMMONIA,  in the last 168 hours CBC:  Recent Labs Lab 09/24/12 2011 09/24/12 2027 09/25/12 0530 09/26/12 0530  WBC  --  12.1* 13.4* 12.6*  NEUTROABS  --  11.0*  --   --   HGB 12.6 11.4* 9.8* 9.7*  HCT 37.0 35.8* 29.8* 30.5*  MCV  --  91.1 90.9 92.1  PLT  --  185 157 156   Cardiac Enzymes: No results found for this basename: CKTOTAL, CKMB, CKMBINDEX, TROPONINI,  in the last 168 hours BNP (last 3 results)  Recent Labs  04/02/12 1834 06/07/12 1000 06/07/12 1550  PROBNP 1761.0* 2076.0* 3203.0*   CBG: No results found for this basename: GLUCAP,  in the last 168 hours  Recent Results (from the past 240 hour(s))  CULTURE, BLOOD (ROUTINE X 2)     Status: None   Collection  Time    09/24/12  8:55 PM      Result Value Range Status   Specimen Description BLOOD RIGHT ARM   Final   Special Requests BOTTLES DRAWN AEROBIC ONLY 10CC   Final   Culture  Setup Time     Final   Value: 09/25/2012 02:39     Performed at [facility]   Culture     Final   Value:        BLOOD CULTURE RECEIVED NO GROWTH TO DATE CULTURE WILL BE HELD FOR 5 DAYS BEFORE ISSUING A FINAL NEGATIVE REPORT     Performed at [facility]   Report Status PENDING   Incomplete  CULTURE, BLOOD (ROUTINE X 2)     Status: None   Collection Time    09/24/12  9:03 PM      Result Value Range Status   Specimen Description BLOOD RIGHT ARM   Final   Special Requests BOTTLES DRAWN AEROBIC ONLY 10CC   Final   Culture  Setup Time     Final   Value: 09/25/2012 02:39     Performed at [facility]   Culture     Final   Value:        BLOOD CULTURE RECEIVED NO GROWTH TO DATE CULTURE WILL BE HELD FOR 5 DAYS BEFORE ISSUING A FINAL NEGATIVE REPORT     Performed at [facility]   Report Status PENDING   Incomplete  CULTURE, EXPECTORATED SPUTUM-ASSESSMENT     Status: None   Collection Time    09/25/12 10:43 AM      Result Value Range Status   Specimen Description SPUTUM   Final   Special Requests NONE   Final   Sputum evaluation     Final   Value: THIS SPECIMEN IS ACCEPTABLE. RESPIRATORY CULTURE REPORT TO FOLLOW.   Report Status 09/25/2012 FINAL   Final  CULTURE, RESPIRATORY (NON-EXPECTORATED)     Status: None   Collection Time    09/25/12 10:43 AM      Result Value Range Status   Specimen Description SPUTUM   Final   Special Requests NONE   Final   Gram Stain     Final   Value: ABUNDANT WBC PRESENT,BOTH PMN AND MONONUCLEAR     FEW SQUAMOUS EPITHELIAL CELLS PRESENT     RARE GRAM NEGATIVE RODS     Performed at [facility]     Performed at [facility]   Culture     Final   Value: MODERATE GRAM NEGATIVE RODS     Performed at [facility]   Report Status  PENDING   Incomplete  URINE CULTURE     Status: None   Collection Time    09/25/12  2:00  PM      Result Value Range Status   Specimen Description URINE, CATHETERIZED   Final   Special Requests NONE   Final   Culture  Setup Time     Final   Value: 09/25/2012 15:10     Performed at [facility] Count     Final   Value: NO GROWTH     Performed at [facility]   Culture     Final   Value: NO GROWTH     Performed at [facility]   Report Status 09/26/2012 FINAL   Final     Studies: Dg Swallowing Func-speech Pathology  09/27/2012   Carolan Shiver, CCC-SLP     09/27/2012  2:34 PM Objective Swallowing Evaluation: Modified Barium Swallowing Study   Patient Details  Name: [name] MRN: [id number] Date of Birth: 1915/06/03  Today's Date: 09/27/2012 Time: 1335-141112:57-13:20  SLP Time Calculation (min): 36 min; 23 min  Past Medical History:  Past Medical History  Diagnosis Date  . Hypertension   . Hypothyroidism   . Cardiomegaly   . CHF (congestive heart failure)     Recent hospitalization for CHF & pneumonia -- EF is 40-45%  (  03/16/10 by Dr. Peter [place] )    . History of rheumatic fever   . Coronary artery disease     EF of 40-45% with history of angioplasty  . Pulmonary hypertension     with PA pressure estimated at 53 mmHg  . Pulmonary edema   . Hypoxia   . Aortic insufficiency     moderate-severe  . Arthritis   . Anemia   . Complication of anesthesia     wakes up slowly  . History of blood transfusion 1980's; 2005    "8 pints w/1st knee OR; 3 pints 2nd knee OR" (06/07/2012)  . Heart murmur     "since age 46" (06/07/2012)  . Asthma   . 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 Shortness of breath     "can happen at any time" (06/07/2012)  . Dyspnea   . Pneumonia     "a few times in my life; used to have it q yr; last time was  01/21/2012" (06/07/2012)  . CKD (chronic kidney disease) 01/21/2012  . GERD (gastroesophageal reflux  disease)   . Stroke 1980's    denies residual on 06/07/2012  . SIRS (systemic inflammatory response syndrome) 06/07/2012   Past Surgical History:  Past Surgical History  Procedure Laterality Date  . Thyroidectomy, partial  1998  . Hammer toe surgery Bilateral 1980's  . Joint replacement      bilat  . Tonsillectomy  1934  . Cataract extraction w/ intraocular lens implant Right 2005  . Replacement total knee Bilateral 1980's; 2006    "right; left" (06/07/2012)  . Knee arthroscopy Right 1980's  . Coronary angioplasty  1990's   HPI:  79 year old female patient with extensive past medical history  (HTN, hypothyroidism, chronic systolic CHF, CAD, pulmonary  hypertension, aortic insufficiency, chronic kidney disease,  throat cancer with surgery and radiation 2002) was admitted to  the hospital on 09/24/12 with one-day history of productive cough,  dyspnea and fever of 101.5F. Patient use intermittent home  oxygen. Per daughter, O2 sats at home and 85% on room air. In the  ED she was documented to be febrile to 100.6, CXR demonstrated  LLL opacity. Patient also had mildly  elevated WBC, put on  rocephin and azithromycin and admitted for CAP.  Bedside swallow  eval revealed +s/s aspiration; pt with chronic dysphagia.       Assessment / Plan / Recommendation Clinical Impression  Dysphagia Diagnosis: Moderate pharyngeal phase dysphagia;  deficits noted per screen in cervical esophagus  Clinical impression: Pt presents with a moderate pharyngeal  dysphagia with cervical esophageal impairment. Condition is  likely chronic, with pt decompensating during acute medical  events and swallow function deteriorating.  There is minimal  mobility of the hyolaryngeal complex, likely due to fibrosis from  radiation 2002.  Larynx does not elevate, epiglottis does not  invert over larynx, and this leads to overt aspiration of thin  liquids and penetration of nectars to the vocal folds (without  consistent cough response.)  Material passes through  UES and  tends to accrue in cervical esophagus, with eventual passage into  distal esophagus and through LES.    Spoke at length with pt and her daughter, [name], after the  study.  We discussed the nature of this dysphagia, its  deterioration during acute medical events, the presence of  aspiration and our inability to prevent it, only minimize it.   Pt/daughter verbalize understanding.  They would like to limit  liquids to honey-thick temporarily, with the intent to re-assess  at bedside at regular intervals and liberalize liquids when  appropriate.      Treatment Recommendation  Therapy as outlined in treatment plan below    Diet Recommendation Dysphagia 3 (Mechanical Soft);Honey-thick  liquid   Liquid Administration via: Cup Medication Administration: Crushed with puree Supervision: Patient able to self feed;Trained caregiver to feed  patient Compensations: Slow rate;Small sips/bites;Clear throat  intermittently Postural Changes and/or Swallow Maneuvers: Out of bed for  meals;Upright 30-60 min after meal    Other  Recommendations Oral Care Recommendations: Oral care BID Other Recommendations: Order thickener from pharmacy   Follow Up Recommendations  Other (comment) (tba)    Frequency and Duration min 2x/week  2 weeks   Pertinent Vitals/Pain No pain    SLP Swallow Goals Patient will utilize recommended strategies during swallow to  increase swallowing safety with: Supervision/safety   General Type of Study: Modified Barium Swallowing Study Previous Swallow Assessment: MBS 2012, recommend nectar thick  liquids Diet Prior to this Study: Regular;Thin liquids Temperature Spikes Noted: No Respiratory Status: Supplemental O2 delivered via (comment) History of Recent Intubation: No Behavior/Cognition: Alert;Cooperative;Pleasant mood Oral Cavity - Dentition: Dentures, top;Dentures, bottom Oral Motor / Sensory Function: Within functional limits Self-Feeding Abilities: Able to feed self Patient Positioning: Upright in bed  Baseline Vocal Quality: Hoarse;Other (comment) Volitional Cough: Congested Volitional Swallow: Able to elicit Anatomy: Other (Comment) (kyphosis; presence of large  cricopharyngeus) Pharyngeal Secretions: Not observed secondary MBS    Reason for Referral     Oral Phase Oral Preparation/Oral Phase Oral Phase: WFL   Pharyngeal Phase  Pharyngeal Phase: Impaired  Pharyngeal - Honey Cup: Delayed swallow initiation;Reduced  pharyngeal peristalsis;Reduced epiglottic inversion;Reduced  anterior laryngeal mobility;Reduced laryngeal elevation;Reduced  airway/laryngeal closure;Pharyngeal residue - posterior pharnyx  Pharyngeal - Nectar Cup: Delayed swallow initiation;Reduced  pharyngeal peristalsis;Reduced epiglottic inversion;Reduced  anterior laryngeal mobility;Reduced laryngeal elevation;Reduced  airway/laryngeal closure;Pharyngeal residue - posterior  pharnyx;Penetration/Aspiration during swallow Penetration/Aspiration details (nectar cup): Material enters  airway, CONTACTS cords and not ejected out  Pharyngeal - Thin Cup: Delayed swallow initiation;Reduced  pharyngeal peristalsis;Reduced epiglottic inversion;Reduced  anterior laryngeal mobility;Reduced laryngeal elevation;Reduced  airway/laryngeal closure;Pharyngeal residue - posterior  pharnyx;Penetration/Aspiration during swallow;Moderate aspiration Penetration/Aspiration  details (thin cup): Material enters  airway, passes BELOW cords without attempt by patient to eject  out (silent aspiration)  Cervical Esophageal Phase    GO    Cervical Esophageal Phase Cervical Esophageal Phase: Impaired Cervical Esophageal Phase - Comment Cervical Esophageal Comment: Large cricopharyngeal bar; barium  residue sits in cervical esophagus; minimal peristalsis observed        Amanda L. Samson Frederic, [place] CCC/SLP Pager [phone]  Blenda Mounts Laurice 09/27/2012, 2:29 PM     Scheduled Meds: . amLODipine  5 mg Oral Daily  . artificial tears   Both Eyes Q8H  . aspirin EC  81 mg Oral Daily   . azithromycin  500 mg Oral Q24H  . carvedilol  12.5 mg Oral BID WC  . cefTRIAXone (ROCEPHIN)  IV  1 g Intravenous Q24H  . feeding supplement  1 Container Oral TID BM  . furosemide  10 mg Oral Daily  . heparin  5,000 Units Subcutaneous Q8H  . isosorbide dinitrate  10 mg Oral BID  . levothyroxine  75 mcg Oral QAC breakfast  . pantoprazole  40 mg Oral Daily  . potassium chloride  10 mEq Oral Daily  . temazepam  30 mg Oral QHS   Continuous Infusions:   Principal Problem:   CAP (community acquired pneumonia) Active Problems:   HYPERTENSION   CKD (chronic kidney disease)   SIRS (systemic inflammatory response syndrome)   Respiratory failure, acute and chronic   Acute on chronic systolic CHF (congestive heart failure)    Time spent: 35 minutes    Drema Dallas  [facility] Pager [phone] If 7PM-7AM, please contact night-coverage at [email or web address], password [facility] 09/27/2012, 6:16 PM  LOS: 3 days

## 2012-09-28 ENCOUNTER — Inpatient Hospital Stay (HOSPITAL_COMMUNITY): Payer: Medicare Other

## 2012-09-28 LAB — COMPREHENSIVE METABOLIC PANEL
Albumin: 2.5 g/dL — ABNORMAL LOW (ref 3.5–5.2)
Alkaline Phosphatase: 87 U/L (ref 39–117)
BUN: 32 mg/dL — ABNORMAL HIGH (ref 6–23)
Chloride: 97 mEq/L (ref 96–112)
Glucose, Bld: 85 mg/dL (ref 70–99)
Potassium: 4.2 mEq/L (ref 3.5–5.1)
Total Bilirubin: 0.2 mg/dL — ABNORMAL LOW (ref 0.3–1.2)

## 2012-09-28 LAB — CBC WITH DIFFERENTIAL/PLATELET
Basophils Relative: 0 % (ref 0–1)
Hemoglobin: 9.5 g/dL — ABNORMAL LOW (ref 12.0–15.0)
Lymphs Abs: 1.2 10*3/uL (ref 0.7–4.0)
Monocytes Relative: 9 % (ref 3–12)
Neutro Abs: 3.9 10*3/uL (ref 1.7–7.7)
Neutrophils Relative %: 66 % (ref 43–77)
RBC: 3.28 MIL/uL — ABNORMAL LOW (ref 3.87–5.11)
WBC: 5.8 10*3/uL (ref 4.0–10.5)

## 2012-09-28 LAB — CULTURE, RESPIRATORY W GRAM STAIN

## 2012-09-28 MED ORDER — PREDNISONE 50 MG PO TABS
50.0000 mg | ORAL_TABLET | Freq: Every day | ORAL | Status: DC
  Administered 2012-09-28 – 2012-09-29 (×2): 50 mg via ORAL
  Filled 2012-09-28 (×3): qty 1

## 2012-09-28 MED ORDER — DM-GUAIFENESIN ER 30-600 MG PO TB12
1.0000 | ORAL_TABLET | Freq: Two times a day (BID) | ORAL | Status: DC
  Administered 2012-09-28 – 2012-09-29 (×3): 1 via ORAL
  Filled 2012-09-28 (×4): qty 1

## 2012-09-28 NOTE — Progress Notes (Signed)
Speech Language Pathology Dysphagia Treatment Patient Details Name: [name] MRN: [id number] DOB: 1915-03-08 Today's Date: 09/28/2012 Time: 7564-3329 SLP Time Calculation (min): 11 min  Assessment / Plan / Recommendation Clinical Impression  F/u after yesterday's MBS.  Reviewed results, recs.  Daughter and pt state improved swallowing of honey-thick liquids with no coughing during bfast or lunch meals.  Pt's daughter is attentive and knowledgable re: precautions and monitoring her mother's toleration of diet.  Verbalizes precautions and plan.  Will return next date to determine potential for liberalization of diet.     Diet Recommendation  Continue with Current Diet: Dysphagia 3 (mechanical soft);Honey-thick liquid    SLP Plan Continue with current plan of care   Pertinent Vitals/Pain No pain   Swallowing Goals  SLP Swallowing Goals Patient will utilize recommended strategies during swallow to increase swallowing safety with: Supervision/safety Swallow Study Goal #2 - Progress: Progressing toward goal  General Temperature Spikes Noted: No Respiratory Status: Supplemental O2 delivered via (comment) Behavior/Cognition: Alert;Cooperative;Pleasant mood Oral Cavity - Dentition: Dentures, top;Dentures, bottom Patient Positioning: Partially reclined  Oral Cavity - Oral Hygiene Does patient have any of the following "at risk" factors?: Oxygen therapy - cannula, mask, simple oxygen devices Patient is AT RISK - Oral Care Protocol followed (see row info): Yes   Dysphagia Treatment Treatment focused on: Patient/family/caregiver education Family/Caregiver Educated: daughter Patient observed directly with PO's: No Reason PO's not observed: Other (comment) (education session)   GO     Blenda Mounts Laurice 09/28/2012, 1:37 PM

## 2012-09-28 NOTE — Progress Notes (Signed)
Physical Therapy Treatment Patient Details Name: [name] MRN: [id number] DOB: 07/05/1915 Today's Date: 09/28/2012 Time: 1151-1209 PT Time Calculation (min): 18 min  PT Assessment / Plan / Recommendation  History of Present Illness 79 year old female patient with extensive past medical history (HTN, hypothyroidism, chronic systolic CHF, CAD, pulmonary hypertension, aortic insufficiency, chronic kidney disease, throat cancer) was admitted to the hospital on 09/24/12 with one-day history of productive cough, dyspnea and fever of 101.75F. Patient use intermittent home oxygen. Per daughter, O2 sats at home and 85% on room air. In the ED she was documented to be febrile to 100.6, CXR demonstrated LLL opacity. Patient also had mildly elevated WBC, put on rocephin and azithromycin and admitted for CAP.   PT Comments   Pt very pleasant and willing to work with therapy.  Pt continues to make progress and will have family support at home.   Follow Up Recommendations  No PT follow up     Equipment Recommendations  None recommended by PT    Frequency Min 3X/week   Progress towards PT Goals Progress towards PT goals: Progressing toward goals  Plan Current plan remains appropriate    Precautions / Restrictions Precautions Precautions: Fall Precaution Comments: chronic O2 Restrictions Weight Bearing Restrictions: No   Pertinent Vitals/Pain No c/o pain    Mobility  Bed Mobility Bed Mobility: Sit to Supine Supine to Sit: 4: Min guard;With rails;HOB elevated Sitting - Scoot to Edge of Bed: 4: Min guard Sit to Supine: 4: Min guard Details for Bed Mobility Assistance: Minguard for safety with cues for technique.  Family present so assist pt with proper positions. Transfers Transfers: Stand to Sit Sit to Stand: 4: Min guard;With upper extremity assist Stand to Sit: 4: Min guard;With upper extremity assist Details for Transfer Assistance: Vcs for safe  technique Ambulation/Gait Ambulation/Gait Assistance: 4: Min guard Ambulation Distance (Feet): 200 Feet Assistive device: Rolling walker Ambulation/Gait Assistance Details: Minguard for safety with cues for RW Gait Pattern: Step-through pattern;Trunk flexed;Decreased stride length Gait velocity: decreased Stairs: No     PT Diagnosis:    PT Problem List:   PT Treatment Interventions:     PT Goals (current goals can now be found in the care plan section) Acute Rehab PT Goals Patient Stated Goal: home with family PT Goal Formulation: With patient/family Time For Goal Achievement: 10/03/12 Potential to Achieve Goals: Good  Visit Information  Last PT Received On: 09/28/12 Assistance Needed: +1 History of Present Illness: 80 year old female patient with extensive past medical history (HTN, hypothyroidism, chronic systolic CHF, CAD, pulmonary hypertension, aortic insufficiency, chronic kidney disease, throat cancer) was admitted to the hospital on 09/24/12 with one-day history of productive cough, dyspnea and fever of 101.75F. Patient use intermittent home oxygen. Per daughter, O2 sats at home and 85% on room air. In the ED she was documented to be febrile to 100.6, CXR demonstrated LLL opacity. Patient also had mildly elevated WBC, put on rocephin and azithromycin and admitted for CAP.    Subjective Data  Subjective: "I like to say poems and sing while I walk." Patient Stated Goal: home with family   Cognition  Cognition Arousal/Alertness: Awake/alert Behavior During Therapy: WFL for tasks assessed/performed Overall Cognitive Status: Within Functional Limits for tasks assessed    Balance  Balance Balance Assessed: Yes Static Standing Balance Static Standing - Balance Support: No upper extremity supported Static Standing - Level of Assistance: 5: Stand by assistance  End of Session PT - End of Session Equipment Utilized During Treatment:  Gait belt;Oxygen Activity Tolerance:  Patient limited by fatigue Patient left: in bed;with call bell/phone within reach;with family/visitor present   GP     Emberlin Verner 09/28/2012, 1:49 PM  Jake Shark, PT DPT [phone]

## 2012-09-28 NOTE — Progress Notes (Addendum)
[facility] PROGRESS NOTE  DAKODA BASSETTE XBJ:[id number] DOB: 03-30-1915 DOA: 09/24/2012 PCP: [name], MD  Assessment/Plan: 1. Community acquired pneumonia/? Aspiration PNA, with SIRS on admission: Left lower lobe pneumonia obtain a.m. chest x-ray. Continue IV Rocephin and azithromycin day 4/7. Continue patient on a 3 day course of high-dose steroids in the a.m, and Mucinex DM, and pulmonary toilet. Counseled patient and daughter that there appeared to be 2 nodules in her lung which are now visible secondary to clearing of the pneumonia requested to know how hard they would like for me to pursue the workup. We'll talk with family members and give answer in the a.m. 2. Lung nodules; see #1 3. Dysphagia; continue modified diet.  4. Hypoxia/Acute on chronic hypoxic respiratory failure: Resolved patient SpO2 = 95% on room air . 5. Hypertension: Slightly above AHA guidelines but given patient's age and illness would not try to lower this time . 6. Chronic kidney disease: Creatinine close to baseline. 7. Anemia: Probably at baseline. stable 8. Acute on Chronic systolic CHF: Diffuse crackles  9. Insomnia; continue Ttemazepam  at 30 mg at bedtime. Monitor    Code Status: Full Family Communication: Daughter present for discussion of plan of care Disposition Plan: Discharge in one to 2 days     Procedures: CXR 09/28/2012 1. Improving aeration with decreasing airspace disease in the  right base and throughout the left lung.  2. Persistent nodular consolidation versus pulmonary nodule in the  left upper lung.  3. Persistent elevation of the left hemidiaphragm with associated  left basilar atelectasis.  4. Pulmonary nodule versus prominent vascular shadow in the right  perihilar region is similar compared to prior imaging.  Recommend continued imaging followup to resolution. If clinically  warranted, CT of the chest could further evaluate.    Antibiotics:  IV Rocephin and  azithromycin day 3/7   HPI/Subjective: 79 year old female patient with extensive past medical history (HTN, hypothyroidism, chronic systolic CHF, CAD, pulmonary hypertension, aortic insufficiency, chronic kidney disease, throat cancer) was admitted to the hospital on 09/24/12 with one-day history of productive cough, dyspnea and fever of 101.65F. Patient use intermittent home oxygen. Per daughter, O2 sats at home and 85% on room air. In the ED she was documented to be febrile to 100.6, CXR demonstrated LLL opacity. Patient also had mildly elevated WBC, put on rocephin and azithromycin and admitted for CAP. TODAY states feels better as PO2 equal to 95% on room air which is the first time patient has been off of nasal cannula O2 since I have been treating her     Objective: Filed Vitals:   09/28/12 1732 09/28/12 2017 09/28/12 2022 09/29/12 0544  BP: 148/84  129/46 151/81  Pulse: 77 85 87 87  Temp: 97.9 F (36.6 C)  97.9 F (36.6 C) 98.1 F (36.7 C)  TempSrc: Oral  Oral Oral  Resp: 20 18 18 18   Height:      Weight:   59.1 kg (130 lb 4.7 oz)   SpO2: 98% 99% 100% 100%    Intake/Output Summary (Last 24 hours) at 09/29/12 0648 Last data filed at 09/28/12 2121  Gross per 24 hour  Intake    510 ml  Output    700 ml  Net   -190 ml   Filed Weights   09/26/12 2157 09/27/12 2128 09/28/12 2022  Weight: 57.295 kg (126 lb 5 oz) 57.295 kg (126 lb 5 oz) 59.1 kg (130 lb 4.7 oz)    Exam:   General:  Alert ,NAD  Cardiovascular: Regular in rate, grade 4 systolic murmur (been there since a child)  Respiratory: Diffuse crackles all lung fields, but definite clearing in the apex bilaterally  Abdomen: Soft nontender nondistended plus bowel sounds   Data Reviewed: Basic Metabolic Panel:  Recent Labs Lab 09/24/12 2011 09/25/12 0530 09/26/12 0530 09/27/12 0616 09/28/12 0445  NA 142 141 138 138 136  K 4.3 4.1 4.1 4.1 4.2  CL 106 106 102 100 97  CO2  --  27 26 32 31  GLUCOSE 89 76 79 90  85  BUN 29* 32* 37* 31* 32*  CREATININE 1.50* 1.55* 1.53* 1.39* 1.29*  CALCIUM  --  8.8 9.2 9.3 9.2   Liver Function Tests:  Recent Labs Lab 09/28/12 0445  AST 14  ALT 7  ALKPHOS 87  BILITOT 0.2*  PROT 6.6  ALBUMIN 2.5*   No results found for this basename: LIPASE, AMYLASE,  in the last 168 hours No results found for this basename: AMMONIA,  in the last 168 hours CBC:  Recent Labs Lab 09/24/12 2011 09/24/12 2027 09/25/12 0530 09/26/12 0530 09/28/12 0445  WBC  --  12.1* 13.4* 12.6* 5.8  NEUTROABS  --  11.0*  --   --  3.9  HGB 12.6 11.4* 9.8* 9.7* 9.5*  HCT 37.0 35.8* 29.8* 30.5* 29.9*  MCV  --  91.1 90.9 92.1 91.2  PLT  --  185 157 156 170   Cardiac Enzymes: No results found for this basename: CKTOTAL, CKMB, CKMBINDEX, TROPONINI,  in the last 168 hours BNP (last 3 results)  Recent Labs  04/02/12 1834 06/07/12 1000 06/07/12 1550  PROBNP 1761.0* 2076.0* 3203.0*   CBG: No results found for this basename: GLUCAP,  in the last 168 hours  Recent Results (from the past 240 hour(s))  CULTURE, BLOOD (ROUTINE X 2)     Status: None   Collection Time    09/24/12  8:55 PM      Result Value Range Status   Specimen Description BLOOD RIGHT ARM   Final   Special Requests BOTTLES DRAWN AEROBIC ONLY 10CC   Final   Culture  Setup Time     Final   Value: 09/25/2012 02:39     Performed at [facility]   Culture     Final   Value:        BLOOD CULTURE RECEIVED NO GROWTH TO DATE CULTURE WILL BE HELD FOR 5 DAYS BEFORE ISSUING A FINAL NEGATIVE REPORT     Performed at [facility]   Report Status PENDING   Incomplete  CULTURE, BLOOD (ROUTINE X 2)     Status: None   Collection Time    09/24/12  9:03 PM      Result Value Range Status   Specimen Description BLOOD RIGHT ARM   Final   Special Requests BOTTLES DRAWN AEROBIC ONLY 10CC   Final   Culture  Setup Time     Final   Value: 09/25/2012 02:39     Performed at [facility]   Culture     Final    Value:        BLOOD CULTURE RECEIVED NO GROWTH TO DATE CULTURE WILL BE HELD FOR 5 DAYS BEFORE ISSUING A FINAL NEGATIVE REPORT     Performed at [facility]   Report Status PENDING   Incomplete  CULTURE, EXPECTORATED SPUTUM-ASSESSMENT     Status: None   Collection Time    09/25/12 10:43 AM  Result Value Range Status   Specimen Description SPUTUM   Final   Special Requests NONE   Final   Sputum evaluation     Final   Value: THIS SPECIMEN IS ACCEPTABLE. RESPIRATORY CULTURE REPORT TO FOLLOW.   Report Status 09/25/2012 FINAL   Final  CULTURE, RESPIRATORY (NON-EXPECTORATED)     Status: None   Collection Time    09/25/12 10:43 AM      Result Value Range Status   Specimen Description SPUTUM   Final   Special Requests NONE   Final   Gram Stain     Final   Value: ABUNDANT WBC PRESENT,BOTH PMN AND MONONUCLEAR     FEW SQUAMOUS EPITHELIAL CELLS PRESENT     RARE GRAM NEGATIVE RODS     Performed at [facility]     Performed at [facility]   Culture     Final   Value: MODERATE KLEBSIELLA PNEUMONIAE     Performed at [facility]   Report Status 09/28/2012 FINAL   Final   Organism ID, Bacteria KLEBSIELLA PNEUMONIAE   Final  URINE CULTURE     Status: None   Collection Time    09/25/12  2:00 PM      Result Value Range Status   Specimen Description URINE, CATHETERIZED   Final   Special Requests NONE   Final   Culture  Setup Time     Final   Value: 09/25/2012 15:10     Performed at [facility] Count     Final   Value: NO GROWTH     Performed at [facility]   Culture     Final   Value: NO GROWTH     Performed at [facility]   Report Status 09/26/2012 FINAL   Final     Studies: Dg Chest 2 View  09/28/2012   *RADIOLOGY REPORT*  Clinical Data: Pneumonia, evaluate for interval change  CHEST - 2 VIEW  Comparison: Prior chest x-ray 09/24/2012  Findings: Improving aeration with decreased interstitial and airspace disease in  the right base and throughout the left lung. There is persistent elevation of the left hemidiaphragm with associated left basilar atelectasis.  Persistent residual nodular opacity in the left upper lung with adjacent architectural distortion.  Probable pulmonary nodule in the right mid lung as well.  Stable cardiomegaly.  Aortic atherosclerosis.  Degenerative changes in the bilateral glenohumeral joints.  No acute osseous abnormality.  IMPRESSION:  1.  Improving aeration with decreasing airspace disease in the right base and throughout the left lung. 2.  Persistent nodular consolidation versus pulmonary nodule in the left upper lung. 3.  Persistent elevation of the left hemidiaphragm with associated left basilar atelectasis. 4.  Pulmonary nodule versus prominent vascular shadow in the right perihilar region is similar compared to prior imaging.  Recommend continued imaging followup to resolution.  If clinically warranted, CT of the chest could further evaluate.   Original Report Authenticated By: Malachy Moan, M.D.   Dg Swallowing Func-speech Pathology  09/27/2012   Carolan Shiver, CCC-SLP     09/27/2012  2:34 PM Objective Swallowing Evaluation: Modified Barium Swallowing Study   Patient Details  Name: LEIANN SPORER MRN: [id number] Date of Birth: [birth year]  Today's Date: 09/27/2012 Time: 1335-141112:57-13:20  SLP Time Calculation (min): 36 min; 23 min  Past Medical History:  Past Medical History  Diagnosis Date  . Hypertension   . Hypothyroidism   . Cardiomegaly   .  CHF (congestive heart failure)     Recent hospitalization for CHF & pneumonia -- EF is 40-45%  (  03/16/10 by Dr. Peter [place] )    . History of rheumatic fever   . Coronary artery disease     EF of 40-45% with history of angioplasty  . Pulmonary hypertension     with PA pressure estimated at 53 mmHg  . Pulmonary edema   . Hypoxia   . Aortic insufficiency     moderate-severe  . Arthritis   . Anemia   . Complication of anesthesia     wakes up  slowly  . History of blood transfusion 1980's; 2005    "8 pints w/1st knee OR; 3 pints 2nd knee OR" (06/07/2012)  . Heart murmur     "since age 86" (06/07/2012)  . Asthma   . 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 Shortness of breath     "can happen at any time" (06/07/2012)  . Dyspnea   . Pneumonia     "a few times in my life; used to have it q yr; last time was  01/21/2012" (06/07/2012)  . CKD (chronic kidney disease) 01/21/2012  . GERD (gastroesophageal reflux disease)   . Stroke 1980's    denies residual on 06/07/2012  . SIRS (systemic inflammatory response syndrome) 06/07/2012   Past Surgical History:  Past Surgical History  Procedure Laterality Date  . Thyroidectomy, partial  1998  . Hammer toe surgery Bilateral 1980's  . Joint replacement      bilat  . Tonsillectomy  1934  . Cataract extraction w/ intraocular lens implant Right 2005  . Replacement total knee Bilateral 1980's; 2006    "right; left" (06/07/2012)  . Knee arthroscopy Right 1980's  . Coronary angioplasty  1990's   HPI:  79 year old female patient with extensive past medical history  (HTN, hypothyroidism, chronic systolic CHF, CAD, pulmonary  hypertension, aortic insufficiency, chronic kidney disease,  throat cancer with surgery and radiation 2002) was admitted to  the hospital on 09/24/12 with one-day history of productive cough,  dyspnea and fever of 101.47F. Patient use intermittent home  oxygen. Per daughter, O2 sats at home and 85% on room air. In the  ED she was documented to be febrile to 100.6, CXR demonstrated  LLL opacity. Patient also had mildly elevated WBC, put on  rocephin and azithromycin and admitted for CAP.  Bedside swallow  eval revealed +s/s aspiration; pt with chronic dysphagia.       Assessment / Plan / Recommendation Clinical Impression  Dysphagia Diagnosis: Moderate pharyngeal phase dysphagia;  deficits noted per screen in cervical esophagus  Clinical impression: Pt presents with a moderate  pharyngeal  dysphagia with cervical esophageal impairment. Condition is  likely chronic, with pt decompensating during acute medical  events and swallow function deteriorating.  There is minimal  mobility of the hyolaryngeal complex, likely due to fibrosis from  radiation 2002.  Larynx does not elevate, epiglottis does not  invert over larynx, and this leads to overt aspiration of thin  liquids and penetration of nectars to the vocal folds (without  consistent cough response.)  Material passes through UES and  tends to accrue in cervical esophagus, with eventual passage into  distal esophagus and through LES.    Spoke at length with pt and her daughter, [name], after the  study.  We discussed the nature of this dysphagia, its  deterioration during acute medical events, the  presence of  aspiration and our inability to prevent it, only minimize it.   Pt/daughter verbalize understanding.  They would like to limit  liquids to honey-thick temporarily, with the intent to re-assess  at bedside at regular intervals and liberalize liquids when  appropriate.      Treatment Recommendation  Therapy as outlined in treatment plan below    Diet Recommendation Dysphagia 3 (Mechanical Soft);Honey-thick  liquid   Liquid Administration via: Cup Medication Administration: Crushed with puree Supervision: Patient able to self feed;Trained caregiver to feed  patient Compensations: Slow rate;Small sips/bites;Clear throat  intermittently Postural Changes and/or Swallow Maneuvers: Out of bed for  meals;Upright 30-60 min after meal    Other  Recommendations Oral Care Recommendations: Oral care BID Other Recommendations: Order thickener from pharmacy   Follow Up Recommendations  Other (comment) (tba)    Frequency and Duration min 2x/week  2 weeks   Pertinent Vitals/Pain No pain    SLP Swallow Goals Patient will utilize recommended strategies during swallow to  increase swallowing safety with: Supervision/safety   General Type of Study:  Modified Barium Swallowing Study Previous Swallow Assessment: MBS 2012, recommend nectar thick  liquids Diet Prior to this Study: Regular;Thin liquids Temperature Spikes Noted: No Respiratory Status: Supplemental O2 delivered via (comment) History of Recent Intubation: No Behavior/Cognition: Alert;Cooperative;Pleasant mood Oral Cavity - Dentition: Dentures, top;Dentures, bottom Oral Motor / Sensory Function: Within functional limits Self-Feeding Abilities: Able to feed self Patient Positioning: Upright in bed Baseline Vocal Quality: Hoarse;Other (comment) Volitional Cough: Congested Volitional Swallow: Able to elicit Anatomy: Other (Comment) (kyphosis; presence of large  cricopharyngeus) Pharyngeal Secretions: Not observed secondary MBS    Reason for Referral     Oral Phase Oral Preparation/Oral Phase Oral Phase: WFL   Pharyngeal Phase  Pharyngeal Phase: Impaired  Pharyngeal - Honey Cup: Delayed swallow initiation;Reduced  pharyngeal peristalsis;Reduced epiglottic inversion;Reduced  anterior laryngeal mobility;Reduced laryngeal elevation;Reduced  airway/laryngeal closure;Pharyngeal residue - posterior pharnyx  Pharyngeal - Nectar Cup: Delayed swallow initiation;Reduced  pharyngeal peristalsis;Reduced epiglottic inversion;Reduced  anterior laryngeal mobility;Reduced laryngeal elevation;Reduced  airway/laryngeal closure;Pharyngeal residue - posterior  pharnyx;Penetration/Aspiration during swallow Penetration/Aspiration details (nectar cup): Material enters  airway, CONTACTS cords and not ejected out  Pharyngeal - Thin Cup: Delayed swallow initiation;Reduced  pharyngeal peristalsis;Reduced epiglottic inversion;Reduced  anterior laryngeal mobility;Reduced laryngeal elevation;Reduced  airway/laryngeal closure;Pharyngeal residue - posterior  pharnyx;Penetration/Aspiration during swallow;Moderate aspiration Penetration/Aspiration details (thin cup): Material enters  airway, passes BELOW cords without attempt by patient to  eject  out (silent aspiration)  Cervical Esophageal Phase    GO    Cervical Esophageal Phase Cervical Esophageal Phase: Impaired Cervical Esophageal Phase - Comment Cervical Esophageal Comment: Large cricopharyngeal bar; barium  residue sits in cervical esophagus; minimal peristalsis observed        Amanda L. Samson Frederic, [place] CCC/SLP Pager [phone]  Blenda Mounts Laurice 09/27/2012, 2:29 PM     Scheduled Meds: . amLODipine  5 mg Oral Daily  . artificial tears   Both Eyes Q8H  . aspirin EC  81 mg Oral Daily  . azithromycin  500 mg Oral Q24H  . carvedilol  12.5 mg Oral BID WC  . cefTRIAXone (ROCEPHIN)  IV  1 g Intravenous Q24H  . dextromethorphan-guaiFENesin  1 tablet Oral BID  . feeding supplement  1 Container Oral TID BM  . furosemide  10 mg Oral Daily  . heparin  5,000 Units Subcutaneous Q8H  . isosorbide dinitrate  10 mg Oral BID  . levothyroxine  75 mcg Oral QAC breakfast  .  pantoprazole  40 mg Oral Daily  . potassium chloride  10 mEq Oral Daily  . predniSONE  50 mg Oral Q breakfast  . temazepam  30 mg Oral QHS   Continuous Infusions:   Principal Problem:   CAP (community acquired pneumonia) Active Problems:   HYPERTENSION   CKD (chronic kidney disease)   SIRS (systemic inflammatory response syndrome)   Respiratory failure, acute and chronic   Acute on chronic systolic CHF (congestive heart failure)    Time spent: 35 minutes    Drema Dallas  [facility] Pager [phone] If 7PM-7AM, please contact night-coverage at [email or web address], password [facility] 09/29/2012, 6:48 AM  LOS: 5 days

## 2012-09-28 NOTE — Evaluation (Signed)
Occupational Therapy Evaluation Patient Details Name: [name] MRN: [id number] DOB: 1915-10-15 Today's Date: 09/28/2012 Time: 0865-7846 OT Time Calculation (min): 29 min  OT Assessment / Plan / Recommendation History of present illness 79 year old female patient with extensive past medical history (HTN, hypothyroidism, chronic systolic CHF, CAD, pulmonary hypertension, aortic insufficiency, chronic kidney disease, throat cancer) was admitted to the hospital on 09/24/12 with one-day history of productive cough, dyspnea and fever of 101.76F. Patient use intermittent home oxygen. Per daughter, O2 sats at home and 85% on room air. In the ED she was documented to be febrile to 100.6, CXR demonstrated LLL opacity. Patient also had mildly elevated WBC, put on rocephin and azithromycin and admitted for CAP.   Clinical Impression   Pt's family is able to care for pt at home and encourages pt's participation in ADL as she is able.  All equipment needs are met.  No further OT needs.    OT Assessment  Patient does not need any further OT services    Follow Up Recommendations  No OT follow up;Supervision/Assistance - 24 hour    Barriers to Discharge      Equipment Recommendations  None recommended by OT    Recommendations for Other Services    Frequency       Precautions / Restrictions Precautions Precautions: Fall Restrictions Weight Bearing Restrictions: No   Pertinent Vitals/Pain Denies pain    ADL  Eating/Feeding: Independent Where Assessed - Eating/Feeding: Bed level Grooming: Wash/dry hands;Min guard Where Assessed - Grooming: Unsupported standing Upper Body Bathing: Minimal assistance Where Assessed - Upper Body Bathing: Unsupported sitting Lower Body Bathing: Minimal assistance Where Assessed - Lower Body Bathing: Unsupported sitting;Supported sit to stand Upper Body Dressing: Minimal assistance Where Assessed - Upper Body Dressing: Supported standing Lower Body Dressing:  Minimal assistance Where Assessed - Lower Body Dressing: Unsupported sitting;Supported sit to stand Toilet Transfer: Min Pension scheme manager Method: Surveyor, minerals: Bedside commode Toileting - Architect and Hygiene: Min guard Where Assessed - Engineer, mining and Hygiene: Standing Equipment Used: Rolling walker Transfers/Ambulation Related to ADLs: min guard assist with RW ADL Comments: Pt has all necessary DME and assist at home.  Family encourages independence in ADL.    OT Diagnosis:    OT Problem List:   OT Treatment Interventions:     OT Goals(Current goals can be found in the care plan section) Acute Rehab OT Goals Patient Stated Goal: home with family  Visit Information  Last OT Received On: 09/28/12 Assistance Needed: +1 History of Present Illness: 79 year old female patient with extensive past medical history (HTN, hypothyroidism, chronic systolic CHF, CAD, pulmonary hypertension, aortic insufficiency, chronic kidney disease, throat cancer) was admitted to the hospital on 09/24/12 with one-day history of productive cough, dyspnea and fever of 101.76F. Patient use intermittent home oxygen. Per daughter, O2 sats at home and 85% on room air. In the ED she was documented to be febrile to 100.6, CXR demonstrated LLL opacity. Patient also had mildly elevated WBC, put on rocephin and azithromycin and admitted for CAP.       Prior Functioning     Home Living Family/patient expects to be discharged to:: Private residence Living Arrangements: Alone;Other (Comment) Available Help at Discharge: Available 24 hours/day;Family Type of Home: House Home Access: Ramped entrance Home Layout: One level Home Equipment: Walker - 2 wheels;Wheelchair - manual;Shower seat;Bedside commode Prior Function Level of Independence: Needs assistance ADL's / Homemaking Assistance Needed: assisted for meals and housekeeping Comments: Daughter  reports  pt attempts to maintain as much independence as possible, pt reports able to bath and dress herself as well as ambulate around home with RW Communication Communication: No difficulties Dominant Hand: Right         Vision/Perception Vision - History Baseline Vision: Wears glasses all the time Patient Visual Report: No change from baseline   Cognition  Cognition Arousal/Alertness: Awake/alert Behavior During Therapy: WFL for tasks assessed/performed Overall Cognitive Status: Within Functional Limits for tasks assessed    Extremity/Trunk Assessment Upper Extremity Assessment Upper Extremity Assessment: Overall WFL for tasks assessed Lower Extremity Assessment Lower Extremity Assessment: Defer to PT evaluation Cervical / Trunk Assessment Cervical / Trunk Assessment: Kyphotic     Mobility Bed Mobility Bed Mobility: Supine to Sit;Sitting - Scoot to Edge of Bed Supine to Sit: 4: Min guard;With rails;HOB elevated Sitting - Scoot to Edge of Bed: 4: Min guard Transfers Transfers: Sit to Stand;Stand to Sit Sit to Stand: 4: Min guard;With upper extremity assist Stand to Sit: 4: Min guard;With upper extremity assist     Exercise     Balance Balance Balance Assessed: Yes Static Standing Balance Static Standing - Balance Support: No upper extremity supported Static Standing - Level of Assistance: 5: Stand by assistance   End of Session OT - End of Session Activity Tolerance: Patient tolerated treatment well Patient left:  (with PT ambulating in hall)  GO     Evern Bio 09/28/2012, 12:04 PM [phone]

## 2012-09-28 NOTE — Progress Notes (Signed)
Patient instructed on use of flutter valve. Patient coherent and using flutter effectively.

## 2012-09-29 DIAGNOSIS — R1310 Dysphagia, unspecified: Secondary | ICD-10-CM

## 2012-09-29 DIAGNOSIS — R918 Other nonspecific abnormal finding of lung field: Secondary | ICD-10-CM

## 2012-09-29 LAB — COMPREHENSIVE METABOLIC PANEL
ALT: 9 U/L (ref 0–35)
AST: 18 U/L (ref 0–37)
Albumin: 2.8 g/dL — ABNORMAL LOW (ref 3.5–5.2)
Calcium: 10.1 mg/dL (ref 8.4–10.5)
Sodium: 140 mEq/L (ref 135–145)
Total Protein: 7.3 g/dL (ref 6.0–8.3)

## 2012-09-29 LAB — CBC WITH DIFFERENTIAL/PLATELET
Basophils Absolute: 0 10*3/uL (ref 0.0–0.1)
Basophils Relative: 0 % (ref 0–1)
Eosinophils Absolute: 0 10*3/uL (ref 0.0–0.7)
Eosinophils Relative: 0 % (ref 0–5)
Lymphocytes Relative: 10 % — ABNORMAL LOW (ref 12–46)
MCH: 28.6 pg (ref 26.0–34.0)
MCHC: 31.6 g/dL (ref 30.0–36.0)
MCV: 90.6 fL (ref 78.0–100.0)
Platelets: 177 10*3/uL (ref 150–400)
RDW: 14.6 % (ref 11.5–15.5)
WBC: 8.1 10*3/uL (ref 4.0–10.5)

## 2012-09-29 MED ORDER — LEVOFLOXACIN 500 MG PO TABS: 500.0000 mg | ORAL_TABLET | Freq: Every day | ORAL | Status: AC

## 2012-09-29 MED ORDER — PREDNISONE 50 MG PO TABS: ORAL_TABLET | ORAL | Status: AC

## 2012-09-29 MED ORDER — LEVOFLOXACIN 500 MG PO TABS
500.0000 mg | ORAL_TABLET | Freq: Every day | ORAL | Status: DC
  Administered 2012-09-29: 500 mg via ORAL
  Filled 2012-09-29 (×3): qty 1

## 2012-09-29 MED ORDER — PANTOPRAZOLE SODIUM 40 MG PO TBEC
DELAYED_RELEASE_TABLET | ORAL | Status: AC
  Filled 2012-09-29: qty 1

## 2012-09-29 NOTE — Discharge Summary (Signed)
Physician Discharge Summary  [name] JXB:[id number] DOB: [birth year] DOA: 09/24/2012  PCP: [name], MD  Admit date: 09/24/2012 Discharge date: 09/29/2012  Time spent: 35 minutes  Recommendations for Outpatient Follow-up:  1. Community acquired pneumonia/? Aspiration PNA, with SIRS on admission: Left lower lobe pneumonia obtain a.m. chest x-ray. Continue IV Rocephin and azithromycin day 4/7. We'll convert over to levofloxacin for the patient's last 3 days of antibiotics in order to allow discharge.  Continue patient on a 3 day course of high-dose steroids in the a.m, and Mucinex DM, and pulmonary toilet. Counseled patient and daughter that there appeared to be 2 nodules in her lung which are now visible secondary to clearing of the pneumonia. Overnight family decided that they would at a minimum request chest CT to further evaluate nodules. We'll also refer patient to pulmonologist at [facility].   2. Lung nodules; see #1 3. Dysphagia; continue modified diet.  4. Hypoxia/Acute on chronic hypoxic respiratory failure: Resolved patient SpO2 = 95% on room air . 5. Hypertension: Slightly above AHA guidelines but given patient's age and illness would not try to lower this time . 6. Chronic kidney disease: Creatinine close to baseline. 7. Anemia: Probably at baseline. stable 8. Acute on Chronic systolic CHF: Diffuse crackles  Insomnia; continue Ttemazepam at 30 mg at bedtime. Monitor   Procedure CXR 09/28/2012 1. Improving aeration with decreasing airspace disease in the  right base and throughout the left lung.  2. Persistent nodular consolidation versus pulmonary nodule in the  left upper lung.  3. Persistent elevation of the left hemidiaphragm with associated  left basilar atelectasis.  4. Pulmonary nodule versus prominent vascular shadow in the right  perihilar region is similar compared to prior imaging.    Discharge Diagnoses:  Principal Problem:   CAP (community acquired  pneumonia) Active Problems:   HYPERTENSION   CKD (chronic kidney disease)   SIRS (systemic inflammatory response syndrome)   Respiratory failure, acute and chronic   Acute on chronic systolic CHF (congestive heart failure)   Lung nodule, multiple   Dysphagia, unspecified(787.20)   Discharge Condition: Stable  Diet recommendation: Heart healthy  Filed Weights   09/26/12 2157 09/27/12 2128 09/28/12 2022  Weight: 57.295 kg (126 lb 5 oz) 57.295 kg (126 lb 5 oz) 59.1 kg (130 lb 4.7 oz)    History of present illness:  79 year old female patient with extensive past medical history (HTN, hypothyroidism, chronic systolic CHF, CAD, pulmonary hypertension, aortic insufficiency, chronic kidney disease, throat cancer) was admitted to the hospital on 09/24/12 with one-day history of productive cough, dyspnea and fever of 101.76F. Patient use intermittent home oxygen. Per daughter, O2 sats at home and 85% on room air. In the ED she was documented to be febrile to 100.6, CXR demonstrated LLL opacity. Patient also had mildly elevated WBC, put on rocephin and azithromycin and admitted for CAP. TODAY states feels better as SPO2 equal to 95% on room. Patient was able to ambulate a pulmonary with daughter on room air SpO2 nadir= 92%   Hospital Course:    Procedures:  Consultations:  Discharge Exam: Filed Vitals:   09/28/12 2017 09/28/12 2022 09/29/12 0544 09/29/12 0730  BP:  129/46 151/81 146/84  Pulse: 85 87 87 84  Temp:  97.9 F (36.6 C) 98.1 F (36.7 C) 98.3 F (36.8 C)  TempSrc:  Oral Oral Oral  Resp: 18 18 18 18   Height:      Weight:  59.1 kg (130 lb 4.7 oz)  SpO2: 99% 100% 100% 100%   General: Alert ,NAD  Cardiovascular: Regular in rate, grade 4 systolic murmur (been there since a child)  Respiratory: Clear to auscultation except for crackles left lower lung   Abdomen: Soft nontender nondistended plus bowel sounds   Discharge Instructions     Medication List          amLODipine 5 MG tablet  Commonly known as:  NORVASC  Take 1 tablet (5 mg total) by mouth daily.     aspirin 81 MG EC tablet  Take 1 tablet (81 mg total) by mouth daily.     calcium carbonate 500 MG chewable tablet  Commonly known as:  TUMS - dosed in mg elemental calcium  Chew 3 tablets by mouth daily.     carvedilol 12.5 MG tablet  Commonly known as:  COREG  Take 12.5 mg by mouth 2 (two) times daily with a meal.     ENSURE PLUS Liqd  Take 237 mLs by mouth daily.     ferrous gluconate 225 (27 FE) MG tablet  Commonly known as:  FERGON  Take 1 tablet (240 mg total) by mouth 3 (three) times daily with meals.     furosemide 20 MG tablet  Commonly known as:  LASIX  Take 0.5 tablets (10 mg total) by mouth daily.     isosorbide dinitrate 10 MG tablet  Commonly known as:  ISORDIL  Take 10 mg by mouth 2 (two) times daily.     levofloxacin 500 MG tablet  Commonly known as:  LEVAQUIN  Take 1 tablet (500 mg total) by mouth daily.     levothyroxine 75 MCG tablet  Commonly known as:  SYNTHROID, LEVOTHROID  Take 75 mcg by mouth daily.     Melatonin 3 MG Tabs  Take 6 mg by mouth at bedtime.     nitroGLYCERIN 0.4 MG SL tablet  Commonly known as:  NITROSTAT  Place 0.4 mg under the tongue every 5 (five) minutes as needed. x3 doses as needed for chest pain     pantoprazole 40 MG tablet  Commonly known as:  PROTONIX  Take 40 mg by mouth daily.     potassium chloride 10 MEQ tablet  Commonly known as:  K-DUR  Take 10 mEq by mouth daily.     temazepam 30 MG capsule  Commonly known as:  RESTORIL  Take 30 mg by mouth at bedtime. Takes with 15mg  capsule     TYLENOL 500 MG tablet  Generic drug:  acetaminophen  Take 1,000 mg by mouth every 6 (six) hours as needed for pain. Pain/fever       Allergies  Allergen Reactions  . Demerol [Meperidine]     hallucinations  . Doxycycline Hives and Nausea And Vomiting    Headaches   . Enoxaparin Sodium Other (See Comments)    unknown  .  Hydrocodone-Acetaminophen Other (See Comments)    Syncope when taken with temazepam  . Loratadine Other (See Comments)    Clariting D-syncope  . Oxycodone-Acetaminophen Itching  . Red Dye Other (See Comments)    Affects veins in 1987   Follow-up Information   Follow up with [name], MD In 7 days.   Specialty:  Family Medicine   Contact information:   [facility] [address] [place] [address] [phone]       Follow up with [facility] [place]. Schedule an appointment as soon as possible for a visit in  7 days. (Possible pulmonary nodule left upper lung and right parahilar region evaluate for neoplasm)    Specialty:  Pulmonology   Contact information:   [address] [place] [address] [phone]       The results of significant diagnostics from this hospitalization (including imaging, microbiology, ancillary and laboratory) are listed below for reference.    Significant Diagnostic Studies: Dg Chest 2 View  09/28/2012   *RADIOLOGY REPORT*  Clinical Data: Pneumonia, evaluate for interval change  CHEST - 2 VIEW  Comparison: Prior chest x-ray 09/24/2012  Findings: Improving aeration with decreased interstitial and airspace disease in the right base and throughout the left lung. There is persistent elevation of the left hemidiaphragm with associated left basilar atelectasis.  Persistent residual nodular opacity in the left upper lung with adjacent architectural distortion.  Probable pulmonary nodule in the right mid lung as well.  Stable cardiomegaly.  Aortic atherosclerosis.  Degenerative changes in the bilateral glenohumeral joints.  No acute osseous abnormality.  IMPRESSION:  1.  Improving aeration with decreasing airspace disease in the right base and throughout the left lung. 2.  Persistent nodular consolidation versus pulmonary nodule in the left upper lung. 3.  Persistent elevation of the  left hemidiaphragm with associated left basilar atelectasis. 4.  Pulmonary nodule versus prominent vascular shadow in the right perihilar region is similar compared to prior imaging.  Recommend continued imaging followup to resolution.  If clinically warranted, CT of the chest could further evaluate.   Original Report Authenticated By: Malachy Moan, M.D.   Dg Chest 2 View  09/24/2012   *RADIOLOGY REPORT*  Clinical Data: Shortness of breath.  Cough.  Fever.  CHEST - 2 VIEW  Comparison: 06/08/2012  Findings: The patient is rotated to the right on today's exam, resulting in reduced diagnostic sensitivity and specificity. Indistinct airspace opacity noted in the left lung, particularly at the left lower lobe and potentially the posterior lingula.  Interstitial accentuation is present bilaterally.  Borderline cardiomegaly.  IMPRESSION:  1.  Airspace opacity at the left lung base, including the left lower lobe and likely the lingula, potentially from pneumonia or asymmetric edema.  There is mild interstitial accentuation bilaterally. 2.  Borderline cardiomegaly.   Original Report Authenticated By: Gaylyn Rong, M.D.   Dg Swallowing Func-speech Pathology  09/27/2012   Carolan Shiver, CCC-SLP     09/27/2012  2:34 PM Objective Swallowing Evaluation: Modified Barium Swallowing Study   Patient Details  Name: KERRIE TIMM MRN: [id number] Date of Birth: 06/15/1915  Today's Date: 09/27/2012 Time: 1335-141112:57-13:20  SLP Time Calculation (min): 36 min; 23 min  Past Medical History:  Past Medical History  Diagnosis Date  . Hypertension   . Hypothyroidism   . Cardiomegaly   . CHF (congestive heart failure)     Recent hospitalization for CHF & pneumonia -- EF is 40-45%  (  03/16/10 by Dr. Peter [place] )    . History of rheumatic fever   . Coronary artery disease     EF of 40-45% with history of angioplasty  . Pulmonary hypertension     with PA pressure estimated at 53 mmHg  . Pulmonary edema   . Hypoxia   .  Aortic insufficiency     moderate-severe  . Arthritis   . Anemia   . Complication of anesthesia     wakes up slowly  . History of blood transfusion 1980's; 2005    "8 pints w/1st knee OR; 3 pints 2nd  knee OR" (06/07/2012)  . Heart murmur     "since age 19" (06/07/2012)  . Asthma   . 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 Shortness of breath     "can happen at any time" (06/07/2012)  . Dyspnea   . Pneumonia     "a few times in my life; used to have it q yr; last time was  01/21/2012" (06/07/2012)  . CKD (chronic kidney disease) 01/21/2012  . GERD (gastroesophageal reflux disease)   . Stroke 1980's    denies residual on 06/07/2012  . SIRS (systemic inflammatory response syndrome) 06/07/2012   Past Surgical History:  Past Surgical History  Procedure Laterality Date  . Thyroidectomy, partial  1998  . Hammer toe surgery Bilateral 1980's  . Joint replacement      bilat  . Tonsillectomy  1934  . Cataract extraction w/ intraocular lens implant Right 2005  . Replacement total knee Bilateral 1980's; 2006    "right; left" (06/07/2012)  . Knee arthroscopy Right 1980's  . Coronary angioplasty  1990's   HPI:  79 year old female patient with extensive past medical history  (HTN, hypothyroidism, chronic systolic CHF, CAD, pulmonary  hypertension, aortic insufficiency, chronic kidney disease,  throat cancer with surgery and radiation 2002) was admitted to  the hospital on 09/24/12 with one-day history of productive cough,  dyspnea and fever of 101.46F. Patient use intermittent home  oxygen. Per daughter, O2 sats at home and 85% on room air. In the  ED she was documented to be febrile to 100.6, CXR demonstrated  LLL opacity. Patient also had mildly elevated WBC, put on  rocephin and azithromycin and admitted for CAP.  Bedside swallow  eval revealed +s/s aspiration; pt with chronic dysphagia.       Assessment / Plan / Recommendation Clinical Impression  Dysphagia Diagnosis: Moderate pharyngeal phase  dysphagia;  deficits noted per screen in cervical esophagus  Clinical impression: Pt presents with a moderate pharyngeal  dysphagia with cervical esophageal impairment. Condition is  likely chronic, with pt decompensating during acute medical  events and swallow function deteriorating.  There is minimal  mobility of the hyolaryngeal complex, likely due to fibrosis from  radiation 2002.  Larynx does not elevate, epiglottis does not  invert over larynx, and this leads to overt aspiration of thin  liquids and penetration of nectars to the vocal folds (without  consistent cough response.)  Material passes through UES and  tends to accrue in cervical esophagus, with eventual passage into  distal esophagus and through LES.    Spoke at length with pt and her daughter, [name], after the  study.  We discussed the nature of this dysphagia, its  deterioration during acute medical events, the presence of  aspiration and our inability to prevent it, only minimize it.   Pt/daughter verbalize understanding.  They would like to limit  liquids to honey-thick temporarily, with the intent to re-assess  at bedside at regular intervals and liberalize liquids when  appropriate.      Treatment Recommendation  Therapy as outlined in treatment plan below    Diet Recommendation Dysphagia 3 (Mechanical Soft);Honey-thick  liquid   Liquid Administration via: Cup Medication Administration: Crushed with puree Supervision: Patient able to self feed;Trained caregiver to feed  patient Compensations: Slow rate;Small sips/bites;Clear throat  intermittently Postural Changes and/or Swallow Maneuvers: Out of bed for  meals;Upright 30-60 min after meal    Other  Recommendations Oral Care Recommendations:  Oral care BID Other Recommendations: Order thickener from pharmacy   Follow Up Recommendations  Other (comment) (tba)    Frequency and Duration min 2x/week  2 weeks   Pertinent Vitals/Pain No pain    SLP Swallow Goals Patient will utilize recommended  strategies during swallow to  increase swallowing safety with: Supervision/safety   General Type of Study: Modified Barium Swallowing Study Previous Swallow Assessment: MBS 2012, recommend nectar thick  liquids Diet Prior to this Study: Regular;Thin liquids Temperature Spikes Noted: No Respiratory Status: Supplemental O2 delivered via (comment) History of Recent Intubation: No Behavior/Cognition: Alert;Cooperative;Pleasant mood Oral Cavity - Dentition: Dentures, top;Dentures, bottom Oral Motor / Sensory Function: Within functional limits Self-Feeding Abilities: Able to feed self Patient Positioning: Upright in bed Baseline Vocal Quality: Hoarse;Other (comment) Volitional Cough: Congested Volitional Swallow: Able to elicit Anatomy: Other (Comment) (kyphosis; presence of large  cricopharyngeus) Pharyngeal Secretions: Not observed secondary MBS    Reason for Referral     Oral Phase Oral Preparation/Oral Phase Oral Phase: WFL   Pharyngeal Phase  Pharyngeal Phase: Impaired  Pharyngeal - Honey Cup: Delayed swallow initiation;Reduced  pharyngeal peristalsis;Reduced epiglottic inversion;Reduced  anterior laryngeal mobility;Reduced laryngeal elevation;Reduced  airway/laryngeal closure;Pharyngeal residue - posterior pharnyx  Pharyngeal - Nectar Cup: Delayed swallow initiation;Reduced  pharyngeal peristalsis;Reduced epiglottic inversion;Reduced  anterior laryngeal mobility;Reduced laryngeal elevation;Reduced  airway/laryngeal closure;Pharyngeal residue - posterior  pharnyx;Penetration/Aspiration during swallow Penetration/Aspiration details (nectar cup): Material enters  airway, CONTACTS cords and not ejected out  Pharyngeal - Thin Cup: Delayed swallow initiation;Reduced  pharyngeal peristalsis;Reduced epiglottic inversion;Reduced  anterior laryngeal mobility;Reduced laryngeal elevation;Reduced  airway/laryngeal closure;Pharyngeal residue - posterior  pharnyx;Penetration/Aspiration during swallow;Moderate aspiration  Penetration/Aspiration details (thin cup): Material enters  airway, passes BELOW cords without attempt by patient to eject  out (silent aspiration)  Cervical Esophageal Phase    GO    Cervical Esophageal Phase Cervical Esophageal Phase: Impaired Cervical Esophageal Phase - Comment Cervical Esophageal Comment: Large cricopharyngeal bar; barium  residue sits in cervical esophagus; minimal peristalsis observed        Amanda L. Samson Frederic, [place] CCC/SLP Pager [phone]  Blenda Mounts Laurice 09/27/2012, 2:29 PM     Microbiology: Recent Results (from the past 240 hour(s))  CULTURE, BLOOD (ROUTINE X 2)     Status: None   Collection Time    09/24/12  8:55 PM      Result Value Range Status   Specimen Description BLOOD RIGHT ARM   Final   Special Requests BOTTLES DRAWN AEROBIC ONLY 10CC   Final   Culture  Setup Time     Final   Value: 09/25/2012 02:39     Performed at [facility]   Culture     Final   Value:        BLOOD CULTURE RECEIVED NO GROWTH TO DATE CULTURE WILL BE HELD FOR 5 DAYS BEFORE ISSUING A FINAL NEGATIVE REPORT     Performed at [facility]   Report Status PENDING   Incomplete  CULTURE, BLOOD (ROUTINE X 2)     Status: None   Collection Time    09/24/12  9:03 PM      Result Value Range Status   Specimen Description BLOOD RIGHT ARM   Final   Special Requests BOTTLES DRAWN AEROBIC ONLY 10CC   Final   Culture  Setup Time     Final   Value: 09/25/2012 02:39     Performed at [facility]   Culture     Final   Value:  BLOOD CULTURE RECEIVED NO GROWTH TO DATE CULTURE WILL BE HELD FOR 5 DAYS BEFORE ISSUING A FINAL NEGATIVE REPORT     Performed at [facility]   Report Status PENDING   Incomplete  CULTURE, EXPECTORATED SPUTUM-ASSESSMENT     Status: None   Collection Time    09/25/12 10:43 AM      Result Value Range Status   Specimen Description SPUTUM   Final   Special Requests NONE   Final   Sputum evaluation     Final   Value: THIS SPECIMEN IS  ACCEPTABLE. RESPIRATORY CULTURE REPORT TO FOLLOW.   Report Status 09/25/2012 FINAL   Final  CULTURE, RESPIRATORY (NON-EXPECTORATED)     Status: None   Collection Time    09/25/12 10:43 AM      Result Value Range Status   Specimen Description SPUTUM   Final   Special Requests NONE   Final   Gram Stain     Final   Value: ABUNDANT WBC PRESENT,BOTH PMN AND MONONUCLEAR     FEW SQUAMOUS EPITHELIAL CELLS PRESENT     RARE GRAM NEGATIVE RODS     Performed at [facility]     Performed at [facility]   Culture     Final   Value: MODERATE KLEBSIELLA PNEUMONIAE     Performed at [facility]   Report Status 09/28/2012 FINAL   Final   Organism ID, Bacteria KLEBSIELLA PNEUMONIAE   Final  URINE CULTURE     Status: None   Collection Time    09/25/12  2:00 PM      Result Value Range Status   Specimen Description URINE, CATHETERIZED   Final   Special Requests NONE   Final   Culture  Setup Time     Final   Value: 09/25/2012 15:10     Performed at [facility] Count     Final   Value: NO GROWTH     Performed at [facility]   Culture     Final   Value: NO GROWTH     Performed at [facility]   Report Status 09/26/2012 FINAL   Final     Labs: Basic Metabolic Panel:  Recent Labs Lab 09/25/12 0530 09/26/12 0530 09/27/12 0616 09/28/12 0445 09/29/12 0655  NA 141 138 138 136 140  K 4.1 4.1 4.1 4.2 4.3  CL 106 102 100 97 99  CO2 27 26 32 31 28  GLUCOSE 76 79 90 85 117*  BUN 32* 37* 31* 32* 35*  CREATININE 1.55* 1.53* 1.39* 1.29* 1.17*  CALCIUM 8.8 9.2 9.3 9.2 10.1   Liver Function Tests:  Recent Labs Lab 09/28/12 0445 09/29/12 0655  AST 14 18  ALT 7 9  ALKPHOS 87 90  BILITOT 0.2* 0.1*  PROT 6.6 7.3  ALBUMIN 2.5* 2.8*   No results found for this basename: LIPASE, AMYLASE,  in the last 168 hours No results found for this basename: AMMONIA,  in the last 168 hours CBC:  Recent Labs Lab 09/24/12 2027 09/25/12 0530  09/26/12 0530 09/28/12 0445 09/29/12 0655  WBC 12.1* 13.4* 12.6* 5.8 8.1  NEUTROABS 11.0*  --   --  3.9 6.8  HGB 11.4* 9.8* 9.7* 9.5* 10.9*  HCT 35.8* 29.8* 30.5* 29.9* 34.5*  MCV 91.1 90.9 92.1 91.2 90.6  PLT 185 157 156 170 177   Cardiac Enzymes: No results found for this basename: CKTOTAL, CKMB, CKMBINDEX, TROPONINI,  in  the last 168 hours BNP: BNP (last 3 results)  Recent Labs  04/02/12 1834 06/07/12 1000 06/07/12 1550  PROBNP 1761.0* 2076.0* 3203.0*   CBG: No results found for this basename: GLUCAP,  in the last 168 hours     Signed:  Carolyne Littles, J  [facility] 09/29/2012, 9:43 AM

## 2012-09-29 NOTE — Progress Notes (Signed)
Speech Language Pathology Dysphagia Treatment Patient Details Name: [name] MRN: [id number] DOB: 07-20-1915 Today's Date: 09/29/2012 Time: 1410-1430 SLP Time Calculation (min): 20 min  Assessment / Plan / Recommendation Clinical Impression  Pt continues to appear to tolerate current diet well per observation.  (no overt s/s aspiration)  CXR shows improved lung status.  Discussed with dtr, who assists with prep of all her mother's meals.  Discussed balance of safety and quality-of-life; they understand risk of aspiration remains high; pt may choose to eat/drink per her preferences.   For D/C today.  Rec continued dys 3 with upgrade to nectar-thick.    Diet Recommendation  Continue with Current Diet: Dysphagia 3 (mechanical soft);Nectar-thick liquid    SLP Plan All goals met   Pertinent Vitals/Pain No pain   Swallowing Goals  SLP Swallowing Goals Patient will utilize recommended strategies during swallow to increase swallowing safety with: Supervision/safety Swallow Study Goal #2 - Progress: Met  General Temperature Spikes Noted: No Respiratory Status: Supplemental O2 delivered via (comment) Behavior/Cognition: Alert;Cooperative;Pleasant mood Oral Cavity - Dentition: Dentures, top;Dentures, bottom Patient Positioning: Upright in bed  Oral Cavity - Oral Hygiene Does patient have any of the following "at risk" factors?: Oxygen therapy - cannula, mask, simple oxygen devices Patient is AT RISK - Oral Care Protocol followed (see row info): Yes   Dysphagia Treatment Treatment focused on: Skilled observation of diet tolerance;Patient/family/caregiver Dealer Educated: daughter Treatment Methods/Modalities: Skilled observation Patient observed directly with PO's: Yes Feeding: Able to feed self Liquids provided via: Cup Pharyngeal Phase Signs & Symptoms: Suspected delayed swallow initiation;Multiple swallows Type of cueing: Verbal   GO    [name],  [place] CCC/SLP Pager [phone]  Blenda Mounts Laurice 09/29/2012, 2:39 PM

## 2012-09-29 NOTE — Progress Notes (Signed)
Physical Therapy Treatment Patient Details Name: [name] MRN: [id number] DOB: [birth year] Today's Date: 09/29/2012 Time: 7829-5621 PT Time Calculation (min): 23 min  PT Assessment / Plan / Recommendation  History of Present Illness 79 year old female patient with extensive past medical history (HTN, hypothyroidism, chronic systolic CHF, CAD, pulmonary hypertension, aortic insufficiency, chronic kidney disease, throat cancer) was admitted to the hospital on 09/24/12 with one-day history of productive cough, dyspnea and fever of 101.5F. Patient use intermittent home oxygen. Per daughter, O2 sats at home and 85% on room air. In the ED she was documented to be febrile to 100.6, CXR demonstrated LLL opacity. Patient also had mildly elevated WBC, put on rocephin and azithromycin and admitted for CAP.   PT Comments   Pt able to increase ambulation distance today and reports feeling better.  Pt also stated she performs standing "sink exercises", ambulates short distance 5x/day, and also performs chair exercises.  Would have liked to observe pt perform some home exercises however pt felt too fatigued and pt left on [facility] with daughter assist.   Follow Up Recommendations  No PT follow up     Does the patient have the potential to tolerate intense rehabilitation     Barriers to Discharge        Equipment Recommendations  None recommended by PT    Recommendations for Other Services    Frequency     Progress towards PT Goals Progress towards PT goals: Progressing toward goals  Plan Current plan remains appropriate    Precautions / Restrictions Precautions Precautions: Fall Precaution Comments: chronic O2 Restrictions Weight Bearing Restrictions: No   Pertinent Vitals/Pain SaO2 93% room air and HR 102 after ambulation    Mobility  Bed Mobility Bed Mobility: Supine to Sit Supine to Sit: 5: Supervision;HOB elevated Sitting - Scoot to Edge of Bed: 5: Supervision Details for Bed Mobility  Assistance: Minguard for safety with cues for technique.   Transfers Transfers: Stand to Sit;Sit to Stand Sit to Stand: 4: Min guard;With upper extremity assist;From bed Stand to Sit: 4: Min guard;With upper extremity assist;To chair/3-in-1 Details for Transfer Assistance: daughter provided appropriate verbal cues for safe technique Ambulation/Gait Ambulation/Gait Assistance: 4: Min guard Ambulation Distance (Feet): 240 Feet Assistive device: Rolling walker Ambulation/Gait Assistance Details: pt reciting poem and bible verses during ambulation and reports a little SOB upon return to room but states much improvement over last few days Gait Pattern: Step-through pattern;Trunk flexed;Decreased stride length Gait velocity: decreased General Gait Details: SaO2 93% room air upon return to room after ambulation Stairs: No    Exercises     PT Diagnosis:    PT Problem List:   PT Treatment Interventions:     PT Goals (current goals can now be found in the care plan section)    Visit Information  Last PT Received On: 09/29/12 Assistance Needed: +1 History of Present Illness: 79 year old female patient with extensive past medical history (HTN, hypothyroidism, chronic systolic CHF, CAD, pulmonary hypertension, aortic insufficiency, chronic kidney disease, throat cancer) was admitted to the hospital on 09/24/12 with one-day history of productive cough, dyspnea and fever of 101.5F. Patient use intermittent home oxygen. Per daughter, O2 sats at home and 85% on room air. In the ED she was documented to be febrile to 100.6, CXR demonstrated LLL opacity. Patient also had mildly elevated WBC, put on rocephin and azithromycin and admitted for CAP.    Subjective Data      Cognition  Cognition Arousal/Alertness: Awake/alert Behavior During Therapy:  WFL for tasks assessed/performed Overall Cognitive Status: Within Functional Limits for tasks assessed    Balance     End of Session PT - End of  Session Activity Tolerance: Patient tolerated treatment well Patient left: with call bell/phone within reach;with family/visitor present;Other (comment) (daughter assisting with hygiene with pt on [facility])   GP     [name],[name] 09/29/2012, 10:15 AM Zenovia Jarred, PT, DPT 09/29/2012 Pager: [phone]

## 2012-10-01 LAB — CULTURE, BLOOD (ROUTINE X 2): Culture: NO GROWTH

## 2012-10-03 ENCOUNTER — Ambulatory Visit (INDEPENDENT_AMBULATORY_CARE_PROVIDER_SITE_OTHER): Payer: Medicare Other | Admitting: Internal Medicine

## 2012-10-03 ENCOUNTER — Encounter: Payer: Self-pay | Admitting: Internal Medicine

## 2012-10-03 VITALS — BP 140/70 | HR 60 | Temp 98.7°F | Ht <= 58 in | Wt 121.0 lb

## 2012-10-03 DIAGNOSIS — I10 Essential (primary) hypertension: Secondary | ICD-10-CM

## 2012-10-03 DIAGNOSIS — R918 Other nonspecific abnormal finding of lung field: Secondary | ICD-10-CM

## 2012-10-03 DIAGNOSIS — J189 Pneumonia, unspecified organism: Secondary | ICD-10-CM

## 2012-10-03 DIAGNOSIS — R0902 Hypoxemia: Secondary | ICD-10-CM

## 2012-10-03 MED ORDER — CARVEDILOL 12.5 MG PO TABS: ORAL_TABLET | ORAL | Status: AC

## 2012-10-03 NOTE — Assessment & Plan Note (Signed)
See comments listed under CAP re further w/u.   Discussed in detail all the  indications, usual  risks and alternatives  relative to the benefits with patient's daughter who did not really understand that we don't pursue tissue diagnoses if we're not going to consider any change in therapy and that's definitely the case here.  Hopefully her "nodules" are nothing more than areas of aspiration/organizing pna

## 2012-10-03 NOTE — Progress Notes (Signed)
Subjective:     Patient ID: [name], female   DOB: 08/31/1915   MRN: [id number]  HPI  79 yobf never smoked primary pt of Dr Lupe Carney  Admit date: 09/24/2012  Discharge date: 09/29/2012  Time spent: 35 minutes  Recommendations for Outpatient Follow-up:  1. Community acquired pneumonia/? Aspiration PNA, with SIRS on admission: Left lower lobe pneumonia obtain a.m. chest x-ray. Continue IV Rocephin and azithromycin day 4/7. We'll convert over to levofloxacin for the patient's last 3 days of antibiotics in order to allow discharge. Continue patient on a 3 day course of high-dose steroids in the a.m, and Mucinex DM, and pulmonary toilet. Counseled patient and daughter that there appeared to be 2 nodules in her lung which are now visible secondary to clearing of the pneumonia. Overnight family decided that they would at a minimum request chest CT to further evaluate nodules. We'll also refer patient to pulmonologist at [facility].  2. Lung nodules; see #1 3. Dysphagia; continue modified diet.  4. Hypoxia/Acute on chronic hypoxic respiratory failure: Resolved patient SpO2 = 95% on room air . 5. Hypertension: Slightly above AHA guidelines but given patient's age and illness would not try to lower this time . 6. Chronic kidney disease: Creatinine close to baseline. 7. Anemia: Probably at baseline. stable 8. Acute on Chronic systolic CHF: Diffuse crackles  Insomnia; continue Ttemazepam at 30 mg at bedtime. Monitor  Procedure CXR 09/28/2012  1. Improving aeration with decreasing airspace disease in the  right base and throughout the left lung.  2. Persistent nodular consolidation versus pulmonary nodule in the  left upper lung.  3. Persistent elevation of the left hemidiaphragm with associated  left basilar atelectasis.  4. Pulmonary nodule versus prominent vascular shadow in the right  perihilar region is similar compared to prior imaging.  Discharge Diagnoses:  Principal Problem:  CAP  (community acquired pneumonia)  Active Problems:  HYPERTENSION  CKD (chronic kidney disease)  SIRS (systemic inflammatory response syndrome)  Respiratory failure, acute and chronic  Acute on chronic systolic CHF (congestive heart failure)  Lung nodule, multiple  Dysphagia, unspecified(787.20)  Discharge Condition: Stable  Diet recommendation: Heart healthy  Filed Weights    09/26/12 2157  09/27/12 2128  09/28/12 2022   Weight:  57.295 kg (126 lb 5 oz)  57.295 kg (126 lb 5 oz)  59.1 kg (130 lb 4.7 oz)   History of present illness:  79 year old female patient with extensive past medical history (HTN, hypothyroidism, chronic systolic CHF, CAD, pulmonary hypertension, aortic insufficiency, chronic kidney disease, throat cancer) was admitted to the hospital on 09/24/12 with one-day history of productive cough, dyspnea and fever of 101.65F. Patient use intermittent home oxygen. Per daughter, O2 sats at home and 85% on room air. In the ED she was documented to be febrile to 100.6, CXR demonstrated LLL opacity. Patient also had mildly elevated WBC, put on rocephin and azithromycin and admitted for CAP. TODAY states feels better as SPO2 equal to 95% on room. Patient was able to ambulate a pulmonary with daughter on room air SpO2 nadir= 92%   10/03/2012   New pt eval/ Ellory Khurana referred by [facility] for MPN's Chief Complaint  Patient presents with  . Post Hospital Follow up    Breathing doing well overall.  Does have occas SOB and prod cough with "almost clear" mucus. No wheezing, chest tightness, or chest pain.     main c/o sometimes too weak to stand, not taking liquids in as well since diet was  modified by speech therapy, but overall doing better since discharge and no more fever or purulent sputumand able to ambulate at home with walker when not overtly orthostatic     No obvious daytime variabilty or assoc  cp or chest tightness, subjective wheeze overt sinus or hb symptoms. No unusual exp hx or h/o  childhood pna/ asthma or knowledge of premature birth.   Sleeping ok on 02 1.5 lpm chronically (though not consistently using it) without nocturnal  or early am exacerbation  of respiratory  c/o's or need for noct saba. Also denies any obvious fluctuation of symptoms with weather or environmental changes or other aggravating or alleviating factors except as outlined above   Current Medications, Allergies, Past Medical History, Past Surgical History, Family History, and Social History were reviewed in [employer] record.  ROS  The following are not active complaints unless bolded sore throat, dysphagia, dental problems, itching, sneezing,  nasal congestion or excess/ purulent secretions, ear ache,   fever, chills, sweats, unintended wt loss, pleuritic or exertional cp, hemoptysis,  orthopnea pnd or leg swelling, presyncope, palpitations, heartburn, abdominal pain, anorexia, nausea, vomiting, diarrhea  or change in bowel or urinary habits, change in stools or urine, dysuria,hematuria,  rash, arthralgias, visual complaints, headache, numbness weakness or ataxia or problems with walking or coordination,  change in mood/affect or memory.      Review of Systems     Objective:   Physical Exam    elderly bf nad slumped over in w/c and lets her daughter answer all the questions but actually quite alert and pleasant  Wt Readings from Last 3 Encounters:  10/03/12 121 lb (54.885 kg)  09/28/12 130 lb 4.7 oz (59.1 kg)  06/09/12 129 lb 3 oz (58.6 kg)     HEENT: nl dentition, turbinates, and orophanx. Nl external ear canals without cough reflex   NECK :  without JVD/Nodes/TM/ nl carotid upstrokes bilaterally   LUNGS: no acc muscle use, clear to A and P bilaterally without cough on insp or exp maneuvers   CV:  RRR  no s3 or murmur or increase in P2, no edema   ABD:  soft and nontender with nl excursion in the supine position. No bruits or organomegaly, bowel sounds nl  MS:   warm without deformities, calf tenderness, cyanosis or clubbing  SKIN: warm and dry without lesions        cxr  09/28/12 . Improving aeration with decreasing airspace disease in the  right base and throughout the left lung.  2. Persistent nodular consolidation versus pulmonary nodule in the  left upper lung.  3. Persistent elevation of the left hemidiaphragm with associated  left basilar atelectasis.  4. Pulmonary nodule versus prominent vascular shadow in the right  perihilar region is similar compared to prior imaging.  Assessment:

## 2012-10-03 NOTE — Patient Instructions (Addendum)
Reduce the corevidol  (12.5 mg)   Take one half twice daily instead of a whole  Hold lasix anytime light headed standing   Continue 02 for now at 1.5 lpm  bedtime and as needed during the day   Keep appointment with Dr Clovis Riley and I will send a copy of my recommendations

## 2012-10-03 NOTE — Assessment & Plan Note (Signed)
Over treated at present and bradycardic and orthostatic > rec reduce coreg by half and hold lasix when light headed standing

## 2012-10-03 NOTE — Assessment & Plan Note (Signed)
Esophagram 09/11/09 -1. Small sliding hiatal hernia with reflux and moderate stricture.  2. Severe dysmotility.  I suspect she's intermittently aspirating along with all her other chronic geriatric conditions and strongly advised a "less is more" approach here but would certainly no pursue CT chest or invasive pulmonary w/u at this point

## 2012-12-21 ENCOUNTER — Other Ambulatory Visit: Payer: Self-pay

## 2013-03-01 ENCOUNTER — Emergency Department (HOSPITAL_COMMUNITY)
Admission: EM | Admit: 2013-03-01 | Discharge: 2013-03-18 | Disposition: E | Payer: Medicare Other | Attending: Emergency Medicine | Admitting: Emergency Medicine

## 2013-03-01 DIAGNOSIS — I129 Hypertensive chronic kidney disease with stage 1 through stage 4 chronic kidney disease, or unspecified chronic kidney disease: Secondary | ICD-10-CM | POA: Insufficient documentation

## 2013-03-01 DIAGNOSIS — Z9889 Other specified postprocedural states: Secondary | ICD-10-CM | POA: Insufficient documentation

## 2013-03-01 DIAGNOSIS — I2510 Atherosclerotic heart disease of native coronary artery without angina pectoris: Secondary | ICD-10-CM | POA: Insufficient documentation

## 2013-03-01 DIAGNOSIS — R404 Transient alteration of awareness: Secondary | ICD-10-CM | POA: Insufficient documentation

## 2013-03-01 DIAGNOSIS — IMO0002 Reserved for concepts with insufficient information to code with codable children: Secondary | ICD-10-CM | POA: Insufficient documentation

## 2013-03-01 DIAGNOSIS — Z8619 Personal history of other infectious and parasitic diseases: Secondary | ICD-10-CM | POA: Insufficient documentation

## 2013-03-01 DIAGNOSIS — D649 Anemia, unspecified: Secondary | ICD-10-CM | POA: Insufficient documentation

## 2013-03-01 DIAGNOSIS — E039 Hypothyroidism, unspecified: Secondary | ICD-10-CM | POA: Insufficient documentation

## 2013-03-01 DIAGNOSIS — Z85819 Personal history of malignant neoplasm of unspecified site of lip, oral cavity, and pharynx: Secondary | ICD-10-CM | POA: Insufficient documentation

## 2013-03-01 DIAGNOSIS — K219 Gastro-esophageal reflux disease without esophagitis: Secondary | ICD-10-CM | POA: Insufficient documentation

## 2013-03-01 DIAGNOSIS — Z8673 Personal history of transient ischemic attack (TIA), and cerebral infarction without residual deficits: Secondary | ICD-10-CM | POA: Insufficient documentation

## 2013-03-01 DIAGNOSIS — Z79899 Other long term (current) drug therapy: Secondary | ICD-10-CM | POA: Insufficient documentation

## 2013-03-01 DIAGNOSIS — M129 Arthropathy, unspecified: Secondary | ICD-10-CM | POA: Insufficient documentation

## 2013-03-01 DIAGNOSIS — I469 Cardiac arrest, cause unspecified: Secondary | ICD-10-CM | POA: Insufficient documentation

## 2013-03-01 DIAGNOSIS — R011 Cardiac murmur, unspecified: Secondary | ICD-10-CM | POA: Insufficient documentation

## 2013-03-01 DIAGNOSIS — I509 Heart failure, unspecified: Secondary | ICD-10-CM | POA: Insufficient documentation

## 2013-03-01 DIAGNOSIS — N189 Chronic kidney disease, unspecified: Secondary | ICD-10-CM | POA: Insufficient documentation

## 2013-03-01 DIAGNOSIS — Z7982 Long term (current) use of aspirin: Secondary | ICD-10-CM | POA: Insufficient documentation

## 2013-03-01 DIAGNOSIS — Z8701 Personal history of pneumonia (recurrent): Secondary | ICD-10-CM | POA: Insufficient documentation

## 2013-03-01 DIAGNOSIS — J45909 Unspecified asthma, uncomplicated: Secondary | ICD-10-CM | POA: Insufficient documentation

## 2013-03-18 NOTE — Code Documentation (Signed)
Organ procurement team notified.  Pt not suitable for any donations.  Spoke with [name] Referral # [id number]

## 2013-03-18 NOTE — Progress Notes (Signed)
This on-call chaplain received referral from ED chaplain to offer continued grief support to family in ED. Escorted family from consultation room B to C26 to be with the deceased. Additional family and the pt's minister arrived. Family was emotional, but celebrating their mother's long, strong life, exchanging stories and pictures. Chaplain provided emotional and spiritual care, grief support, empathic listening, and caring presence. Family was at bedside with minister. Please page if additional support or assistance is needed.   Ethelene Browns [phone]

## 2013-03-18 NOTE — Code Documentation (Signed)
Family at beside. Family given emotional support. Chaplin in with family

## 2013-03-18 NOTE — ED Provider Notes (Signed)
I saw and evaluated the patient, reviewed the resident's note and I agree with the findings and plan.  EKG Interpretation   None       Pt arrives cpr in progress. Pt shows no signs of life, apneic, pulseless, in asystole. Pronounced dead.   Mirna Mires, MD 03/05/2013 1318

## 2013-03-18 NOTE — Progress Notes (Signed)
Chaplain rendered grief support to family of 80 year old pt.  Family openly shared with chaplain wonderful stories about pt's love and faith.  Chaplain introduced family chaplain Ramiro Harvest who is on call tonight.   03-26-2013 1700  Clinical Encounter Type  Visited With Family;Patient and family together  Visit Type Spiritual support;Death;ED  Spiritual Encounters  Spiritual Needs Grief support  Stress Factors  Family Stress Factors Loss    Estelle June, chaplain, pager [phone]

## 2013-03-18 NOTE — ED Provider Notes (Signed)
CSN: [id number]     Arrival date & time March 19, 2013  1543 History   First MD Initiated Contact with Patient Mar 19, 2013 1558     Chief Complaint CPR in progress   HPI: [name] is a 80 yo F with extensive medical history including but not limited to HTN, CAD, CHF, pulmonary HTN and CKD who presents via EMS as CPR in progress. History obtained from family and EMS. Her niece states she has complained of fatigue and SOB for the last couple of days. This afternoon she became unresponsive. EMS was called. She was apparently down for 10-15 minutes prior to EMS arrival. On their arrival she was in PEA. Intubated with ETCO2 around 25. She had seven rounds of epi, narcane and D50. En route she became asystolic. She received last dose of epi two minutes prior to arrival. First pulse check asystole.    Past Medical History  Diagnosis Date  . Hypertension   . Hypothyroidism   . Cardiomegaly   . CHF (congestive heart failure)     Recent hospitalization for CHF & pneumonia -- EF is 40-45%  ( 03/16/10 by Dr. Peter [place] )    . History of rheumatic fever   . Coronary artery disease     EF of 40-45% with history of angioplasty  . Pulmonary hypertension     with PA pressure estimated at 53 mmHg  . Pulmonary edema   . Hypoxia   . Aortic insufficiency     moderate-severe  . Arthritis   . Anemia   . Complication of anesthesia     wakes up slowly  . History of blood transfusion 1980's; 2005    "8 pints w/1st knee OR; 3 pints 2nd knee OR" (06/07/2012)  . Heart murmur     "since age 89" (06/07/2012)  . Asthma   . 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 Shortness of breath     "can happen at any time" (06/07/2012)  . Dyspnea   . Pneumonia     "a few times in my life; used to have it q yr; last time was 01/21/2012" (06/07/2012)  . CKD (chronic kidney disease) 01/21/2012  . GERD (gastroesophageal reflux disease)   . Stroke 1980's    denies residual on 06/07/2012  .  SIRS (systemic inflammatory response syndrome) 06/07/2012   Past Surgical History  Procedure Laterality Date  . Thyroidectomy, partial  1998  . Hammer toe surgery Bilateral 1980's  . Joint replacement      bilat  . Tonsillectomy  1934  . Cataract extraction w/ intraocular lens implant Right 2005  . Replacement total knee Bilateral 1980's; 2006    "right; left" (06/07/2012)  . Knee arthroscopy Right 1980's  . Coronary angioplasty  1990's   Family History  Problem Relation Age of Onset  . Stroke Father   . Ulcers Father   . Asthma Mother   . Cancer Brother   . Cancer Brother   . Cancer Brother   . Dementia Sister   . Congenital heart disease Sister   . Hypertension Son   . Hypertension Son   . Hypertension Son   . Hypertension Son   . Hypertension Son   . Hypertension Son   . Hypertension Son   . Hypertension Daughter   . Hypertension Daughter   . Hypertension Daughter    History  Substance Use Topics  . Smoking status: Never Smoker   . Smokeless tobacco:  Never Used  . Alcohol Use: No   OB History   Grav Para Term Preterm Abortions TAB SAB Ect Mult Living                 Review of Systems  Unable to perform ROS: Intubated    Allergies  Demerol; Doxycycline; Enoxaparin sodium; Hydrocodone-acetaminophen; Loratadine; Oxycodone-acetaminophen; Propoxyphene n-acetaminophen; and Red dye  Home Medications   Current Outpatient Rx  Name  Route  Sig  Dispense  Refill  . acetaminophen (TYLENOL) 500 MG tablet   Oral   Take 1,000 mg by mouth every 6 (six) hours as needed for pain. Pain/fever         . amLODipine (NORVASC) 5 MG tablet   Oral   Take 1 tablet (5 mg total) by mouth daily.   10 tablet   0   . aspirin EC 81 MG EC tablet   Oral   Take 1 tablet (81 mg total) by mouth daily.   30 tablet      . calcium carbonate (TUMS - DOSED IN MG ELEMENTAL CALCIUM) 500 MG chewable tablet   Oral   Chew 3 tablets by mouth daily.          . carvedilol (COREG) 12.5  MG tablet      One half twice daily         . diphenhydrAMINE (BENADRYL) 25 MG tablet   Oral   Take 25 mg by mouth as needed for itching.         . Ensure Plus (ENSURE PLUS) LIQD   Oral   Take 237 mLs by mouth daily.         . ferrous gluconate (FERGON) 225 (27 FE) MG tablet   Oral   Take 240 mg by mouth daily.         . furosemide (LASIX) 20 MG tablet   Oral   Take 10 mg by mouth.         Marland Kitchen guaiFENesin (MUCINEX) 600 MG 12 hr tablet   Oral   Take 1,200 mg by mouth at bedtime as needed for congestion.         . isosorbide dinitrate (ISORDIL) 10 MG tablet   Oral   Take 10 mg by mouth 2 (two) times daily.          Marland Kitchen levofloxacin (LEVAQUIN) 500 MG tablet   Oral   Take 1 tablet (500 mg total) by mouth daily.   4 tablet   0   . levothyroxine (SYNTHROID, LEVOTHROID) 75 MCG tablet   Oral   Take 75 mcg by mouth daily.           . Melatonin 3 MG TABS   Oral   Take 6 mg by mouth at bedtime.          . mupirocin ointment (BACTROBAN) 2 %      As needed         . nitroGLYCERIN (NITROSTAT) 0.4 MG SL tablet   Sublingual   Place 0.4 mg under the tongue every 5 (five) minutes as needed. x3 doses as needed for chest pain         . pantoprazole (PROTONIX) 40 MG tablet   Oral   Take 40 mg by mouth daily.         . potassium chloride (K-DUR) 10 MEQ tablet   Oral   Take 10 mEq by mouth daily.         . predniSONE (DELTASONE) 50 MG  tablet      1 tab po daily   2 tablet   0   . temazepam (RESTORIL) 30 MG capsule   Oral   Take 30 mg by mouth at bedtime. Takes with 15mg  capsule          BP 0/0  Pulse 0  Resp 0   Physical Exam  Nursing note and vitals reviewed. Constitutional: She appears toxic. She is intubated.  Elderly female in place, intubated, unresponsive.   HENT:  Head: Normocephalic and atraumatic.  ET tube in place  Eyes:  Pupils 3 mm, non-reactive.   Neck: Neck supple.  Cardiovascular:  Pulses:      Carotid pulses are 0  on the right side.      Femoral pulses are 0 on the right side. Pulmonary/Chest: She is intubated. She has decreased breath sounds (lung sounds present but diminshed. ).  Abdominal: Soft. She exhibits no distension. Bowel sounds are absent.  Neurological: She is unresponsive. GCS eye subscore is 1. GCS verbal subscore is 1. GCS motor subscore is 1.  No response to any stimulation. NO gag. Pupils non-reactive.   Skin: Skin is dry. No rash noted.  Skin cold and dry.     ED Course  Procedures (including critical care time) Labs Review Labs Reviewed - No data to display Imaging Review No results found.  EKG Interpretation   None       MDM   80 yo F with extensive medical history presents with CPR in progress. Downtime around 45 minutes total. Seven rounds of epinephrine. No signs of life on arrival. Bilateral breath sounds, ETCO2 25. Pulse check after moving patient to stretcher. No pulse, asystole on the monitor. Given prolonged downtime, multiple rounds of epi and asystole on the monitor the code was called. I spoke to the family about her condition. Chaplain at their side. They were allowed to visit the patient. I spoke to her family doctor, he will complete death certificate. Time of death 1537.   Clinical Impression 1. Cardiopulmonary arrest.     Louretta Shorten, MD 03-23-2013 [phone]

## 2013-03-18 DEATH — deceased

## 2013-10-17 IMAGING — CR DG CHEST 2V
1 series · 1 of 1 positions shown · non-contrast
Comparison: 06/08/2012

CLINICAL DATA: Shortness of breath.  Cough.  Fever.

CHEST - 2 VIEW

[w chest lat]
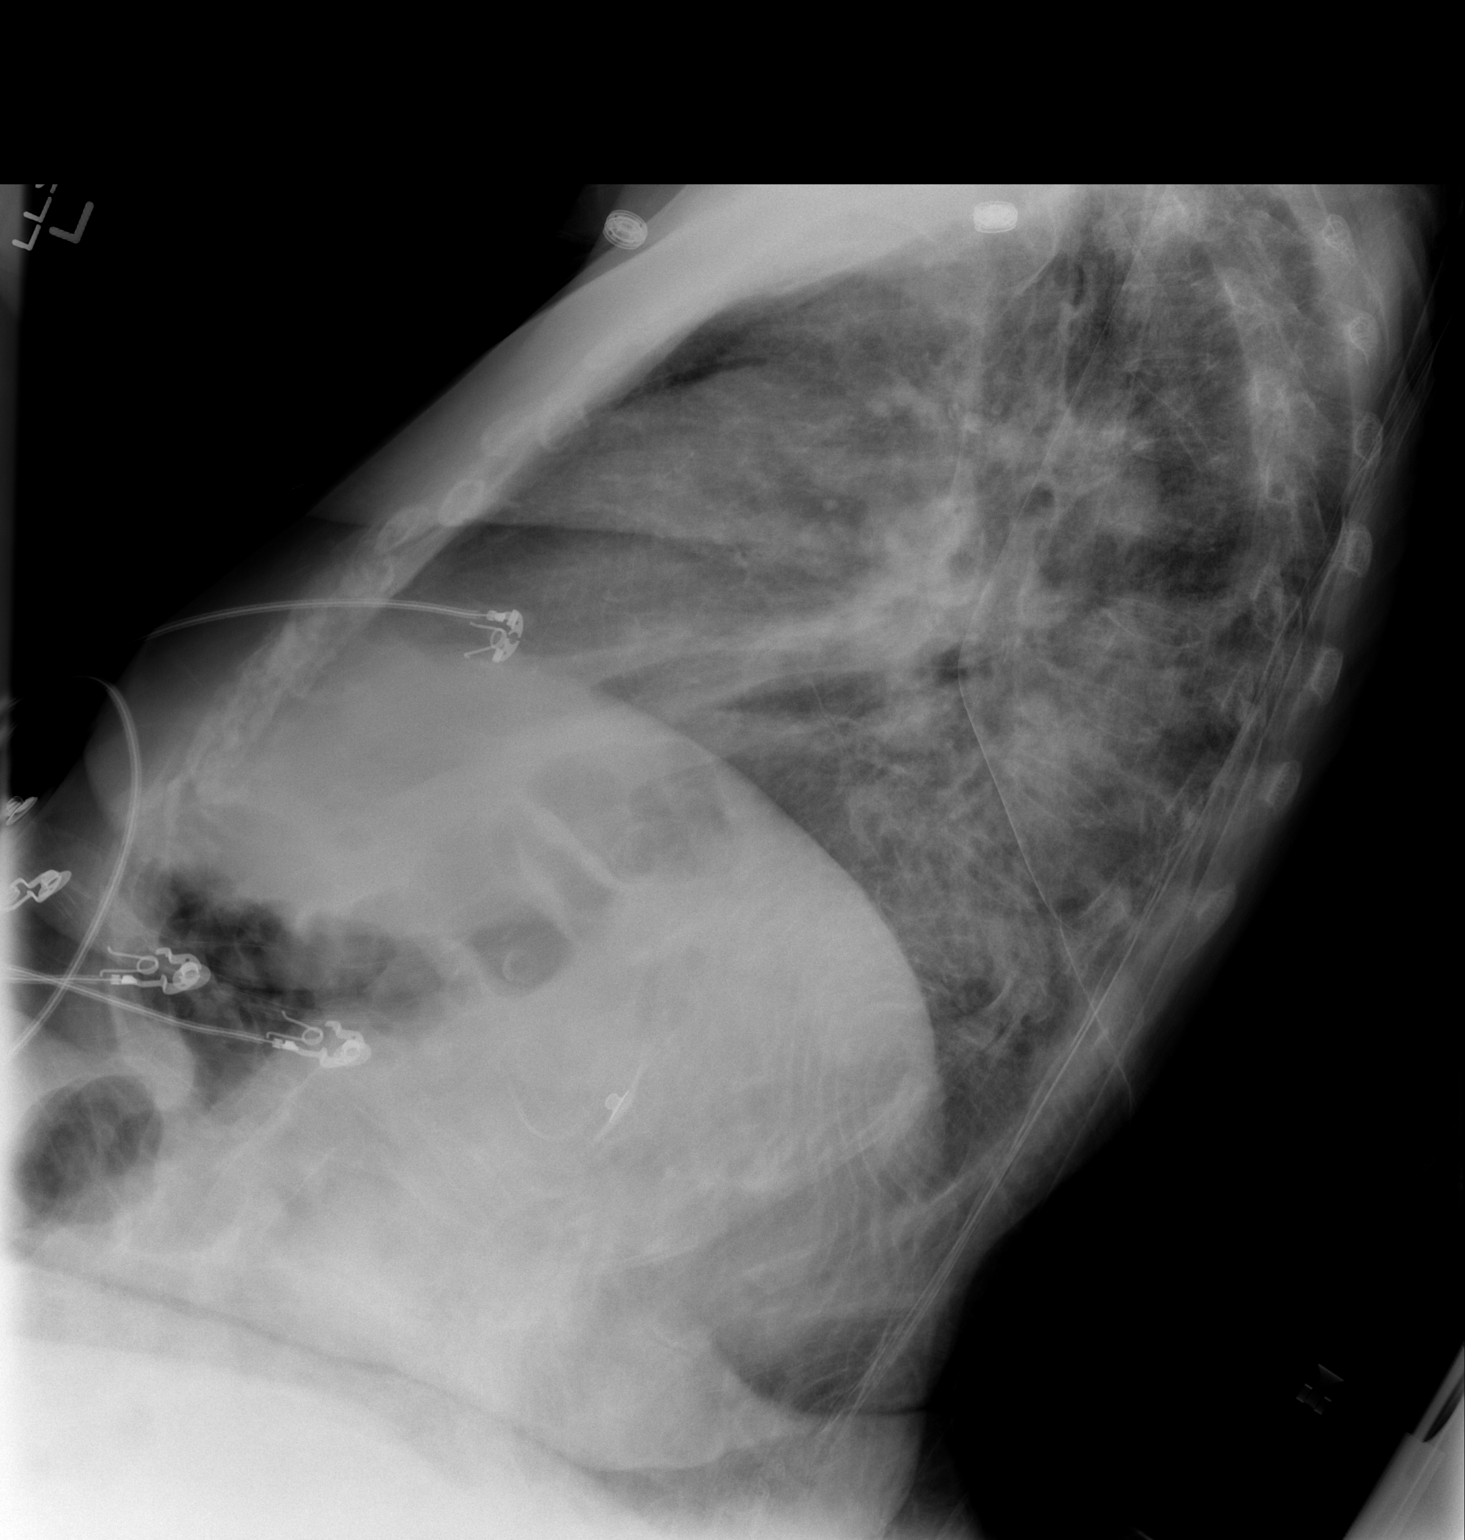

[1 of 1 positions shown; findings below may reference images not displayed]

FINDINGS: The patient is rotated to the right on today's exam,
resulting in reduced diagnostic sensitivity and specificity.
Indistinct airspace opacity noted in the left lung, particularly at
the left lower lobe and potentially the posterior lingula.

Interstitial accentuation is present bilaterally.

Borderline cardiomegaly.
IMPRESSION: 1.  Airspace opacity at the left lung base, including the left
lower lobe and likely the lingula, potentially from pneumonia or
asymmetric edema.  There is mild interstitial accentuation
bilaterally.
2.  Borderline cardiomegaly.
